# Patient Record
Sex: Female | Born: 1945
Health system: Southern US, Community
[De-identification: ages and names within clinical notes are randomized; demographics above are authoritative.]

## PROBLEM LIST (undated history)

## (undated) DIAGNOSIS — Z86010 Personal history of colonic polyps: Secondary | ICD-10-CM

## (undated) DIAGNOSIS — K760 Fatty (change of) liver, not elsewhere classified: Secondary | ICD-10-CM

## (undated) DIAGNOSIS — E785 Hyperlipidemia, unspecified: Secondary | ICD-10-CM

## (undated) DIAGNOSIS — M810 Age-related osteoporosis without current pathological fracture: Secondary | ICD-10-CM

## (undated) DIAGNOSIS — H269 Unspecified cataract: Secondary | ICD-10-CM

## (undated) DIAGNOSIS — I10 Essential (primary) hypertension: Secondary | ICD-10-CM

## (undated) DIAGNOSIS — K635 Polyp of colon: Secondary | ICD-10-CM

## (undated) HISTORY — PX: TUBAL LIGATION: SHX77

## (undated) HISTORY — PX: CHOLECYSTECTOMY: SHX55

## (undated) HISTORY — DX: Hyperlipidemia, unspecified: E78.5

## (undated) HISTORY — DX: Unspecified cataract: H26.9

## (undated) HISTORY — DX: Personal history of colonic polyps: Z86.010

## (undated) HISTORY — DX: Essential (primary) hypertension: I10

## (undated) HISTORY — DX: Age-related osteoporosis without current pathological fracture: M81.0

## (undated) HISTORY — DX: Polyp of colon: K63.5

## (undated) HISTORY — PX: ABDOMINAL HYSTERECTOMY: SHX81

---
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
## 2001-03-18 ENCOUNTER — Encounter: Payer: Self-pay | Admitting: Gastroenterology

## 2001-03-18 ENCOUNTER — Ambulatory Visit (HOSPITAL_COMMUNITY): Admission: RE | Admit: 2001-03-18 | Discharge: 2001-03-18 | Payer: Self-pay | Admitting: Gastroenterology

## 2010-07-05 ENCOUNTER — Other Ambulatory Visit (HOSPITAL_COMMUNITY): Payer: BC Managed Care – PPO

## 2010-07-06 ENCOUNTER — Encounter (HOSPITAL_COMMUNITY)
Admission: RE | Admit: 2010-07-06 | Discharge: 2010-07-06 | Disposition: A | Discharge status: Home / Self Care | Payer: BC Managed Care – PPO | Source: Ambulatory Visit | Attending: Obstetrics and Gynecology | Admitting: Obstetrics and Gynecology

## 2010-07-06 LAB — BASIC METABOLIC PANEL
BUN: 17 mg/dL (ref 6–23)
CO2: 28 mEq/L (ref 19–32)
Calcium: 9.9 mg/dL (ref 8.4–10.5)
Chloride: 104 mEq/L (ref 96–112)
Creatinine, Ser: 0.89 mg/dL (ref 0.4–1.2)
GFR calc Af Amer: >60 mL/min (ref >60)
GFR calc non Af Amer: >60 mL/min (ref >60)
Glucose, Bld: 99 mg/dL (ref 70–99)
Potassium: 4.2 mEq/L (ref 3.5–5.1)
Sodium: 141 mEq/L (ref 135–145)

## 2010-07-06 LAB — CBC
HCT: 42.7 % (ref 36.0–46.0)
Hemoglobin: 14.1 g/dL (ref 12.0–15.0)
MCH: 29.9 pg (ref 26.0–34.0)
MCHC: 33 g/dL (ref 30.0–36.0)
MCV: 90.5 fL (ref 78.0–100.0)
Platelets: 229 10*3/uL (ref 150–400)
RBC: 4.72 MIL/uL (ref 3.87–5.11)
RDW: 14.5 % (ref 11.5–15.5)
WBC: 6.8 10*3/uL (ref 4.0–10.5)

## 2010-07-06 LAB — SURGICAL PCR SCREEN
MRSA, PCR: NEGATIVE
Staphylococcus aureus: NEGATIVE

## 2010-07-11 ENCOUNTER — Other Ambulatory Visit: Payer: Self-pay | Admitting: Obstetrics and Gynecology

## 2010-07-11 ENCOUNTER — Ambulatory Visit (HOSPITAL_COMMUNITY)
Admission: RE | Admit: 2010-07-11 | Discharge: 2010-07-12 | Disposition: A | Discharge status: Home / Self Care | Payer: BC Managed Care – PPO | Source: Ambulatory Visit | Attending: Obstetrics and Gynecology | Admitting: Obstetrics and Gynecology

## 2010-07-11 DIAGNOSIS — I10 Essential (primary) hypertension: Secondary | ICD-10-CM | POA: Insufficient documentation

## 2010-07-11 DIAGNOSIS — D251 Intramural leiomyoma of uterus: Secondary | ICD-10-CM | POA: Insufficient documentation

## 2010-07-11 DIAGNOSIS — Z01812 Encounter for preprocedural laboratory examination: Secondary | ICD-10-CM | POA: Insufficient documentation

## 2010-07-11 DIAGNOSIS — E7800 Pure hypercholesterolemia, unspecified: Secondary | ICD-10-CM | POA: Insufficient documentation

## 2010-07-11 DIAGNOSIS — N840 Polyp of corpus uteri: Secondary | ICD-10-CM | POA: Insufficient documentation

## 2010-07-11 DIAGNOSIS — N812 Incomplete uterovaginal prolapse: Secondary | ICD-10-CM | POA: Insufficient documentation

## 2010-07-11 DIAGNOSIS — Z01818 Encounter for other preprocedural examination: Secondary | ICD-10-CM | POA: Insufficient documentation

## 2010-07-12 LAB — CBC
HCT: 32.7 % — ABNORMAL LOW (ref 36.0–46.0)
Hemoglobin: 10.6 g/dL — ABNORMAL LOW (ref 12.0–15.0)
MCH: 29 pg (ref 26.0–34.0)
MCHC: 32.4 g/dL (ref 30.0–36.0)
MCV: 89.3 fL (ref 78.0–100.0)
Platelets: 170 10*3/uL (ref 150–400)
RBC: 3.66 MIL/uL — ABNORMAL LOW (ref 3.87–5.11)
RDW: 14.6 % (ref 11.5–15.5)
WBC: 8.9 10*3/uL (ref 4.0–10.5)

## 2010-07-15 NOTE — Op Note (Signed)
NAMEDEVYNE, HAUGER                ACCOUNT NO.:  [id number]  MEDICAL RECORD NO.:  [id number]         PATIENT TYPE:  WINP  LOCATION:  319                           FACILITY:  [facility]  PHYSICIAN:  Odai Wimmer L. Bergen Magner, M.D.DATE OF BIRTH:  Jul 01, 1945  DATE OF PROCEDURE: DATE OF DISCHARGE:                              OPERATIVE REPORT   PREOPERATIVE DIAGNOSES:  Uterine prolapse, cystocele, and rectocele.  POSTOPERATIVE DIAGNOSES:  Uterine prolapse, cystocele, and rectocele.  PROCEDURE:  Total vaginal hysterectomy and anterior and posterior repair.  SURGEON:  Willian Donson L. Vincente Poli, MD.  ASSISTANT:  Dr. Jennette Kettle.  ANESTHESIA:  General.  FINDINGS:  Grade 2 cystocele, grade 2 uterine prolapse, and grade 1 to 2 rectocele.  SPECIMENS:  Uterus and cervix sent to pathology.  ESTIMATED BLOOD LOSS:  200 mL.  COMPLICATIONS:  None.  DESCRIPTION OF PROCEDURE:  The patient was taken to the operating room. She was intubated.  She was prepped and draped in usual sterile fashion. A Foley catheter was inserted.  Exam under anesthesia revealed she had a grade 2 cystocele, grade 2 uterine prolapse, and grade 1 to rectocele. A weighted speculum was placed in the vagina.  The cervix was grasped with a tenaculum.  A circumferential incision was made around the cervix.  The posterior cul-de-sac was entered sharply using Mayo scissors and the anterior cul-de-sac was entered sharply using Metzenbaum scissors.  A weighted speculum was placed in the cul-de-sac using curved Heaney clamps.  The curved Heaney clamp was placed across the uterosacral cardinal ligaments on either side.  Each pedicle was clamped, cut, and suture ligated using 0-Vicryl suture.  We walked our way up the broad ligament staying very snug against the cervix and the uterus.  Each pedicle was clamped, cut, and suture ligated using 0- Vicryl suture.  We then reached the level of the fundus.  The uterus was retroflexed.  The remainder of the  broad ligament clamped on either side using curved Heaney clamps.  The pedicles were secured using a free tie of 0-Vicryl suture.  The ovaries were very high in the pelvis and I inspected then visually they appeared to be very normal.  We were not able to remove them vaginally.  Hemostasis was excellent.  We closed the posterior cuff using 0-Vicryl suture in a running stitch and the posterior two thirds of the cuff was closed completely anterior to posterior using 0-Vicryl suture.  At this point, we could still see that she had a cystocele, so I just proceeded with an anterior repair, of course she had excellent support in the vaginal wall, so she did not need a sacrospinous ligament fixation.  I placed Allis clamps on either side of the vaginal apex and made a midline incision over the anterior vaginal wall and basically dissected the vesicovaginal fascia away from the overlying epithelium with Metzenbaum scissors.  The cystocele was reduced nicely using sharp and blunt dissection.  We then closed and reduced the cystocele with a pursestring suture using 0-Vicryl suture. I then trimmed the redundant vaginal epithelium and closed the incision anterior to posterior with excellent hemostasis with 0-Vicryl suture, and  the suture approximated and met the area of the vaginal cuff with the previous cuff closure.  At this point, we then inspected the posterior vaginal wall and she had a small rectocele.  She also had a little bit to gaping perineum, so I made a V-shaped incision at the introitus and made a midline incision up about half way up the posterior vaginal wall and then dissected the rectovaginal fascia free posteriorly and reduced the rectocele using interrupted 0-Vicryl suture in standard fashion.  We then trimmed the redundant epithelium and closed the vaginal epithelium incision with 0 Vicryl in a running locked stitch. Hemostasis was excellent.  All sponge, lap, and instrument  counts were correct x2.  Vaginal packing with Estrace cream was inserted to the vagina and will be removed in the morning.  Drains, Foley and vaginal packing.  Pathology, uterus and cervix sent to pathology.  EBL 200 mL. Complications, none.  The patient was taken to recovery room and extubated in stable condition.     Darry Kelnhofer L. Vincente Poli, M.D.     Florestine Avers  D:  07/11/2010  T:  07/11/2010  Job:  [id number]  Electronically Signed by Marcelle Overlie M.D. on 07/15/2010 08:20:18 AM

## 2012-07-06 HISTORY — PX: GALLBLADDER SURGERY: SHX652

## 2013-05-27 DIAGNOSIS — Z0000 Encounter for general adult medical examination without abnormal findings: Secondary | ICD-10-CM | POA: Insufficient documentation

## 2013-05-27 DIAGNOSIS — I10 Essential (primary) hypertension: Secondary | ICD-10-CM | POA: Insufficient documentation

## 2013-05-27 DIAGNOSIS — E1159 Type 2 diabetes mellitus with other circulatory complications: Secondary | ICD-10-CM | POA: Insufficient documentation

## 2013-05-29 DIAGNOSIS — G4733 Obstructive sleep apnea (adult) (pediatric): Secondary | ICD-10-CM | POA: Insufficient documentation

## 2013-06-18 DIAGNOSIS — K635 Polyp of colon: Secondary | ICD-10-CM | POA: Insufficient documentation

## 2013-06-18 DIAGNOSIS — K5790 Diverticulosis of intestine, part unspecified, without perforation or abscess without bleeding: Secondary | ICD-10-CM | POA: Insufficient documentation

## 2013-06-18 DIAGNOSIS — Z860101 Personal history of adenomatous and serrated colon polyps: Secondary | ICD-10-CM

## 2013-06-18 DIAGNOSIS — Z86010 Personal history of colonic polyps: Secondary | ICD-10-CM | POA: Insufficient documentation

## 2013-06-18 HISTORY — DX: Diverticulosis of intestine, part unspecified, without perforation or abscess without bleeding: K57.90

## 2013-06-18 HISTORY — DX: Personal history of colonic polyps: Z86.010

## 2013-06-18 HISTORY — DX: Polyp of colon: K63.5

## 2013-06-18 HISTORY — PX: COLONOSCOPY: SHX174

## 2013-06-18 HISTORY — DX: Personal history of adenomatous and serrated colon polyps: Z86.0101

## 2014-07-29 ENCOUNTER — Ambulatory Visit: Payer: BC Managed Care – PPO | Admitting: Internal Medicine

## 2015-06-11 LAB — TSH: TSH: 1.6 u[IU]/mL (ref <=5.90)

## 2015-06-11 LAB — LIPID PANEL
Cholesterol: 191 mg/dL (ref 0–200)
HDL: 53 mg/dL (ref 35–70)
LDL Cholesterol: 124 mg/dL
Triglycerides: 72 mg/dL (ref 40–160)

## 2015-06-11 LAB — HEPATIC FUNCTION PANEL
ALT: 33 U/L (ref 7–35)
AST: 36 U/L — AB (ref 13–35)
Alkaline Phosphatase: 49 U/L (ref 25–125)
Bilirubin, Total: 0.4 mg/dL

## 2015-06-11 LAB — BASIC METABOLIC PANEL
BUN: 20 mg/dL (ref 4–21)
Creatinine: 1 mg/dL (ref <=1.1)
Glucose: 90 mg/dL
Potassium: 4.1 mmol/L (ref 3.4–5.3)
Sodium: 145 mmol/L (ref 137–147)

## 2015-06-11 LAB — CBC AND DIFFERENTIAL
HCT: 39 % (ref 36–46)
Hemoglobin: 13 g/dL (ref 12.0–16.0)
Platelets: 293 10*3/uL (ref 150–399)
WBC: 5.2 10^3/mL

## 2015-07-12 ENCOUNTER — Ambulatory Visit: Payer: BC Managed Care – PPO | Admitting: Internal Medicine

## 2015-08-19 ENCOUNTER — Ambulatory Visit: Payer: BC Managed Care – PPO | Admitting: Internal Medicine

## 2015-09-15 LAB — HM DEXA SCAN

## 2015-09-16 LAB — HM MAMMOGRAPHY

## 2015-10-28 ENCOUNTER — Ambulatory Visit (INDEPENDENT_AMBULATORY_CARE_PROVIDER_SITE_OTHER): Payer: Medicare Other | Admitting: Internal Medicine

## 2015-10-28 ENCOUNTER — Encounter: Payer: Self-pay | Admitting: Internal Medicine

## 2015-10-28 VITALS — BP 134/82 | HR 69 | Temp 97.9°F | Resp 16 | Ht 62.0 in | Wt 134.0 lb

## 2015-10-28 DIAGNOSIS — E785 Hyperlipidemia, unspecified: Secondary | ICD-10-CM | POA: Insufficient documentation

## 2015-10-28 DIAGNOSIS — E559 Vitamin D deficiency, unspecified: Secondary | ICD-10-CM | POA: Diagnosis not present

## 2015-10-28 DIAGNOSIS — K760 Fatty (change of) liver, not elsewhere classified: Secondary | ICD-10-CM | POA: Insufficient documentation

## 2015-10-28 DIAGNOSIS — E1169 Type 2 diabetes mellitus with other specified complication: Secondary | ICD-10-CM | POA: Insufficient documentation

## 2015-10-28 DIAGNOSIS — H269 Unspecified cataract: Secondary | ICD-10-CM

## 2015-10-28 DIAGNOSIS — I10 Essential (primary) hypertension: Secondary | ICD-10-CM

## 2015-10-28 NOTE — Assessment & Plan Note (Signed)
BP well controlled Current regimen effective and well tolerated Continue current medications at current doses cmp done a couple of months ago F/u in one year

## 2015-10-28 NOTE — Patient Instructions (Addendum)
   All other Health Maintenance issues reviewed.   All recommended immunizations and age-appropriate screenings are up-to-date or discussed.  No immunizations administered today.   Medications reviewed and updated.  No changes recommended at this time.   Please followup in one year

## 2015-10-28 NOTE — Assessment & Plan Note (Signed)
lfts done 4 months ago Work on weight loss

## 2015-10-28 NOTE — Assessment & Plan Note (Signed)
Lipid panel controlled Continue current medication 

## 2015-10-28 NOTE — Progress Notes (Signed)
Pre visit review using our clinic review tool, if applicable. No additional management support is needed unless otherwise documented below in the visit note. 

## 2015-10-28 NOTE — Progress Notes (Signed)
Subjective:    Patient ID: Morgan Hancock, female    DOB: Feb 05, 1946, 70 y.o.   MRN: [id number]  HPI She is here to establish with a new pcp.    Three weeks ago she started having neck and right shoulder discomfort after washing her Lucianne Lei with a tall brush.  She saw a chiropractor 5 times.  It is better, but still there.  She saw her old NP and she was given flexeril and naproxen.  She got a full body massage.  She still has some pain.  Her hand is numb on the medial aspect from her wrist to tip of finger.  The pain radiates from the shoulder blade down her arm.  Her neck is not stiff, but it does hurt to bend her neck forward. She is taking the flexeril.  Heat helps.  If she gets in certain positions she will get relief.     Hypertension: She is taking her medication daily. She is compliant with a low sodium diet.  She denies chest pain, palpitations, edema, shortness of breath and regular headaches. She is exercising regularly.  She does not monitor her blood pressure at home.    Hyperlipidemia: She is taking her medication daily. She is compliant with a low fat/cholesterol diet. She is exercising regularly. She denies myalgias.    Medications and allergies reviewed with patient and updated if appropriate.  Patient Active Problem List   Diagnosis Date Noted  . HLD (hyperlipidemia) 10/28/2015  . Avitaminosis D 10/28/2015  . DD (diverticular disease) 06/18/2013  . Colon polyp 06/18/2013  . Obstructive apnea 05/29/2013  . BP (high blood pressure) 05/27/2013    No current outpatient prescriptions on file prior to visit.   No current facility-administered medications on file prior to visit.    Past Medical History  Diagnosis Date  . Hyperlipidemia   . Hypertension     Past Surgical History  Procedure Laterality Date  . Gallbladder surgery  [id number]  . Tonsillectomy  03/12/1952  . Abdominal hysterectomy      Social History   Social History  . Marital Status: Married   Spouse Name: N/A  . Number of Children: N/A  . Years of Education: N/A   Social History Main Topics  . Smoking status: Never Smoker   . Smokeless tobacco: Never Used  . Alcohol Use: No  . Drug Use: No  . Sexual Activity: Not Asked   Other Topics Concern  . None   Social History Narrative  . None    Family History  Problem Relation Age of Onset  . Hyperlipidemia Mother   . Heart disease Father   . Diabetes Father   . Parkinson's disease Father   . Hyperlipidemia Brother   . Hypertension Brother   . Miscarriages / Stillbirths Maternal Grandmother   . Miscarriages / Stillbirths Paternal Grandfather     Review of Systems  Constitutional: Negative for fever, chills, appetite change and fatigue.  Eyes: Negative for visual disturbance.  Respiratory: Negative for cough, shortness of breath and wheezing.   Cardiovascular: Negative for chest pain, palpitations and leg swelling.  Gastrointestinal: Negative for nausea, abdominal pain, diarrhea, constipation and blood in stool.       No gerd  Genitourinary: Negative for dysuria and hematuria.  Musculoskeletal: Positive for myalgias (right arm, upper back). Negative for back pain.  Skin: Negative for color change and rash.       Sees derm annually  Neurological: Negative for dizziness, light-headedness and  headaches.  Psychiatric/Behavioral: Negative for dysphoric mood. The patient is not nervous/anxious.        Objective:   Filed Vitals:   10/28/15 0859  BP: 134/82  Pulse: 69  Temp: 97.9 F (36.6 C)  Resp: 16   Filed Weights   10/28/15 0859  Weight: 134 lb (60.782 kg)   Body mass index is 24.5 kg/(m^2).   Physical Exam Constitutional: She appears well-developed and well-nourished. No distress.  HENT:  Head: Normocephalic and atraumatic.  Right Ear: External ear normal. Normal ear canal and TM Left Ear: External ear normal.  Normal ear canal and TM Mouth/Throat: Oropharynx is clear and moist.  Eyes: Conjunctivae  are normal.  Neck: Neck supple. No tracheal deviation present. No thyromegaly present.  No carotid bruit  Cardiovascular: Normal rate, regular rhythm and normal heart sounds.   No murmur heard.  No edema. Pulmonary/Chest: Effort normal and breath sounds normal. No respiratory distress. She has no wheezes. She has no rales.  Abdominal: Soft. She exhibits no distension. There is no tenderness.  Lymphadenopathy: She has no cervical adenopathy.  Skin: Skin is warm and dry. She is not diaphoretic.  Psychiatric: She has a normal mood and affect. Her behavior is normal.       Assessment & Plan:   See Problem List for Assessment and Plan of chronic medical problems.   F/u annually

## 2015-10-29 ENCOUNTER — Encounter: Payer: Self-pay | Admitting: Internal Medicine

## 2015-10-29 DIAGNOSIS — M8580 Other specified disorders of bone density and structure, unspecified site: Secondary | ICD-10-CM | POA: Insufficient documentation

## 2015-10-29 DIAGNOSIS — M810 Age-related osteoporosis without current pathological fracture: Secondary | ICD-10-CM | POA: Insufficient documentation

## 2015-10-29 HISTORY — DX: Age-related osteoporosis without current pathological fracture: M81.0

## 2015-11-05 ENCOUNTER — Encounter: Payer: Self-pay | Admitting: Internal Medicine

## 2015-11-20 ENCOUNTER — Encounter: Payer: Self-pay | Admitting: Internal Medicine

## 2015-11-20 DIAGNOSIS — N6019 Diffuse cystic mastopathy of unspecified breast: Secondary | ICD-10-CM | POA: Insufficient documentation

## 2015-11-20 HISTORY — DX: Diffuse cystic mastopathy of unspecified breast: N60.19

## 2016-02-08 ENCOUNTER — Encounter: Payer: Self-pay | Admitting: Internal Medicine

## 2016-02-08 ENCOUNTER — Ambulatory Visit (INDEPENDENT_AMBULATORY_CARE_PROVIDER_SITE_OTHER): Payer: Medicare Other | Admitting: Internal Medicine

## 2016-02-08 ENCOUNTER — Other Ambulatory Visit (INDEPENDENT_AMBULATORY_CARE_PROVIDER_SITE_OTHER): Payer: Medicare Other

## 2016-02-08 VITALS — BP 140/38 | HR 75 | Temp 98.0°F | Resp 16 | Wt 143.0 lb

## 2016-02-08 DIAGNOSIS — R1012 Left upper quadrant pain: Secondary | ICD-10-CM

## 2016-02-08 LAB — CBC WITH DIFFERENTIAL/PLATELET
Basophils Absolute: 0.1 10*3/uL (ref 0.0–0.1)
Basophils Relative: 0.7 % (ref 0.0–3.0)
Eosinophils Absolute: 0.5 10*3/uL (ref 0.0–0.7)
Eosinophils Relative: 7 % — ABNORMAL HIGH (ref 0.0–5.0)
HCT: 40.2 % (ref 36.0–46.0)
Hemoglobin: 13.4 g/dL (ref 12.0–15.0)
Lymphocytes Relative: 29.4 % (ref 12.0–46.0)
Lymphs Abs: 2.1 10*3/uL (ref 0.7–4.0)
MCHC: 33.4 g/dL (ref 30.0–36.0)
MCV: 89.2 fl (ref 78.0–100.0)
Monocytes Absolute: 0.6 10*3/uL (ref 0.1–1.0)
Monocytes Relative: 8 % (ref 3.0–12.0)
Neutro Abs: 4 10*3/uL (ref 1.4–7.7)
Neutrophils Relative %: 54.9 % (ref 43.0–77.0)
Platelets: 278 10*3/uL (ref 150.0–400.0)
RBC: 4.5 Mil/uL (ref 3.87–5.11)
RDW: 14.8 % (ref 11.5–15.5)
WBC: 7.2 10*3/uL (ref 4.0–10.5)

## 2016-02-08 MED ORDER — OMEPRAZOLE 20 MG PO CPDR: 20.0000 mg | DELAYED_RELEASE_CAPSULE | Freq: Every day | ORAL | 3 refills | Status: DC

## 2016-02-08 NOTE — Assessment & Plan Note (Signed)
Present for 2 months, getting worse Concern for atypical heartburn, pancreatic disorder Check CBC, CMP, amylase and lipase CT of the abdomen and pelvis ordered Start omeprazole 20 mg daily She will call if her symptoms worsen Further evaluation and referral depending on above

## 2016-02-08 NOTE — Progress Notes (Signed)
Subjective:    Patient ID: Morgan Hancock, female    DOB: 11-Dec-1945, 70 y.o.   MRN: [id number]  HPI She is here for an acute visit.  2 months ago she started experiencing left upper quadrant pain that radiated to her right upper abdomen. She also has had intermittent pain near her right shoulder blade. She has had her gallbladder removed and this pain reminded her of her gallbladder pain. The pain is intermittent. It has gotten worse. For the past 3-4 weeks has been waking her up at night. The pain is worse after eating, but not related to any specific foods. She has been experiencing increased gas. She denies any history of heartburn does not feel like she is having heartburn. She does feel like she has increased gas. Her appetite is slightly decreased, but her weight has not changed.  She denies any urinary symptoms, fevers, chest pain, shortness of breath, cough or wheeze. Her bowel movements have been normal and she denies any blood in the stool.    Medications and allergies reviewed with patient and updated if appropriate.  Patient Active Problem List   Diagnosis Date Noted  . Fibrocystic breast disease 11/20/2015  . Osteopenia 10/29/2015  . HLD (hyperlipidemia) 10/28/2015  . Fatty liver 10/28/2015  . Vitamin D deficiency 10/28/2015  . Cataract 10/28/2015  . DD (diverticular disease) 06/18/2013  . Colon polyp 06/18/2013  . BP (high blood pressure) 05/27/2013    Current Outpatient Prescriptions on File Prior to Visit  Medication Sig Dispense Refill  . aspirin 81 MG tablet Take 81 mg by mouth daily.    Marland Kitchen atenolol (TENORMIN) 50 MG tablet Take 50 mg by mouth daily.    Marland Kitchen CALCIUM-VITAMIN D PO Take 1,200 mg by mouth.    . ergocalciferol (VITAMIN D2) 50000 units capsule Take 50,000 Units by mouth every 30 (thirty) days.    . FENOFIBRATE PO Take 145 mg by mouth daily.    . Omega-3 Fatty Acids (FISH OIL) 1000 MG CAPS Take 1,000 mg by mouth daily.     No current  facility-administered medications on file prior to visit.     Past Medical History:  Diagnosis Date  . Hyperlipidemia   . Hypertension     Past Surgical History:  Procedure Laterality Date  . ABDOMINAL HYSTERECTOMY     prolapse  . GALLBLADDER SURGERY  [id number]  . TONSILLECTOMY  03/12/1952    Social History   Social History  . Marital status: Married    Spouse name: N/A  . Number of children: N/A  . Years of education: N/A   Social History Main Topics  . Smoking status: Never Smoker  . Smokeless tobacco: Never Used  . Alcohol use No  . Drug use: No  . Sexual activity: Not on file   Other Topics Concern  . Not on file   Social History Narrative   Exercise: gardening, mows lawn, [place] [facility] [employer] [facility]    Family History  Problem Relation Age of Onset  . Hyperlipidemia Mother   . Heart disease Father   . Diabetes Father   . Parkinson's disease Father   . Hyperlipidemia Brother   . Hypertension Brother   . Miscarriages / Stillbirths Maternal Grandmother   . Miscarriages / Stillbirths Paternal Grandfather     Review of Systems  Constitutional: Positive for appetite change (less hungry - mild). Negative for fever and unexpected weight change.  Respiratory: Negative for cough, shortness of breath and wheezing.  Cardiovascular: Negative for chest pain, palpitations and leg swelling.  Gastrointestinal: Positive for abdominal pain. Negative for blood in stool, constipation, diarrhea and nausea.       No gerd  Genitourinary: Negative for dysuria and hematuria.  Skin: Negative for rash.  Neurological: Negative for light-headedness and headaches.       Objective:   Vitals:   02/08/16 1612  BP: (!) 140/38  Pulse: 75  Resp: 16  Temp: 98 F (36.7 C)   Filed Weights   02/08/16 1612  Weight: 143 lb (64.9 kg)   Body mass index is 26.16 kg/m.   Physical Exam  Constitutional: She appears well-developed and well-nourished. No distress.  HENT:  Head:  Normocephalic and atraumatic.  Eyes: Conjunctivae are normal.  Cardiovascular: Normal rate, regular rhythm and normal heart sounds.   No murmur heard. Pulmonary/Chest: Effort normal and breath sounds normal. No respiratory distress. She has no wheezes. She has no rales.  Abdominal: Soft. Bowel sounds are normal. She exhibits no distension and no mass. There is tenderness (Left upper quadrant without rebound or guarding). There is no rebound and no guarding.  Musculoskeletal: She exhibits no edema.  Skin: Skin is warm. No rash noted. She is not diaphoretic.          Assessment & Plan:   See Problem List for Assessment and Plan of chronic medical problems.

## 2016-02-08 NOTE — Progress Notes (Signed)
Pre visit review using our clinic review tool, if applicable. No additional management support is needed unless otherwise documented below in the visit note. 

## 2016-02-08 NOTE — Patient Instructions (Signed)
  Test(s) ordered today. Your results will be released to [place] (or called to you) after review, usually within 72hours after test completion. If any changes need to be made, you will be notified at that same time.   Medications reviewed and updated.  Changes include starting omeprazole for your stomach pain.   Your prescription(s) have been submitted to your pharmacy. Please take as directed and contact our office if you believe you are having problem(s) with the medication(s).  A ct scan of your abdomen was ordered - we will cal you to schedule this.

## 2016-02-09 ENCOUNTER — Ambulatory Visit (INDEPENDENT_AMBULATORY_CARE_PROVIDER_SITE_OTHER)
Admission: RE | Admit: 2016-02-09 | Discharge: 2016-02-09 | Disposition: A | Discharge status: Home / Self Care | Payer: Medicare Other | Source: Ambulatory Visit | Attending: Internal Medicine | Admitting: Internal Medicine

## 2016-02-09 ENCOUNTER — Other Ambulatory Visit: Payer: Self-pay | Admitting: Internal Medicine

## 2016-02-09 ENCOUNTER — Telehealth: Payer: Self-pay | Admitting: Internal Medicine

## 2016-02-09 DIAGNOSIS — R1012 Left upper quadrant pain: Secondary | ICD-10-CM | POA: Diagnosis not present

## 2016-02-09 LAB — COMPREHENSIVE METABOLIC PANEL
ALT: 26 U/L (ref 0–35)
AST: 26 U/L (ref 0–37)
Albumin: 4.1 g/dL (ref 3.5–5.2)
Alkaline Phosphatase: 36 U/L — ABNORMAL LOW (ref 39–117)
BUN: 21 mg/dL (ref 6–23)
CO2: 27 mEq/L (ref 19–32)
Calcium: 9.5 mg/dL (ref 8.4–10.5)
Chloride: 105 mEq/L (ref 96–112)
Creatinine, Ser: 0.99 mg/dL (ref 0.40–1.20)
GFR: 58.9 mL/min — ABNORMAL LOW (ref >60.00)
Glucose, Bld: 102 mg/dL — ABNORMAL HIGH (ref 70–99)
Potassium: 4 mEq/L (ref 3.5–5.1)
Sodium: 141 mEq/L (ref 135–145)
Total Bilirubin: 0.3 mg/dL (ref 0.2–1.2)
Total Protein: 7.5 g/dL (ref 6.0–8.3)

## 2016-02-09 LAB — LIPASE: Lipase: 37 U/L (ref 11.0–59.0)

## 2016-02-09 MED ORDER — IOPAMIDOL (ISOVUE-300) INJECTION 61%
100.0000 mL | Freq: Once | INTRAVENOUS | Status: AC | PRN
  Administered 2016-02-09: 100 mL via INTRAVENOUS

## 2016-02-09 NOTE — Telephone Encounter (Signed)
Her blood work is unremarkable and her ct scan showed no cause for her pain, which is good new.  She dose have some plaque build up in her arteries and mild fatty liver.   Has there been any improvement in her pain with the omeprazole?  She may want to try taking one twice a day 30 minutes prior to meals for a week to see if that helps.  If no improvement she needs to let us know.

## 2016-02-10 NOTE — Telephone Encounter (Signed)
LVM for pt to call back.

## 2016-02-10 NOTE — Telephone Encounter (Signed)
Pt called you back. Please call back when you get a chance thanks.

## 2016-02-10 NOTE — Telephone Encounter (Signed)
Spoke with pt, she states she has only taken the Omeprazole one time. She will try for a week and let us know if things are no changes.

## 2016-02-10 NOTE — Telephone Encounter (Signed)
Patient called back again to speak with Lovena Le. Please follow up with after 4pm she asked. Thank you.

## 2016-12-28 LAB — HM MAMMOGRAPHY

## 2017-04-18 ENCOUNTER — Ambulatory Visit: Payer: Medicare Other | Admitting: Internal Medicine

## 2017-04-18 ENCOUNTER — Other Ambulatory Visit (INDEPENDENT_AMBULATORY_CARE_PROVIDER_SITE_OTHER): Payer: Medicare Other

## 2017-04-18 ENCOUNTER — Encounter: Payer: Self-pay | Admitting: Internal Medicine

## 2017-04-18 DIAGNOSIS — R1031 Right lower quadrant pain: Secondary | ICD-10-CM

## 2017-04-18 LAB — CBC WITH DIFFERENTIAL/PLATELET
Basophils Absolute: 0 10*3/uL (ref 0.0–0.1)
Basophils Relative: 0.7 % (ref 0.0–3.0)
Eosinophils Absolute: 0.3 10*3/uL (ref 0.0–0.7)
Eosinophils Relative: 5 % (ref 0.0–5.0)
HCT: 41.4 % (ref 36.0–46.0)
Hemoglobin: 13.7 g/dL (ref 12.0–15.0)
Lymphocytes Relative: 27.5 % (ref 12.0–46.0)
Lymphs Abs: 1.7 10*3/uL (ref 0.7–4.0)
MCHC: 33.2 g/dL (ref 30.0–36.0)
MCV: 90.8 fl (ref 78.0–100.0)
Monocytes Absolute: 0.4 10*3/uL (ref 0.1–1.0)
Monocytes Relative: 7.4 % (ref 3.0–12.0)
Neutro Abs: 3.6 10*3/uL (ref 1.4–7.7)
Neutrophils Relative %: 59.4 % (ref 43.0–77.0)
Platelets: 276 10*3/uL (ref 150.0–400.0)
RBC: 4.56 Mil/uL (ref 3.87–5.11)
RDW: 14.6 % (ref 11.5–15.5)
WBC: 6 10*3/uL (ref 4.0–10.5)

## 2017-04-18 LAB — COMPREHENSIVE METABOLIC PANEL
ALT: 30 U/L (ref 0–35)
AST: 29 U/L (ref 0–37)
Albumin: 4.3 g/dL (ref 3.5–5.2)
Alkaline Phosphatase: 46 U/L (ref 39–117)
BUN: 20 mg/dL (ref 6–23)
CO2: 28 mEq/L (ref 19–32)
Calcium: 9.7 mg/dL (ref 8.4–10.5)
Chloride: 104 mEq/L (ref 96–112)
Creatinine, Ser: 1.05 mg/dL (ref 0.40–1.20)
GFR: 54.85 mL/min — ABNORMAL LOW (ref >60.00)
Glucose, Bld: 109 mg/dL — ABNORMAL HIGH (ref 70–99)
Potassium: 3.8 mEq/L (ref 3.5–5.1)
Sodium: 142 mEq/L (ref 135–145)
Total Bilirubin: 0.6 mg/dL (ref 0.2–1.2)
Total Protein: 7.4 g/dL (ref 6.0–8.3)

## 2017-04-18 LAB — URINALYSIS, ROUTINE W REFLEX MICROSCOPIC
Bilirubin Urine: NEGATIVE
Hgb urine dipstick: NEGATIVE
Ketones, ur: NEGATIVE
Nitrite: NEGATIVE
RBC / HPF: NONE SEEN (ref 0–?)
Specific Gravity, Urine: 1.015 (ref 1.000–1.030)
Total Protein, Urine: NEGATIVE
Urine Glucose: NEGATIVE
Urobilinogen, UA: 0.2 (ref 0.0–1.0)
pH: 8 (ref 5.0–8.0)

## 2017-04-18 LAB — LIPID PANEL
Cholesterol: 187 mg/dL (ref 0–200)
HDL: 46.1 mg/dL (ref >39.00)
LDL Cholesterol: 120 mg/dL — ABNORMAL HIGH (ref 0–99)
NonHDL: 140.83
Total CHOL/HDL Ratio: 4
Triglycerides: 102 mg/dL (ref 0.0–149.0)
VLDL: 20.4 mg/dL (ref 0.0–40.0)

## 2017-04-18 NOTE — Progress Notes (Signed)
Subjective:    Patient ID: Morgan Hancock, female    DOB: November 12, 1945, 71 y.o.   MRN: [id number]  HPI She is here for an acute visit.   Right sided abdominal pain:  It started 3-4 months ago.  It was initially mild and intermittent.  The pain has increased in frequency.  It is more of a burning type pain than a stabbing-like pain.  It is at worst a 3/10.  At night she notices it more if she lays a certain way in bed.   If she lays a certain way it pulls.  At times the pain radiates toward her hip.  She is s/p hysterectomy around 2014 or 2015.  She still has her ovaries.  She has a blood test for ovarian cancer yearly and this year it was normal.  She denies any changes in her bowels, diarrhea, constipation or blood in the stool.  She denies any fevers or chills.  She denies painful urination, blood in the urine or frequent urination.  She denies back pain.  She does not experience any numbness or tingling.    Medications and allergies reviewed with patient and updated if appropriate.  Patient Active Problem List   Diagnosis Date Noted  . LUQ pain 02/08/2016  . Fibrocystic breast disease 11/20/2015  . Osteopenia 10/29/2015  . HLD (hyperlipidemia) 10/28/2015  . Fatty liver 10/28/2015  . Vitamin D deficiency 10/28/2015  . Cataract 10/28/2015  . DD (diverticular disease) 06/18/2013  . Colon polyp 06/18/2013  . BP (high blood pressure) 05/27/2013    Current Outpatient Medications on File Prior to Visit  Medication Sig Dispense Refill  . aspirin 81 MG tablet Take 81 mg by mouth daily.    Marland Kitchen atenolol (TENORMIN) 50 MG tablet Take 50 mg by mouth daily.    Marland Kitchen BIOTIN PO Take by mouth daily.    Marland Kitchen CALCIUM-VITAMIN D PO Take 1,200 mg by mouth.    . ergocalciferol (VITAMIN D2) 50000 units capsule Take 50,000 Units by mouth every 30 (thirty) days.    . FENOFIBRATE PO Take 145 mg by mouth daily.    . Omega-3 Fatty Acids (FISH OIL) 1000 MG CAPS Take 1,000 mg by mouth daily.    Marland Kitchen omeprazole  (PRILOSEC) 20 MG capsule Take 1 capsule (20 mg total) by mouth daily. 30 capsule 3   No current facility-administered medications on file prior to visit.     Past Medical History:  Diagnosis Date  . Hyperlipidemia   . Hypertension     Past Surgical History:  Procedure Laterality Date  . ABDOMINAL HYSTERECTOMY     prolapse  . GALLBLADDER SURGERY  [id number]  . TONSILLECTOMY  03/12/1952    Social History   Socioeconomic History  . Marital status: Married    Spouse name: None  . Number of children: None  . Years of education: None  . Highest education level: None  Social Needs  . Financial resource strain: None  . Food insecurity - worry: None  . Food insecurity - inability: None  . Transportation needs - medical: None  . Transportation needs - non-medical: None  Occupational History  . None  Tobacco Use  . Smoking status: Never Smoker  . Smokeless tobacco: Never Used  Substance and Sexual Activity  . Alcohol use: No  . Drug use: No  . Sexual activity: None  Other Topics Concern  . None  Social History Narrative   Exercise: gardening, mows lawn, [facility] [employer] in fall-  winter    Family History  Problem Relation Age of Onset  . Hyperlipidemia Mother   . Heart disease Father   . Diabetes Father   . Parkinson's disease Father   . Hyperlipidemia Brother   . Hypertension Brother   . Miscarriages / Stillbirths Maternal Grandmother   . Miscarriages / Stillbirths Paternal Grandfather     Review of Systems  Constitutional: Negative for chills and fever.  Gastrointestinal: Positive for abdominal pain (RLQ) and anal bleeding (hemorrhoids). Negative for abdominal distention, blood in stool, constipation, diarrhea and nausea.  Genitourinary: Positive for urgency. Negative for dysuria, frequency and hematuria.  Musculoskeletal: Negative for back pain.  Neurological: Negative for numbness.       Objective:   Vitals:   04/18/17 1038  BP: (!) 144/78  Pulse: 69  Resp:  16  Temp: 98.4 F (36.9 C)  SpO2: 97%   Wt Readings from Last 3 Encounters:  04/18/17 146 lb (66.2 kg)  02/08/16 143 lb (64.9 kg)  10/28/15 134 lb (60.8 kg)   Body mass index is 26.7 kg/m.   Physical Exam    Constitutional: Appears well-developed and well-nourished. No distress.  Abdomen: Soft, nondistended, tenderness with palpation right lower-middle quadrant without rebound or guarding, no tenderness elsewhere, no mass, no hepatosplenomegaly Skin: Skin is warm and dry. Not diaphoretic. No rash or redness Psychiatric: Normal mood and affect. Behavior is normal.      Assessment & Plan:    See Problem List for Assessment and Plan of chronic medical problems.

## 2017-04-18 NOTE — Assessment & Plan Note (Signed)
Intra-abdominal process started 3-4 months ago Initially mild and intermittent, has increased in frequency it is a burning type pain, but still mild No obvious changes or relation to bowel or bladder.  Possibly musculoskeletal, but need to rule out an intra-abdominal process-kidney stone, hydronephrosis, UTI, scar tissue from hysterectomy surgery, ovarian tumor Check CBC, CMP, urinalysis, urine culture CT abdomen and pelvis ordered If above all negative she will need to follow-up with her gynecologist

## 2017-04-18 NOTE — Patient Instructions (Signed)
  Test(s) ordered today. Your results will be released to [place] (or called to you) after review, usually within 72hours after test completion. If any changes need to be made, you will be notified at that same time.   Medications reviewed and updated.  No changes recommended at this time.  A ct scan was ordered.

## 2017-04-19 ENCOUNTER — Encounter: Payer: Self-pay | Admitting: Internal Medicine

## 2017-04-19 LAB — URINE CULTURE
MICRO NUMBER:: 81396881
SPECIMEN QUALITY:: ADEQUATE

## 2017-04-27 ENCOUNTER — Ambulatory Visit (INDEPENDENT_AMBULATORY_CARE_PROVIDER_SITE_OTHER)
Admission: RE | Admit: 2017-04-27 | Discharge: 2017-04-27 | Disposition: A | Discharge status: Home / Self Care | Payer: Medicare Other | Source: Ambulatory Visit | Attending: Internal Medicine | Admitting: Internal Medicine

## 2017-04-27 DIAGNOSIS — R1031 Right lower quadrant pain: Secondary | ICD-10-CM

## 2017-04-27 MED ORDER — IOPAMIDOL (ISOVUE-300) INJECTION 61%
100.0000 mL | Freq: Once | INTRAVENOUS | Status: AC | PRN
  Administered 2017-04-27: 100 mL via INTRAVENOUS

## 2017-04-29 ENCOUNTER — Encounter: Payer: Self-pay | Admitting: Internal Medicine

## 2017-04-29 DIAGNOSIS — I700 Atherosclerosis of aorta: Secondary | ICD-10-CM | POA: Insufficient documentation

## 2017-04-29 HISTORY — DX: Atherosclerosis of aorta: I70.0

## 2017-05-08 DIAGNOSIS — M8580 Other specified disorders of bone density and structure, unspecified site: Secondary | ICD-10-CM

## 2017-05-08 DIAGNOSIS — Z780 Asymptomatic menopausal state: Secondary | ICD-10-CM

## 2017-05-08 HISTORY — DX: Asymptomatic menopausal state: Z78.0

## 2017-05-08 HISTORY — DX: Other specified disorders of bone density and structure, unspecified site: M85.80

## 2017-09-18 NOTE — Progress Notes (Addendum)
Subjective:   Morgan Hancock is a 72 y.o. female who presents for an Initial Medicare Annual Wellness Visit.  Review of Systems    No ROS.  Medicare Wellness Visit. Additional risk factors are reflected in the social history.   Cardiac Risk Factors include: advanced age (>62men, >58 women);dyslipidemia;hypertension Sleep patterns: feels rested on waking, gets up 1 times nightly to void and sleeps 7-8 hours nightly.    Home Safety/Smoke Alarms: Feels safe in home. Smoke alarms in place.  Living environment; residence and Firearm Safety: 1-story house/ trailer, no firearms. Lives with husband, no needs for DME, good support system Seat Belt Safety/Bike Helmet: Wears seat belt.     Objective:    Today's Vitals   09/19/17 1315  BP: 126/62  Pulse: 67  Resp: 16  SpO2: 98%  Weight: 145 lb (65.8 kg)  Height: 5\' 2"  (1.575 m)   Body mass index is 26.52 kg/m.  Advanced Directives 09/19/2017  Does Patient Have a Medical Advance Directive? Yes  Type of Paramedic of [place];Living will  Copy of [place] in Chart? No - copy requested    Current Medications (verified) Outpatient Encounter Medications as of 09/19/2017  Medication Sig  . aspirin 81 MG tablet Take 81 mg by mouth daily.  Marland Kitchen atenolol (TENORMIN) 50 MG tablet Take 50 mg by mouth daily.  Marland Kitchen BIOTIN PO Take by mouth daily.  Marland Kitchen CALCIUM-VITAMIN D PO Take 1,200 mg by mouth.  . ergocalciferol (VITAMIN D2) 50000 units capsule Take 50,000 Units by mouth every 30 (thirty) days.  . FENOFIBRATE PO Take 145 mg by mouth daily.  . Omega-3 Fatty Acids (FISH OIL) 1000 MG CAPS Take 1,000 mg by mouth daily.  . [DISCONTINUED] omeprazole (PRILOSEC) 20 MG capsule Take 1 capsule (20 mg total) by mouth daily. (Patient not taking: Reported on 09/19/2017)   No facility-administered encounter medications on file as of 09/19/2017.     Allergies (verified) Lipitor [atorvastatin]   History: Past Medical  History:  Diagnosis Date  . Hyperlipidemia   . Hypertension    Past Surgical History:  Procedure Laterality Date  . ABDOMINAL HYSTERECTOMY     prolapse  . GALLBLADDER SURGERY  [id number]  . TONSILLECTOMY  03/12/1952   Family History  Problem Relation Age of Onset  . Hyperlipidemia Mother   . Heart disease Father   . Diabetes Father   . Parkinson's disease Father   . Hyperlipidemia Brother   . Hypertension Brother   . Miscarriages / Stillbirths Maternal Grandmother   . Miscarriages / Stillbirths Paternal Grandfather    Social History   Socioeconomic History  . Marital status: Married    Spouse name: Not on file  . Number of children: Not on file  . Years of education: Not on file  . Highest education level: Not on file  Occupational History  . Not on file  Social Needs  . Financial resource strain: Not hard at all  . Food insecurity:    Worry: Never true    Inability: Never true  . Transportation needs:    Medical: No    Non-medical: No  Tobacco Use  . Smoking status: Never Smoker  . Smokeless tobacco: Never Used  Substance and Sexual Activity  . Alcohol use: No  . Drug use: No  . Sexual activity: Not Currently  Lifestyle  . Physical activity:    Days per week: 5 days    Minutes per session: 30 min  .  Stress: Only a little  Relationships  . Social connections:    Talks on phone: More than three times a week    Gets together: More than three times a week    Attends religious service: More than 4 times per year    Active member of club or organization: Yes    Attends meetings of clubs or organizations: More than 4 times per year    Relationship status: Married  Other Topics Concern  . Not on file  Social History Narrative   Exercise: gardening, mows lawn, [facility] [employer] [facility]    Tobacco Counseling Counseling given: Not Answered  Activities of Daily Living In your present state of health, do you have any difficulty performing the following  activities: 09/19/2017  Hearing? N  Vision? N  Difficulty concentrating or making decisions? N  Walking or climbing stairs? N  Dressing or bathing? N  Doing errands, shopping? N  Preparing Food and eating ? N  Using the Toilet? N  In the past six months, have you accidently leaked urine? N  Do you have problems with loss of bowel control? N  Managing your Medications? N  Managing your Finances? N  Housekeeping or managing your Housekeeping? N  Some recent data might be hidden     Immunizations and Health Maintenance Immunization History  Administered Date(s) Administered  . Pneumococcal Conjugate-13 08/31/2014  . Pneumococcal Polysaccharide-23 05/27/2013  . Tdap 05/31/2011   There are no preventive care reminders to display for this patient.   Patient Care Team: Binnie Rail, MD as PCP - General (Internal Medicine)  Indicate any recent Medical Services you may have received from other than [facility] providers in the past year (date may be approximate).     Assessment:   This is a routine wellness examination for [place]. Physical assessment deferred to PCP.  Hearing/Vision screen Hearing Screening Comments: Able to hear conversational tones w/o difficulty. No issues reported.  Passed whisper test Vision Screening Comments: appointment yearly Dr. Clovis Fredrickson  Dietary issues and exercise activities discussed: Current Exercise Habits: Home exercise routine;Structured exercise class, Type of exercise: walking;treadmill, Time (Minutes): 35, Frequency (Times/Week): 4, Weekly Exercise (Minutes/Week): 140  Diet (meal preparation, eat out, water intake, caffeinated beverages, dairy products, fruits and vegetables): in general, a "healthy" diet  , well balanced   Reviewed heart healthy diet, encouraged patient to increase daily water intake.   Goals    . Patient Stated     I want my husband and I to take a train trip and take some RV trips. Enjoy, family and friends.       Depression Screen PHQ 2/9 Scores 09/19/2017 10/28/2015  PHQ - 2 Score 0 0  PHQ- 9 Score 0 -    Fall Risk Fall Risk  09/19/2017 10/28/2015  Falls in the past year? No No   Cognitive Function: MMSE - Mini Mental State Exam 09/19/2017  Orientation to time 5  Orientation to Place 5  Registration 3  Attention/ Calculation 5  Recall 3  Language- name 2 objects 2  Language- repeat 1  Language- follow 3 step command 3  Language- read & follow direction 1  Write a sentence 1  Copy design 1  Total score 30        Screening Tests Health Maintenance  Topic Date Due  . INFLUENZA VACCINE  12/19/2017 (Originally 12/06/2017)  . COLONOSCOPY  06/18/2018  . MAMMOGRAM  12/29/2018  . TETANUS/TDAP  05/30/2021  . DEXA SCAN  Completed  . Hepatitis C Screening  Completed  . PNA vac Low Risk Adult  Completed     Plan:     Continue doing brain stimulating activities (puzzles, reading, adult coloring books, staying active) to keep memory sharp.   Continue to eat heart healthy diet (full of fruits, vegetables, whole grains, lean protein, water--limit salt, fat, and sugar intake) and increase physical activity as tolerated.  I have personally reviewed and noted the following in the patient's chart:   . Medical and social history . Use of alcohol, tobacco or illicit drugs  . Current medications and supplements . Functional ability and status . Nutritional status . Physical activity . Advanced directives . List of other physicians . Vitals . Screenings to include cognitive, depression, and falls . Referrals and appointments  In addition, I have reviewed and discussed with patient certain preventive protocols, quality metrics, and best practice recommendations. A written personalized care plan for preventive services as well as general preventive health recommendations were provided to patient.     Michiel Cowboy, RN   09/19/2017    Medical screening examination/treatment/procedure(s) were  performed by non-physician practitioner and as supervising physician I was immediately available for consultation/collaboration. I agree with above. Binnie Rail, MD

## 2017-09-19 ENCOUNTER — Ambulatory Visit (INDEPENDENT_AMBULATORY_CARE_PROVIDER_SITE_OTHER): Payer: Medicare Other | Admitting: *Deleted

## 2017-09-19 VITALS — BP 126/62 | HR 67 | Resp 16 | Ht 62.0 in | Wt 145.0 lb

## 2017-09-19 DIAGNOSIS — Z0000 Encounter for general adult medical examination without abnormal findings: Secondary | ICD-10-CM

## 2017-09-19 NOTE — Patient Instructions (Signed)
Continue doing brain stimulating activities (puzzles, reading, adult coloring books, staying active) to keep memory sharp.   Continue to eat heart healthy diet (full of fruits, vegetables, whole grains, lean protein, water--limit salt, fat, and sugar intake) and increase physical activity as tolerated.   Morgan Hancock , Thank you for taking time to come for your Medicare Wellness Visit. I appreciate your ongoing commitment to your health goals. Please review the following plan we discussed and let me know if I can assist you in the future.   These are the goals we discussed: Goals    . Patient Stated     I want my husband and I to take a train trip and take some RV trips. Enjoy, family and friends.       This is a list of the screening recommended for you and due dates:  Health Maintenance  Topic Date Due  . Flu Shot  12/19/2017*  . Colon Cancer Screening  06/18/2018  . Mammogram  12/29/2018  . Tetanus Vaccine  05/30/2021  . DEXA scan (bone density measurement)  Completed  .  Hepatitis C: One time screening is recommended by Center for Disease Control  (CDC) for  adults born from [birth year] through 1965.   Completed  . Pneumonia vaccines  Completed  *Topic was postponed. The date shown is not the original due date.   Health Maintenance, Female Adopting a healthy lifestyle and getting preventive care can go a long way to promote health and wellness. Talk with your health care provider about what schedule of regular examinations is right for you. This is a good chance for you to check in with your provider about disease prevention and staying healthy. In between checkups, there are plenty of things you can do on your own. Experts have done a lot of research about which lifestyle changes and preventive measures are most likely to keep you healthy. Ask your health care provider for more information. Weight and diet Eat a healthy diet  Be sure to include plenty of vegetables, fruits, low-fat dairy  products, and lean protein.  Do not eat a lot of foods high in solid fats, added sugars, or salt.  Get regular exercise. This is one of the most important things you can do for your health. ? Most adults should exercise for at least 150 minutes each week. The exercise should increase your heart rate and make you sweat (moderate-intensity exercise). ? Most adults should also do strengthening exercises at least twice a week. This is in addition to the moderate-intensity exercise.  Maintain a healthy weight  Body mass index (BMI) is a measurement that can be used to identify possible weight problems. It estimates body fat based on height and weight. Your health care provider can help determine your BMI and help you achieve or maintain a healthy weight.  For females 58 years of age and older: ? A BMI below 18.5 is considered underweight. ? A BMI of 18.5 to 24.9 is normal. ? A BMI of 25 to 29.9 is considered overweight. ? A BMI of 30 and above is considered obese.  Watch levels of cholesterol and blood lipids  You should start having your blood tested for lipids and cholesterol at 72 years of age, then have this test every 5 years.  You may need to have your cholesterol levels checked more often if: ? Your lipid or cholesterol levels are high. ? You are older than 72 years of age. ? You are at high  risk for heart disease.  Cancer screening Lung Cancer  Lung cancer screening is recommended for adults 25-24 years old who are at high risk for lung cancer because of a history of smoking.  A yearly low-dose CT scan of the lungs is recommended for people who: ? Currently smoke. ? Have quit within the past 15 years. ? Have at least a 30-pack-year history of smoking. A pack year is smoking an average of one pack of cigarettes a day for 1 year.  Yearly screening should continue until it has been 15 years since you quit.  Yearly screening should stop if you develop a health problem that would  prevent you from having lung cancer treatment.  Breast Cancer  Practice breast self-awareness. This means understanding how your breasts normally appear and feel.  It also means doing regular breast self-exams. Let your health care provider know about any changes, no matter how small.  If you are in your 20s or 30s, you should have a clinical breast exam (CBE) by a health care provider every 1-3 years as part of a regular health exam.  If you are 7 or older, have a CBE every year. Also consider having a breast X-ray (mammogram) every year.  If you have a family history of breast cancer, talk to your health care provider about genetic screening.  If you are at high risk for breast cancer, talk to your health care provider about having an MRI and a mammogram every year.  Breast cancer gene (BRCA) assessment is recommended for women who have family members with BRCA-related cancers. BRCA-related cancers include: ? Breast. ? Ovarian. ? Tubal. ? Peritoneal cancers.  Results of the assessment will determine the need for genetic counseling and BRCA1 and BRCA2 testing.  Cervical Cancer Your health care provider may recommend that you be screened regularly for cancer of the pelvic organs (ovaries, uterus, and vagina). This screening involves a pelvic examination, including checking for microscopic changes to the surface of your cervix (Pap test). You may be encouraged to have this screening done every 3 years, beginning at age 37.  For women ages 90-65, health care providers may recommend pelvic exams and Pap testing every 3 years, or they may recommend the Pap and pelvic exam, combined with testing for human papilloma virus (HPV), every 5 years. Some types of HPV increase your risk of cervical cancer. Testing for HPV may also be done on women of any age with unclear Pap test results.  Other health care providers may not recommend any screening for nonpregnant women who are considered low risk  for pelvic cancer and who do not have symptoms. Ask your health care provider if a screening pelvic exam is right for you.  If you have had past treatment for cervical cancer or a condition that could lead to cancer, you need Pap tests and screening for cancer for at least 20 years after your treatment. If Pap tests have been discontinued, your risk factors (such as having a new sexual partner) need to be reassessed to determine if screening should resume. Some women have medical problems that increase the chance of getting cervical cancer. In these cases, your health care provider may recommend more frequent screening and Pap tests.  Colorectal Cancer  This type of cancer can be detected and often prevented.  Routine colorectal cancer screening usually begins at 72 years of age and continues through 72 years of age.  Your health care provider may recommend screening at an earlier age  if you have risk factors for colon cancer.  Your health care provider may also recommend using home test kits to check for hidden blood in the stool.  A small camera at the end of a tube can be used to examine your colon directly (sigmoidoscopy or colonoscopy). This is done to check for the earliest forms of colorectal cancer.  Routine screening usually begins at age 74.  Direct examination of the colon should be repeated every 5-10 years through 72 years of age. However, you may need to be screened more often if early forms of precancerous polyps or small growths are found.  Skin Cancer  Check your skin from head to toe regularly.  Tell your health care provider about any new moles or changes in moles, especially if there is a change in a mole's shape or color.  Also tell your health care provider if you have a mole that is larger than the size of a pencil eraser.  Always use sunscreen. Apply sunscreen liberally and repeatedly throughout the day.  Protect yourself by wearing long sleeves, pants, a  wide-brimmed hat, and sunglasses whenever you are outside.  Heart disease, diabetes, and high blood pressure  High blood pressure causes heart disease and increases the risk of stroke. High blood pressure is more likely to develop in: ? People who have blood pressure in the high end of the normal range (130-139/85-89 mm Hg). ? People who are overweight or obese. ? People who are African American.  If you are 53-68 years of age, have your blood pressure checked every 3-5 years. If you are 76 years of age or older, have your blood pressure checked every year. You should have your blood pressure measured twice-once when you are at a hospital or clinic, and once when you are not at a hospital or clinic. Record the average of the two measurements. To check your blood pressure when you are not at a hospital or clinic, you can use: ? An automated blood pressure machine at a pharmacy. ? A home blood pressure monitor.  If you are between 68 years and 104 years old, ask your health care provider if you should take aspirin to prevent strokes.  Have regular diabetes screenings. This involves taking a blood sample to check your fasting blood sugar level. ? If you are at a normal weight and have a low risk for diabetes, have this test once every three years after 72 years of age. ? If you are overweight and have a high risk for diabetes, consider being tested at a younger age or more often. Preventing infection Hepatitis B  If you have a higher risk for hepatitis B, you should be screened for this virus. You are considered at high risk for hepatitis B if: ? You were born in a country where hepatitis B is common. Ask your health care provider which countries are considered high risk. ? Your parents were born in a high-risk country, and you have not been immunized against hepatitis B (hepatitis B vaccine). ? You have HIV or AIDS. ? You use needles to inject street drugs. ? You live with someone who has  hepatitis B. ? You have had sex with someone who has hepatitis B. ? You get hemodialysis treatment. ? You take certain medicines for conditions, including cancer, organ transplantation, and autoimmune conditions.  Hepatitis C  Blood testing is recommended for: ? Everyone born from [birth year] through 1965. ? Anyone with known risk factors for hepatitis C.  Sexually transmitted infections (STIs)  You should be screened for sexually transmitted infections (STIs) including gonorrhea and chlamydia if: ? You are sexually active and are younger than 72 years of age. ? You are older than 72 years of age and your health care provider tells you that you are at risk for this type of infection. ? Your sexual activity has changed since you were last screened and you are at an increased risk for chlamydia or gonorrhea. Ask your health care provider if you are at risk.  If you do not have HIV, but are at risk, it may be recommended that you take a prescription medicine daily to prevent HIV infection. This is called pre-exposure prophylaxis (PrEP). You are considered at risk if: ? You are sexually active and do not regularly use condoms or know the HIV status of your partner(s). ? You take drugs by injection. ? You are sexually active with a partner who has HIV.  Talk with your health care provider about whether you are at high risk of being infected with HIV. If you choose to begin PrEP, you should first be tested for HIV. You should then be tested every 3 months for as long as you are taking PrEP. Pregnancy  If you are premenopausal and you may become pregnant, ask your health care provider about preconception counseling.  If you may become pregnant, take 400 to 800 micrograms (mcg) of folic acid every day.  If you want to prevent pregnancy, talk to your health care provider about birth control (contraception). Osteoporosis and menopause  Osteoporosis is a disease in which the bones lose minerals and  strength with aging. This can result in serious bone fractures. Your risk for osteoporosis can be identified using a bone density scan.  If you are 66 years of age or older, or if you are at risk for osteoporosis and fractures, ask your health care provider if you should be screened.  Ask your health care provider whether you should take a calcium or vitamin D supplement to lower your risk for osteoporosis.  Menopause may have certain physical symptoms and risks.  Hormone replacement therapy may reduce some of these symptoms and risks. Talk to your health care provider about whether hormone replacement therapy is right for you. Follow these instructions at home:  Schedule regular health, dental, and eye exams.  Stay current with your immunizations.  Do not use any tobacco products including cigarettes, chewing tobacco, or electronic cigarettes.  If you are pregnant, do not drink alcohol.  If you are breastfeeding, limit how much and how often you drink alcohol.  Limit alcohol intake to no more than 1 drink per day for nonpregnant women. One drink equals 12 ounces of beer, 5 ounces of [place], or 1 ounces of hard liquor.  Do not use street drugs.  Do not share needles.  Ask your health care provider for help if you need support or information about quitting drugs.  Tell your health care provider if you often feel depressed.  Tell your health care provider if you have ever been abused or do not feel safe at home. This information is not intended to replace advice given to you by your health care provider. Make sure you discuss any questions you have with your health care provider. Document Released: 11/07/2010 Document Revised: 09/30/2015 Document Reviewed: 01/26/2015 Elsevier Interactive Patient Education  [employer].

## 2017-12-17 NOTE — Progress Notes (Signed)
Subjective:    Patient ID: Morgan Hancock, female    DOB: 1946/05/05, 72 y.o.   MRN: [id number]  HPI She is here for a physical exam.   RLQ pain:  She saw her gyn and she told her it was likely tendinitis.  She recommended pelvic PT.  It has gotten better and she hopes to avoid PT.    Hair thinning on top of head.  She is taking biotin, which as helped her nails but not her hair.    Medications and allergies reviewed with patient and updated if appropriate.  Patient Active Problem List   Diagnosis Date Noted  . Atherosclerosis of aorta ([place]) 04/29/2017  . Right lower quadrant abdominal pain 04/18/2017  . Fibrocystic breast disease 11/20/2015  . Osteopenia 10/29/2015  . HLD (hyperlipidemia) 10/28/2015  . Fatty liver 10/28/2015  . Vitamin D deficiency 10/28/2015  . Cataract 10/28/2015  . DD (diverticular disease) 06/18/2013  . Colon polyp 06/18/2013  . BP (high blood pressure) 05/27/2013    Current Outpatient Medications on File Prior to Visit  Medication Sig Dispense Refill  . aspirin 81 MG tablet Take 81 mg by mouth daily.    Marland Kitchen atenolol (TENORMIN) 50 MG tablet Take 50 mg by mouth daily.    Marland Kitchen BIOTIN PO Take by mouth daily.    Marland Kitchen CALCIUM-VITAMIN D PO Take 1,200 mg by mouth.    . ergocalciferol (VITAMIN D2) 50000 units capsule Take 50,000 Units by mouth every 30 (thirty) days.    . FENOFIBRATE PO Take 145 mg by mouth daily.    . Omega-3 Fatty Acids (FISH OIL) 1000 MG CAPS Take 1,000 mg by mouth daily.     No current facility-administered medications on file prior to visit.     Past Medical History:  Diagnosis Date  . Hyperlipidemia   . Hypertension     Past Surgical History:  Procedure Laterality Date  . ABDOMINAL HYSTERECTOMY     prolapse  . GALLBLADDER SURGERY  [id number]  . TONSILLECTOMY  03/12/1952    Social History   Socioeconomic History  . Marital status: Married    Spouse name: Not on file  . Number of children: Not on file  . Years of education: Not  on file  . Highest education level: Not on file  Occupational History  . Not on file  Social Needs  . Financial resource strain: Not hard at all  . Food insecurity:    Worry: Never true    Inability: Never true  . Transportation needs:    Medical: No    Non-medical: No  Tobacco Use  . Smoking status: Never Smoker  . Smokeless tobacco: Never Used  Substance and Sexual Activity  . Alcohol use: No  . Drug use: No  . Sexual activity: Not Currently  Lifestyle  . Physical activity:    Days per week: 5 days    Minutes per session: 30 min  . Stress: Only a little  Relationships  . Social connections:    Talks on phone: More than three times a week    Gets together: More than three times a week    Attends religious service: More than 4 times per year    Active member of club or organization: Yes    Attends meetings of clubs or organizations: More than 4 times per year    Relationship status: Married  Other Topics Concern  . Not on file  Social History Narrative   Exercise: gardening, mows  lawn, [facility] [employer] [facility]    Family History  Problem Relation Age of Onset  . Hyperlipidemia Mother   . Heart disease Father   . Diabetes Father   . Parkinson's disease Father   . Hyperlipidemia Brother   . Hypertension Brother   . Miscarriages / Stillbirths Maternal Grandmother   . Miscarriages / Stillbirths Paternal Grandfather     Review of Systems  Constitutional: Negative for chills and fever.  Eyes: Negative for visual disturbance.  Respiratory: Negative for cough, shortness of breath and wheezing.   Cardiovascular: Negative for chest pain, palpitations and leg swelling.  Gastrointestinal: Positive for abdominal pain (RLQ - intermittent, tendinitis) and anal bleeding (occ, hemorroidal). Negative for blood in stool, constipation, diarrhea and nausea.       No gerd  Genitourinary: Negative for dysuria and hematuria.  Musculoskeletal: Positive for arthralgias (mild OA in  hands). Negative for back pain.  Skin: Negative for color change and rash.  Neurological: Positive for dizziness (intermittent). Negative for light-headedness and headaches.  Psychiatric/Behavioral: Negative for dysphoric mood and sleep disturbance. The patient is not nervous/anxious.        Objective:   Vitals:   12/18/17 1333  BP: (!) 146/88  Pulse: (!) 54  SpO2: 96%   Filed Weights   12/18/17 1333  Weight: 138 lb (62.6 kg)   Body mass index is 25.24 kg/m.  BP Readings from Last 3 Encounters:  12/18/17 (!) 146/88  09/19/17 126/62  04/18/17 (!) 144/78    Wt Readings from Last 3 Encounters:  12/18/17 138 lb (62.6 kg)  09/19/17 145 lb (65.8 kg)  04/18/17 146 lb (66.2 kg)     Physical Exam Constitutional: She appears well-developed and well-nourished. No distress.  HENT:  Head: Normocephalic and atraumatic.  Right Ear: External ear normal. Normal ear canal and TM Left Ear: External ear normal.  Normal ear canal and TM Mouth/Throat: Oropharynx is clear and moist.  Eyes: Conjunctivae and EOM are normal.  Neck: Neck supple. No tracheal deviation present. No thyromegaly present.  No carotid bruit  Cardiovascular: Normal rate, regular rhythm and normal heart sounds.   No murmur heard.  No edema. Pulmonary/Chest: Effort normal and breath sounds normal. No respiratory distress. She has no wheezes. She has no rales.  Breast: deferred to Gyn Abdominal: Soft. She exhibits no distension. There is no tenderness.  Lymphadenopathy: She has no cervical adenopathy.  Skin: Skin is warm and dry. She is not diaphoretic.  Psychiatric: She has a normal mood and affect. Her behavior is normal.        Assessment & Plan:   Physical exam: Screening blood work  ordered Immunizations   Discussed shingrix, others up to date Colonoscopy   Up to date  Mammogram    Up to date  Gyn  Up to date  Dexa   Up to date  Eye exams   Up to date  EKG   None on file -  EKG today given htn - EKG  shows Sinus Brady at 56 bpm, RSR, normal EKG - no prior on file for comparison Exercise  [facility], she is trying to walk;  Active with yard work -  Stressed regular exercise Weight  BMI normal Skin   No concerns, sees derm regularly Substance abuse   none  See Problem List for Assessment and Plan of chronic medical problems.    FU in one year

## 2017-12-17 NOTE — Patient Instructions (Addendum)
Test(s) ordered today. Your results will be released to [place] (or called to you) after review, usually within 72hours after test completion. If any changes need to be made, you will be notified at that same time.  All other Health Maintenance issues reviewed.   All recommended immunizations and age-appropriate screenings are up-to-date or discussed.  No immunizations administered today.   Medications reviewed and updated. No changes recommended at this time.  Your prescription(s) have been submitted to your pharmacy. Please take as directed and contact our office if you believe you are having problem(s) with the medication(s).  An EKG was done.   Please followup in one year   Health Maintenance, Female Adopting a healthy lifestyle and getting preventive care can go a long way to promote health and wellness. Talk with your health care provider about what schedule of regular examinations is right for you. This is a good chance for you to check in with your provider about disease prevention and staying healthy. In between checkups, there are plenty of things you can do on your own. Experts have done a lot of research about which lifestyle changes and preventive measures are most likely to keep you healthy. Ask your health care provider for more information. Weight and diet Eat a healthy diet  Be sure to include plenty of vegetables, fruits, low-fat dairy products, and lean protein.  Do not eat a lot of foods high in solid fats, added sugars, or salt.  Get regular exercise. This is one of the most important things you can do for your health. ? Most adults should exercise for at least 150 minutes each week. The exercise should increase your heart rate and make you sweat (moderate-intensity exercise). ? Most adults should also do strengthening exercises at least twice a week. This is in addition to the moderate-intensity exercise.  Maintain a healthy weight  Body mass index (BMI) is a  measurement that can be used to identify possible weight problems. It estimates body fat based on height and weight. Your health care provider can help determine your BMI and help you achieve or maintain a healthy weight.  For females 46 years of age and older: ? A BMI below 18.5 is considered underweight. ? A BMI of 18.5 to 24.9 is normal. ? A BMI of 25 to 29.9 is considered overweight. ? A BMI of 30 and above is considered obese.  Watch levels of cholesterol and blood lipids  You should start having your blood tested for lipids and cholesterol at 72 years of age, then have this test every 5 years.  You may need to have your cholesterol levels checked more often if: ? Your lipid or cholesterol levels are high. ? You are older than 72 years of age. ? You are at high risk for heart disease.  Cancer screening Lung Cancer  Lung cancer screening is recommended for adults 47-52 years old who are at high risk for lung cancer because of a history of smoking.  A yearly low-dose CT scan of the lungs is recommended for people who: ? Currently smoke. ? Have quit within the past 15 years. ? Have at least a 30-pack-year history of smoking. A pack year is smoking an average of one pack of cigarettes a day for 1 year.  Yearly screening should continue until it has been 15 years since you quit.  Yearly screening should stop if you develop a health problem that would prevent you from having lung cancer treatment.  Breast Cancer  Practice breast self-awareness. This means understanding how your breasts normally appear and feel.  It also means doing regular breast self-exams. Let your health care provider know about any changes, no matter how small.  If you are in your 20s or 30s, you should have a clinical breast exam (CBE) by a health care provider every 1-3 years as part of a regular health exam.  If you are 40 or older, have a CBE every year. Also consider having a breast X-ray (mammogram)  every year.  If you have a family history of breast cancer, talk to your health care provider about genetic screening.  If you are at high risk for breast cancer, talk to your health care provider about having an MRI and a mammogram every year.  Breast cancer gene (BRCA) assessment is recommended for women who have family members with BRCA-related cancers. BRCA-related cancers include: ? Breast. ? Ovarian. ? Tubal. ? Peritoneal cancers.  Results of the assessment will determine the need for genetic counseling and BRCA1 and BRCA2 testing.  Cervical Cancer Your health care provider may recommend that you be screened regularly for cancer of the pelvic organs (ovaries, uterus, and vagina). This screening involves a pelvic examination, including checking for microscopic changes to the surface of your cervix (Pap test). You may be encouraged to have this screening done every 3 years, beginning at age 21.  For women ages 30-65, health care providers may recommend pelvic exams and Pap testing every 3 years, or they may recommend the Pap and pelvic exam, combined with testing for human papilloma virus (HPV), every 5 years. Some types of HPV increase your risk of cervical cancer. Testing for HPV may also be done on women of any age with unclear Pap test results.  Other health care providers may not recommend any screening for nonpregnant women who are considered low risk for pelvic cancer and who do not have symptoms. Ask your health care provider if a screening pelvic exam is right for you.  If you have had past treatment for cervical cancer or a condition that could lead to cancer, you need Pap tests and screening for cancer for at least 20 years after your treatment. If Pap tests have been discontinued, your risk factors (such as having a new sexual partner) need to be reassessed to determine if screening should resume. Some women have medical problems that increase the chance of getting cervical  cancer. In these cases, your health care provider may recommend more frequent screening and Pap tests.  Colorectal Cancer  This type of cancer can be detected and often prevented.  Routine colorectal cancer screening usually begins at 72 years of age and continues through 72 years of age.  Your health care provider may recommend screening at an earlier age if you have risk factors for colon cancer.  Your health care provider may also recommend using home test kits to check for hidden blood in the stool.  A small camera at the end of a tube can be used to examine your colon directly (sigmoidoscopy or colonoscopy). This is done to check for the earliest forms of colorectal cancer.  Routine screening usually begins at age 50.  Direct examination of the colon should be repeated every 5-10 years through 72 years of age. However, you may need to be screened more often if early forms of precancerous polyps or small growths are found.  Skin Cancer  Check your skin from head to toe regularly.  Tell your health care   provider about any new moles or changes in moles, especially if there is a change in a mole's shape or color.  Also tell your health care provider if you have a mole that is larger than the size of a pencil eraser.  Always use sunscreen. Apply sunscreen liberally and repeatedly throughout the day.  Protect yourself by wearing long sleeves, pants, a wide-brimmed hat, and sunglasses whenever you are outside.  Heart disease, diabetes, and high blood pressure  High blood pressure causes heart disease and increases the risk of stroke. High blood pressure is more likely to develop in: ? People who have blood pressure in the high end of the normal range (130-139/85-89 mm Hg). ? People who are overweight or obese. ? People who are African American.  If you are 20-47 years of age, have your blood pressure checked every 3-5 years. If you are 23 years of age or older, have your blood  pressure checked every year. You should have your blood pressure measured twice-once when you are at a hospital or clinic, and once when you are not at a hospital or clinic. Record the average of the two measurements. To check your blood pressure when you are not at a hospital or clinic, you can use: ? An automated blood pressure machine at a pharmacy. ? A home blood pressure monitor.  If you are between 32 years and 42 years old, ask your health care provider if you should take aspirin to prevent strokes.  Have regular diabetes screenings. This involves taking a blood sample to check your fasting blood sugar level. ? If you are at a normal weight and have a low risk for diabetes, have this test once every three years after 72 years of age. ? If you are overweight and have a high risk for diabetes, consider being tested at a younger age or more often. Preventing infection Hepatitis B  If you have a higher risk for hepatitis B, you should be screened for this virus. You are considered at high risk for hepatitis B if: ? You were born in a country where hepatitis B is common. Ask your health care provider which countries are considered high risk. ? Your parents were born in a high-risk country, and you have not been immunized against hepatitis B (hepatitis B vaccine). ? You have HIV or AIDS. ? You use needles to inject street drugs. ? You live with someone who has hepatitis B. ? You have had sex with someone who has hepatitis B. ? You get hemodialysis treatment. ? You take certain medicines for conditions, including cancer, organ transplantation, and autoimmune conditions.  Hepatitis C  Blood testing is recommended for: ? Everyone born from [birth year] through 1965. ? Anyone with known risk factors for hepatitis C.  Sexually transmitted infections (STIs)  You should be screened for sexually transmitted infections (STIs) including gonorrhea and chlamydia if: ? You are sexually active and are  younger than 72 years of age. ? You are older than 72 years of age and your health care provider tells you that you are at risk for this type of infection. ? Your sexual activity has changed since you were last screened and you are at an increased risk for chlamydia or gonorrhea. Ask your health care provider if you are at risk.  If you do not have HIV, but are at risk, it may be recommended that you take a prescription medicine daily to prevent HIV infection. This is called pre-exposure prophylaxis (PrEP). You  are considered at risk if: ? You are sexually active and do not regularly use condoms or know the HIV status of your partner(s). ? You take drugs by injection. ? You are sexually active with a partner who has HIV.  Talk with your health care provider about whether you are at high risk of being infected with HIV. If you choose to begin PrEP, you should first be tested for HIV. You should then be tested every 3 months for as long as you are taking PrEP. Pregnancy  If you are premenopausal and you may become pregnant, ask your health care provider about preconception counseling.  If you may become pregnant, take 400 to 800 micrograms (mcg) of folic acid every day.  If you want to prevent pregnancy, talk to your health care provider about birth control (contraception). Osteoporosis and menopause  Osteoporosis is a disease in which the bones lose minerals and strength with aging. This can result in serious bone fractures. Your risk for osteoporosis can be identified using a bone density scan.  If you are 65 years of age or older, or if you are at risk for osteoporosis and fractures, ask your health care provider if you should be screened.  Ask your health care provider whether you should take a calcium or vitamin D supplement to lower your risk for osteoporosis.  Menopause may have certain physical symptoms and risks.  Hormone replacement therapy may reduce some of these symptoms and  risks. Talk to your health care provider about whether hormone replacement therapy is right for you. Follow these instructions at home:  Schedule regular health, dental, and eye exams.  Stay current with your immunizations.  Do not use any tobacco products including cigarettes, chewing tobacco, or electronic cigarettes.  If you are pregnant, do not drink alcohol.  If you are breastfeeding, limit how much and how often you drink alcohol.  Limit alcohol intake to no more than 1 drink per day for nonpregnant women. One drink equals 12 ounces of beer, 5 ounces of wine, or 1 ounces of hard liquor.  Do not use street drugs.  Do not share needles.  Ask your health care provider for help if you need support or information about quitting drugs.  Tell your health care provider if you often feel depressed.  Tell your health care provider if you have ever been abused or do not feel safe at home. This information is not intended to replace advice given to you by your health care provider. Make sure you discuss any questions you have with your health care provider. Document Released: 11/07/2010 Document Revised: 09/30/2015 Document Reviewed: 01/26/2015 Elsevier Interactive Patient Education  2018 [employer]  

## 2017-12-18 ENCOUNTER — Ambulatory Visit (INDEPENDENT_AMBULATORY_CARE_PROVIDER_SITE_OTHER): Payer: Medicare Other | Admitting: Internal Medicine

## 2017-12-18 ENCOUNTER — Other Ambulatory Visit (INDEPENDENT_AMBULATORY_CARE_PROVIDER_SITE_OTHER): Payer: Medicare Other

## 2017-12-18 ENCOUNTER — Encounter: Payer: Self-pay | Admitting: Internal Medicine

## 2017-12-18 VITALS — BP 146/88 | HR 54 | Ht 62.0 in | Wt 138.0 lb

## 2017-12-18 DIAGNOSIS — E559 Vitamin D deficiency, unspecified: Secondary | ICD-10-CM

## 2017-12-18 DIAGNOSIS — R1031 Right lower quadrant pain: Secondary | ICD-10-CM

## 2017-12-18 DIAGNOSIS — M85859 Other specified disorders of bone density and structure, unspecified thigh: Secondary | ICD-10-CM

## 2017-12-18 DIAGNOSIS — Z0000 Encounter for general adult medical examination without abnormal findings: Secondary | ICD-10-CM | POA: Diagnosis not present

## 2017-12-18 DIAGNOSIS — R739 Hyperglycemia, unspecified: Secondary | ICD-10-CM

## 2017-12-18 DIAGNOSIS — L659 Nonscarring hair loss, unspecified: Secondary | ICD-10-CM

## 2017-12-18 DIAGNOSIS — I10 Essential (primary) hypertension: Secondary | ICD-10-CM

## 2017-12-18 DIAGNOSIS — E782 Mixed hyperlipidemia: Secondary | ICD-10-CM

## 2017-12-18 DIAGNOSIS — I700 Atherosclerosis of aorta: Secondary | ICD-10-CM

## 2017-12-18 HISTORY — DX: Nonscarring hair loss, unspecified: L65.9

## 2017-12-18 LAB — LIPID PANEL
Cholesterol: 174 mg/dL (ref 0–200)
HDL: 44.3 mg/dL (ref >39.00)
LDL Cholesterol: 112 mg/dL — ABNORMAL HIGH (ref 0–99)
NonHDL: 129.63
Total CHOL/HDL Ratio: 4
Triglycerides: 88 mg/dL (ref 0.0–149.0)
VLDL: 17.6 mg/dL (ref 0.0–40.0)

## 2017-12-18 LAB — CBC WITH DIFFERENTIAL/PLATELET
Basophils Absolute: 0.1 10*3/uL (ref 0.0–0.1)
Basophils Relative: 1 % (ref 0.0–3.0)
Eosinophils Absolute: 0.2 10*3/uL (ref 0.0–0.7)
Eosinophils Relative: 4.2 % (ref 0.0–5.0)
HCT: 40.2 % (ref 36.0–46.0)
Hemoglobin: 13.5 g/dL (ref 12.0–15.0)
Lymphocytes Relative: 29.7 % (ref 12.0–46.0)
Lymphs Abs: 1.7 10*3/uL (ref 0.7–4.0)
MCHC: 33.7 g/dL (ref 30.0–36.0)
MCV: 89.7 fl (ref 78.0–100.0)
Monocytes Absolute: 0.4 10*3/uL (ref 0.1–1.0)
Monocytes Relative: 6.2 % (ref 3.0–12.0)
Neutro Abs: 3.4 10*3/uL (ref 1.4–7.7)
Neutrophils Relative %: 58.9 % (ref 43.0–77.0)
Platelets: 219 10*3/uL (ref 150.0–400.0)
RBC: 4.48 Mil/uL (ref 3.87–5.11)
RDW: 14.3 % (ref 11.5–15.5)
WBC: 5.8 10*3/uL (ref 4.0–10.5)

## 2017-12-18 LAB — COMPREHENSIVE METABOLIC PANEL
ALT: 31 U/L (ref 0–35)
AST: 40 U/L — ABNORMAL HIGH (ref 0–37)
Albumin: 4.4 g/dL (ref 3.5–5.2)
Alkaline Phosphatase: 44 U/L (ref 39–117)
BUN: 21 mg/dL (ref 6–23)
CO2: 29 mEq/L (ref 19–32)
Calcium: 10.6 mg/dL — ABNORMAL HIGH (ref 8.4–10.5)
Chloride: 102 mEq/L (ref 96–112)
Creatinine, Ser: 1.05 mg/dL (ref 0.40–1.20)
GFR: 54.74 mL/min — ABNORMAL LOW (ref >60.00)
Glucose, Bld: 89 mg/dL (ref 70–99)
Potassium: 3.9 mEq/L (ref 3.5–5.1)
Sodium: 140 mEq/L (ref 135–145)
Total Bilirubin: 0.7 mg/dL (ref 0.2–1.2)
Total Protein: 7.5 g/dL (ref 6.0–8.3)

## 2017-12-18 LAB — VITAMIN B12: Vitamin B-12: 624 pg/mL (ref 211–911)

## 2017-12-18 LAB — HEMOGLOBIN A1C: Hgb A1c MFr Bld: 6.4 % (ref 4.6–6.5)

## 2017-12-18 LAB — VITAMIN D 25 HYDROXY (VIT D DEFICIENCY, FRACTURES): VITD: 39.83 ng/mL (ref 30.00–100.00)

## 2017-12-18 LAB — TSH: TSH: 1.24 u[IU]/mL (ref 0.35–4.50)

## 2017-12-18 MED ORDER — ATENOLOL 50 MG PO TABS: 50.0000 mg | ORAL_TABLET | Freq: Every day | ORAL | 3 refills | Status: DC

## 2017-12-18 MED ORDER — FENOFIBRATE 145 MG PO TABS: 145.0000 mg | ORAL_TABLET | Freq: Every day | ORAL | 3 refills | Status: DC

## 2017-12-18 MED ORDER — ERGOCALCIFEROL 1.25 MG (50000 UT) PO CAPS: 50000.0000 [IU] | ORAL_CAPSULE | ORAL | 0 refills | Status: DC

## 2017-12-18 NOTE — Assessment & Plan Note (Signed)
Taking vitamin d Check level 

## 2017-12-18 NOTE — Assessment & Plan Note (Signed)
?   Genetic Check B12, tsh Will discuss with derm

## 2017-12-18 NOTE — Assessment & Plan Note (Signed)
Did not tolerate a statin Continue fenofibrate Lipids, cmp

## 2017-12-18 NOTE — Assessment & Plan Note (Addendum)
Check lipid panel, tsh, cmp  Continue daily fenofibrate Regular exercise and healthy diet encouraged

## 2017-12-18 NOTE — Assessment & Plan Note (Addendum)
Slightly elevated today but she feels it has been normal whenever checked outside of here No change in meds Increase exercise Has lost weight cmp EKG today - Sinus Brady at 56 bpm, RSR, normal EKG, no prior on file for comparison

## 2017-12-18 NOTE — Assessment & Plan Note (Addendum)
dexa up to date Active, stressed regular exercise Taking calcium and vitamin

## 2017-12-18 NOTE — Assessment & Plan Note (Signed)
a1c

## 2017-12-18 NOTE — Assessment & Plan Note (Signed)
W/u here negative Saw gyn  - thought it was tendinitis Improving Gyn recommended pelvis PT

## 2017-12-20 ENCOUNTER — Encounter: Payer: Self-pay | Admitting: Internal Medicine

## 2017-12-20 DIAGNOSIS — R7303 Prediabetes: Secondary | ICD-10-CM | POA: Insufficient documentation

## 2017-12-20 DIAGNOSIS — E119 Type 2 diabetes mellitus without complications: Secondary | ICD-10-CM

## 2017-12-20 DIAGNOSIS — E1159 Type 2 diabetes mellitus with other circulatory complications: Secondary | ICD-10-CM | POA: Insufficient documentation

## 2017-12-20 HISTORY — DX: Prediabetes: R73.03

## 2017-12-20 HISTORY — DX: Type 2 diabetes mellitus without complications: E11.9

## 2018-01-08 LAB — HM MAMMOGRAPHY

## 2018-01-14 ENCOUNTER — Telehealth: Payer: Self-pay | Admitting: *Deleted

## 2018-01-14 DIAGNOSIS — E2839 Other primary ovarian failure: Secondary | ICD-10-CM

## 2018-01-14 NOTE — Telephone Encounter (Signed)
Called patient back and LVM in response to a VM she left nurse stating she would like to have bone density screening. Nurse placed the order for the scan and left patient the number to call radiology to set the appointment. Nurse requested that the patient contact her if she has any further questions or concerns.

## 2018-01-17 ENCOUNTER — Ambulatory Visit (INDEPENDENT_AMBULATORY_CARE_PROVIDER_SITE_OTHER)
Admission: RE | Admit: 2018-01-17 | Discharge: 2018-01-17 | Disposition: A | Discharge status: Home / Self Care | Payer: Medicare Other | Source: Ambulatory Visit | Attending: Internal Medicine | Admitting: Internal Medicine

## 2018-01-17 ENCOUNTER — Encounter: Payer: Self-pay | Admitting: Internal Medicine

## 2018-01-17 DIAGNOSIS — E2839 Other primary ovarian failure: Secondary | ICD-10-CM

## 2018-01-19 ENCOUNTER — Encounter: Payer: Self-pay | Admitting: Internal Medicine

## 2018-04-22 ENCOUNTER — Encounter: Payer: Self-pay | Admitting: Internal Medicine

## 2018-06-05 ENCOUNTER — Encounter: Payer: Self-pay | Admitting: Internal Medicine

## 2018-06-05 DIAGNOSIS — I10 Essential (primary) hypertension: Secondary | ICD-10-CM

## 2018-06-05 DIAGNOSIS — Z1211 Encounter for screening for malignant neoplasm of colon: Secondary | ICD-10-CM

## 2018-06-05 DIAGNOSIS — E782 Mixed hyperlipidemia: Secondary | ICD-10-CM

## 2018-06-05 DIAGNOSIS — R7303 Prediabetes: Secondary | ICD-10-CM

## 2018-06-12 NOTE — Patient Instructions (Addendum)
  Tests ordered today. Your results will be released to [place] (or called to you) after review, usually within 72hours after test completion. If any changes need to be made, you will be notified at that same time.   A carotid ultrasound was ordered. They will call you to schedule this.   Medications reviewed and updated.  Changes include :   none    Please followup in the fall for your physical

## 2018-06-12 NOTE — Progress Notes (Signed)
Subjective:    Patient ID: Morgan Hancock, female    DOB: [birth year], 73 y.o.   MRN: [id number]  HPI The patient is here for follow up.  She has done weight watchers and is exercising.  She has lost a lot of weight since she was here last.    Hypertension: She is taking her medication daily. She is compliant with a low sodium diet.  She denies chest pain, palpitations, edema, shortness of breath and regular headaches. She is exercising regularly.  She does monitor her blood pressure at home and it is much better than it is here.  She thinks she does have an element of whitecoat hypertension.    Prediabetes:  She is compliant with a low sugar/carbohydrate diet.  She is exercising regularly.  Hyperlipidemia: She is taking her medication daily. She is compliant with a low fat/cholesterol diet. She is exercising regularly. She denies myalgias.     Medications and allergies reviewed with patient and updated if appropriate.  Patient Active Problem List   Diagnosis Date Noted  . Prediabetes 12/20/2017  . Hair thinning 12/18/2017  . Atherosclerosis of aorta ([place]) 04/29/2017  . Right lower quadrant abdominal pain 04/18/2017  . Fibrocystic breast disease 11/20/2015  . Osteopenia, high frax 10/29/2015  . HLD (hyperlipidemia) 10/28/2015  . Fatty liver 10/28/2015  . Vitamin D deficiency 10/28/2015  . Cataract 10/28/2015  . DD (diverticular disease) 06/18/2013  . Colon polyp 06/18/2013  . Hypertension 05/27/2013    Current Outpatient Medications on File Prior to Visit  Medication Sig Dispense Refill  . aspirin 81 MG tablet Take 81 mg by mouth daily.    Marland Kitchen atenolol (TENORMIN) 50 MG tablet Take 1 tablet (50 mg total) by mouth daily. 90 tablet 3  . BIOTIN PO Take by mouth daily.    Marland Kitchen CALCIUM-VITAMIN D PO Take 1,200 mg by mouth.    . ergocalciferol (VITAMIN D2) 50000 units capsule Take 1 capsule (50,000 Units total) by mouth every 30 (thirty) days. 12 capsule 0  . fenofibrate (TRICOR) 145  MG tablet Take 1 tablet (145 mg total) by mouth daily. 90 tablet 3  . Omega-3 Fatty Acids (FISH OIL) 1000 MG CAPS Take 1,000 mg by mouth daily.     No current facility-administered medications on file prior to visit.     Past Medical History:  Diagnosis Date  . Hyperlipidemia   . Hypertension     Past Surgical History:  Procedure Laterality Date  . ABDOMINAL HYSTERECTOMY     prolapse  . GALLBLADDER SURGERY  [id number]  . TONSILLECTOMY  03/12/1952    Social History   Socioeconomic History  . Marital status: Married    Spouse name: Not on file  . Number of children: Not on file  . Years of education: Not on file  . Highest education level: Not on file  Occupational History  . Not on file  Social Needs  . Financial resource strain: Not hard at all  . Food insecurity:    Worry: Never true    Inability: Never true  . Transportation needs:    Medical: No    Non-medical: No  Tobacco Use  . Smoking status: Never Smoker  . Smokeless tobacco: Never Used  Substance and Sexual Activity  . Alcohol use: No  . Drug use: No  . Sexual activity: Not Currently  Lifestyle  . Physical activity:    Days per week: 5 days    Minutes per session: 30 min  .  Stress: Only a little  Relationships  . Social connections:    Talks on phone: More than three times a week    Gets together: More than three times a week    Attends religious service: More than 4 times per year    Active member of club or organization: Yes    Attends meetings of clubs or organizations: More than 4 times per year    Relationship status: Married  Other Topics Concern  . Not on file  Social History Narrative   Exercise: gardening, mows lawn, [facility] [employer] [facility]    Family History  Problem Relation Age of Onset  . Hyperlipidemia Mother   . Heart disease Father   . Diabetes Father   . Parkinson's disease Father   . Hyperlipidemia Brother   . Hypertension Brother   . Miscarriages / Stillbirths Maternal  Grandmother   . Miscarriages / Stillbirths Paternal Grandfather     Review of Systems  Constitutional: Negative for chills and fever.  Respiratory: Negative for cough, shortness of breath and wheezing.   Cardiovascular: Negative for chest pain, palpitations and leg swelling.  Neurological: Positive for dizziness (Transient with quick movements). Negative for light-headedness and headaches.       Objective:   Vitals:   06/13/18 1106  BP: (!) 146/80  Pulse: (!) 57  Resp: 16  Temp: 98.6 F (37 C)  SpO2: 99%   BP Readings from Last 3 Encounters:  06/13/18 (!) 146/80  12/18/17 (!) 146/88  09/19/17 126/62   Wt Readings from Last 3 Encounters:  06/13/18 120 lb 12.8 oz (54.8 kg)  12/18/17 138 lb (62.6 kg)  09/19/17 145 lb (65.8 kg)   Body mass index is 22.09 kg/m.   Physical Exam    Constitutional: Appears well-developed and well-nourished. No distress.  HENT:  Head: Normocephalic and atraumatic.  Neck: Neck supple. No tracheal deviation present. No thyromegaly present.  No cervical lymphadenopathy Cardiovascular: Normal rate, regular rhythm and normal heart sounds.   No murmur heard. No carotid bruit .  No edema Pulmonary/Chest: Effort normal and breath sounds normal. No respiratory distress. No has no wheezes. No rales.  Skin: Skin is warm and dry. Not diaphoretic.  Psychiatric: Normal mood and affect. Behavior is normal.      Assessment & Plan:    See Problem List for Assessment and Plan of chronic medical problems.

## 2018-06-13 ENCOUNTER — Encounter: Payer: Self-pay | Admitting: Internal Medicine

## 2018-06-13 ENCOUNTER — Ambulatory Visit: Payer: Medicare Other | Admitting: Internal Medicine

## 2018-06-13 ENCOUNTER — Other Ambulatory Visit (INDEPENDENT_AMBULATORY_CARE_PROVIDER_SITE_OTHER): Payer: Medicare Other

## 2018-06-13 VITALS — BP 146/80 | HR 57 | Temp 98.6°F | Resp 16 | Ht 62.0 in | Wt 120.8 lb

## 2018-06-13 DIAGNOSIS — K625 Hemorrhage of anus and rectum: Secondary | ICD-10-CM

## 2018-06-13 DIAGNOSIS — R7303 Prediabetes: Secondary | ICD-10-CM | POA: Diagnosis not present

## 2018-06-13 DIAGNOSIS — I10 Essential (primary) hypertension: Secondary | ICD-10-CM

## 2018-06-13 DIAGNOSIS — I6523 Occlusion and stenosis of bilateral carotid arteries: Secondary | ICD-10-CM

## 2018-06-13 DIAGNOSIS — I779 Disorder of arteries and arterioles, unspecified: Secondary | ICD-10-CM

## 2018-06-13 DIAGNOSIS — E782 Mixed hyperlipidemia: Secondary | ICD-10-CM

## 2018-06-13 DIAGNOSIS — I739 Peripheral vascular disease, unspecified: Secondary | ICD-10-CM

## 2018-06-13 HISTORY — DX: Disorder of arteries and arterioles, unspecified: I77.9

## 2018-06-13 LAB — COMPREHENSIVE METABOLIC PANEL
ALT: 26 U/L (ref 0–35)
AST: 35 U/L (ref 0–37)
Albumin: 4.3 g/dL (ref 3.5–5.2)
Alkaline Phosphatase: 50 U/L (ref 39–117)
BUN: 21 mg/dL (ref 6–23)
CO2: 28 mEq/L (ref 19–32)
Calcium: 9.5 mg/dL (ref 8.4–10.5)
Chloride: 105 mEq/L (ref 96–112)
Creatinine, Ser: 0.95 mg/dL (ref 0.40–1.20)
GFR: 57.73 mL/min — ABNORMAL LOW (ref >60.00)
Glucose, Bld: 82 mg/dL (ref 70–99)
Potassium: 4.1 mEq/L (ref 3.5–5.1)
Sodium: 141 mEq/L (ref 135–145)
Total Bilirubin: 0.6 mg/dL (ref 0.2–1.2)
Total Protein: 6.9 g/dL (ref 6.0–8.3)

## 2018-06-13 LAB — CBC WITH DIFFERENTIAL/PLATELET
Basophils Absolute: 0.1 10*3/uL (ref 0.0–0.1)
Basophils Relative: 0.9 % (ref 0.0–3.0)
Eosinophils Absolute: 0.4 10*3/uL (ref 0.0–0.7)
Eosinophils Relative: 6.5 % — ABNORMAL HIGH (ref 0.0–5.0)
HCT: 40 % (ref 36.0–46.0)
Hemoglobin: 13.2 g/dL (ref 12.0–15.0)
Lymphocytes Relative: 27.1 % (ref 12.0–46.0)
Lymphs Abs: 1.6 10*3/uL (ref 0.7–4.0)
MCHC: 33.1 g/dL (ref 30.0–36.0)
MCV: 91 fl (ref 78.0–100.0)
Monocytes Absolute: 0.4 10*3/uL (ref 0.1–1.0)
Monocytes Relative: 6.3 % (ref 3.0–12.0)
Neutro Abs: 3.4 10*3/uL (ref 1.4–7.7)
Neutrophils Relative %: 59.2 % (ref 43.0–77.0)
Platelets: 225 10*3/uL (ref 150.0–400.0)
RBC: 4.39 Mil/uL (ref 3.87–5.11)
RDW: 14.4 % (ref 11.5–15.5)
WBC: 5.8 10*3/uL (ref 4.0–10.5)

## 2018-06-13 LAB — LIPID PANEL
Cholesterol: 176 mg/dL (ref 0–200)
HDL: 48.8 mg/dL (ref >39.00)
LDL Cholesterol: 114 mg/dL — ABNORMAL HIGH (ref 0–99)
NonHDL: 126.96
Total CHOL/HDL Ratio: 4
Triglycerides: 66 mg/dL (ref 0.0–149.0)
VLDL: 13.2 mg/dL (ref 0.0–40.0)

## 2018-06-13 LAB — HEMOGLOBIN A1C: Hgb A1c MFr Bld: 5.9 % (ref 4.6–6.5)

## 2018-06-13 NOTE — Assessment & Plan Note (Signed)
Has a history of carotid artery disease by ultrasound from 2014 Lipids well controlled, but not on a statin Did not tolerate previous statin Will check carotid ultrasound-discussed that we can consider retrying a statin in a very small dose in the near future Lipid panel, CMP

## 2018-06-13 NOTE — Assessment & Plan Note (Signed)
Has had some mild rectal bleeding-bright red blood that is likely hemorrhoidal Has an appointment with GI next month Bleeding has been minimal, but will check CBC She will try Preparation H over-the-counter Advised to call if this is not effective and I can send in a steroid cream

## 2018-06-13 NOTE — Assessment & Plan Note (Signed)
Lipids controlled Continue TriCor We will discuss possible low-dose statin depending on results of carotid ultrasound

## 2018-06-13 NOTE — Assessment & Plan Note (Signed)
BP slightly elevated here, but better controlled at home Continue current medication at current dose CMP

## 2018-06-13 NOTE — Assessment & Plan Note (Signed)
She has lost significant weight over the past 6 months with diet and exercise.  Currently doing weight watchers Will check A1c

## 2018-06-27 ENCOUNTER — Ambulatory Visit (HOSPITAL_COMMUNITY)
Admission: RE | Admit: 2018-06-27 | Discharge: 2018-06-27 | Disposition: A | Discharge status: Home / Self Care | Payer: Medicare Other | Source: Ambulatory Visit | Attending: Cardiology | Admitting: Cardiology

## 2018-06-27 DIAGNOSIS — I6523 Occlusion and stenosis of bilateral carotid arteries: Secondary | ICD-10-CM | POA: Insufficient documentation

## 2018-07-08 ENCOUNTER — Encounter: Payer: Self-pay | Admitting: Internal Medicine

## 2018-07-08 ENCOUNTER — Ambulatory Visit: Payer: Medicare Other | Admitting: Internal Medicine

## 2018-07-08 VITALS — BP 118/72 | HR 60 | Ht 62.0 in | Wt 120.0 lb

## 2018-07-08 DIAGNOSIS — Z86010 Personal history of colonic polyps: Secondary | ICD-10-CM

## 2018-07-08 DIAGNOSIS — K625 Hemorrhage of anus and rectum: Secondary | ICD-10-CM

## 2018-07-08 NOTE — Progress Notes (Signed)
Morgan Hancock 73 y.o. 1946/02/25 [id number]  Assessment & Plan:   Encounter Diagnoses  Name Primary?  . Rectal bleeding Yes  . Hx of adenomatous colonic polyps     Probably is having hemorrhoidal bleeding.  Given the signs and symptoms and the history of adenomatous colon polyps versus polyp I think proceeding with colonoscopy is prudent.  Endoscopy risks please further plans including possible hemorrhoidal banding pending these results.  I appreciate the opportunity to care for this patient. CC: Binnie Rail, MD    Subjective:   Chief Complaint: Rectal bleeding history of colon polyps  HPI The patient is here for evaluation of rectal bleeding and also for follow-up of colon polyps, she had 3 adenomas max 7 mm removed in [facility] on 06/18/2013.  I think she had 3 adenomas removed.  I know she had 3 polyps and she had a letter that told her she had an adenomatous colon polyp.  She was recommended for repeat colonoscopy in 5 years.  She has been having intermittent rectal bleeding with blood dripping and anal irritation.  She thinks she has hemorrhoids.  They were seen at her colonoscopy "grade 2".  She is not using any particular over-the-counter agents she is not straining to stool or spending long periods of time on the toilet.  All other review of systems appear negative at this time.    Lab Results  Component Value Date   WBC 5.8 06/13/2018   HGB 13.2 06/13/2018   HCT 40.0 06/13/2018   MCV 91.0 06/13/2018   PLT 225.0 06/13/2018    Allergies  Allergen Reactions  . Lipitor [Atorvastatin]     Joint pain   Current Meds  Medication Sig  . aspirin 81 MG tablet Take 81 mg by mouth daily.  Marland Kitchen atenolol (TENORMIN) 50 MG tablet Take 1 tablet (50 mg total) by mouth daily.  Marland Kitchen BIOTIN PO Take by mouth daily.  Marland Kitchen CALCIUM-VITAMIN D PO Take 1,200 mg by mouth.  . ergocalciferol (VITAMIN D2) 50000 units capsule Take 1 capsule (50,000 Units total) by mouth every 30  (thirty) days.  . fenofibrate (TRICOR) 145 MG tablet Take 1 tablet (145 mg total) by mouth daily.  . Omega-3 Fatty Acids (FISH OIL) 1000 MG CAPS Take 1,000 mg by mouth daily.   Past Medical History:  Diagnosis Date  . Colon polyps 06/18/2013  . Hyperlipidemia   . Hypertension    Past Surgical History:  Procedure Laterality Date  . ABDOMINAL HYSTERECTOMY     prolapse  . COLONOSCOPY  06/18/2013  . GALLBLADDER SURGERY  [id number]  . 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   Review of Systems As per HPI.  She has no other specific complaints at this time all other review of systems are negative.  Objective:   Physical Exam @BP  118/72   Pulse 60   Ht 5\' 2"  (1.575 m)   Wt 120 lb (54.4 kg)   BMI 21.95 kg/m @  General:  Well-developed, well-nourished and in no acute distress Eyes:  anicteric. ENT:   Mouth and posterior pharynx free  of lesions.  Some dentures.   Neck:  supple w/o thyromegaly or mass.  Lungs: Clear to auscultation bilaterally. Heart:  S1S2, no rubs, murmurs, gallops. Abdomen:  soft, non-tender, no hepatosplenomegaly, hernia, or mass and BS+.  Rectal:  Inspection only reveals some small fleshy anal tags noted digital exam deferred until colonoscopy Lymph:  no cervical or supraclavicular adenopathy. Extremities:   no edema, cyanosis or clubbing Skin   no rash. Neuro:  A&O x 3.  Psych:  appropriate mood and  Affect.   Data Reviewed: See HPI

## 2018-07-08 NOTE — Patient Instructions (Signed)
Normal BMI (Body Mass Index- based on height and weight) is between 23 and 30. Your BMI today is Body mass index is 21.95 kg/m. Marland Kitchen Please consider follow up  regarding your BMI with your Primary Care Provider.   You have been scheduled for a colonoscopy. Please follow written instructions given to you at your visit today.  Please pick up your prep supplies at the pharmacy within the next 1-3 days. If you use inhalers (even only as needed), please bring them with you on the day of your procedure.   I appreciate the opportunity to care for you. Silvano Rusk, MD, [facility]

## 2018-07-18 ENCOUNTER — Encounter: Payer: Self-pay | Admitting: Internal Medicine

## 2018-07-18 ENCOUNTER — Other Ambulatory Visit: Payer: Self-pay

## 2018-07-18 ENCOUNTER — Ambulatory Visit (AMBULATORY_SURGERY_CENTER): Payer: Medicare Other | Admitting: Internal Medicine

## 2018-07-18 VITALS — BP 128/57 | HR 57 | Temp 97.8°F | Resp 16 | Ht 62.0 in | Wt 120.0 lb

## 2018-07-18 DIAGNOSIS — D122 Benign neoplasm of ascending colon: Secondary | ICD-10-CM

## 2018-07-18 DIAGNOSIS — Z86010 Personal history of colonic polyps: Secondary | ICD-10-CM

## 2018-07-18 DIAGNOSIS — K625 Hemorrhage of anus and rectum: Secondary | ICD-10-CM

## 2018-07-18 DIAGNOSIS — D124 Benign neoplasm of descending colon: Secondary | ICD-10-CM

## 2018-07-18 DIAGNOSIS — Z860101 Personal history of adenomatous and serrated colon polyps: Secondary | ICD-10-CM

## 2018-07-18 HISTORY — PX: COLONOSCOPY WITH PROPOFOL: SHX5780

## 2018-07-18 MED ORDER — SODIUM CHLORIDE 0.9 % IV SOLN: 500.0000 mL | Freq: Once | INTRAVENOUS | Status: DC

## 2018-07-18 NOTE — Patient Instructions (Addendum)
I found and removed 3 small polyps. You also have a condition called diverticulosis - common and not usually a problem. Please read the handout provided. Internal hemorrhoid also seen.  We can arrange hemorrhoid banding if desired - you may call and set up an appointment when ready, if so.  I will let you know pathology results and when to have another routine colonoscopy by mail and/or My Chart.  I appreciate the opportunity to care for you. Gatha Mayer, MD, FACG   YOU HAD AN ENDOSCOPIC PROCEDURE TODAY AT [facility]:   Refer to the procedure report that was given to you for any specific questions about what was found during the examination.  If the procedure report does not answer your questions, please call your gastroenterologist to clarify.  If you requested that your care partner not be given the details of your procedure findings, then the procedure report has been included in a sealed envelope for you to review at your convenience later.  YOU SHOULD EXPECT: Some feelings of bloating in the abdomen. Passage of more gas than usual.  Walking can help get rid of the air that was put into your GI tract during the procedure and reduce the bloating. If you had a lower endoscopy (such as a colonoscopy or flexible sigmoidoscopy) you may notice spotting of blood in your stool or on the toilet paper. If you underwent a bowel prep for your procedure, you may not have a normal bowel movement for a few days.  Please Note:  You might notice some irritation and congestion in your nose or some drainage.  This is from the oxygen used during your procedure.  There is no need for concern and it should clear up in a day or so.  SYMPTOMS TO REPORT IMMEDIATELY:   Following lower endoscopy (colonoscopy or flexible sigmoidoscopy):  Excessive amounts of blood in the stool  Significant tenderness or worsening of abdominal pains  Swelling of the abdomen that is new, acute  Fever of  100F or higher   For urgent or emergent issues, a gastroenterologist can be reached at any hour by calling [phone].   DIET:  We do recommend a small meal at first, but then you may proceed to your regular diet.  Drink plenty of fluids but you should avoid alcoholic beverages for 24 hours.  ACTIVITY:  You should plan to take it easy for the rest of today and you should NOT DRIVE or use heavy machinery until tomorrow (because of the sedation medicines used during the test).    FOLLOW UP: Our staff will call the number listed on your records the next business day following your procedure to check on you and address any questions or concerns that you may have regarding the information given to you following your procedure. If we do not reach you, we will leave a message.  However, if you are feeling well and you are not experiencing any problems, there is no need to return our call.  We will assume that you have returned to your regular daily activities without incident.  If any biopsies were taken you will be contacted by phone or by letter within the next 1-3 weeks.  Please call us at [phone] if you have not heard about the biopsies in 3 weeks.    SIGNATURES/CONFIDENTIALITY: You and/or your care partner have signed paperwork which will be entered into your electronic medical record.  These signatures attest to the fact  that that the information above on your After Visit Summary has been reviewed and is understood.  Full responsibility of the confidentiality of this discharge information lies with you and/or your care-partner.

## 2018-07-18 NOTE — Op Note (Signed)
[facility] Patient Name: Morgan Hancock Procedure Date: 07/18/2018 1:35 PM MRN: [id number] Endoscopist: Gatha Mayer , MD Age: 73 Referring MD:  Date of Birth: October 14, 1945 Gender: Female Account #: [id number] Procedure:                Colonoscopy Indications:              High risk colon cancer surveillance: Personal                            history of colonic polyps Medicines:                Propofol per Anesthesia, Monitored Anesthesia Care Procedure:                Pre-Anesthesia Assessment:                           - Prior to the procedure, a History and Physical                            was performed, and patient medications and                            allergies were reviewed. The patient's tolerance of                            previous anesthesia was also reviewed. The risks                            and benefits of the procedure and the sedation                            options and risks were discussed with the patient.                            All questions were answered, and informed consent                            was obtained. Prior Anticoagulants: The patient has                            taken no previous anticoagulant or antiplatelet                            agents. ASA Grade Assessment: II - A patient with                            mild systemic disease. After reviewing the risks                            and benefits, the patient was deemed in                            satisfactory condition to undergo the procedure.  After obtaining informed consent, the colonoscope                            was passed under direct vision. Throughout the                            procedure, the patient's blood pressure, pulse, and                            oxygen saturations were monitored continuously. The                            Colonoscope was introduced through the anus and                            advanced to the  the cecum, identified by                            appendiceal orifice and ileocecal valve. The                            colonoscopy was performed without difficulty. The                            patient tolerated the procedure well. The quality                            of the bowel preparation was good. The bowel                            preparation used was Miralax. The ileocecal valve,                            appendiceal orifice, and rectum were photographed. Scope In: 1:42:31 PM Scope Out: 2:07:19 PM Scope Withdrawal Time: 0 hours 20 minutes 28 seconds  Total Procedure Duration: 0 hours 24 minutes 48 seconds  Findings:                 The perianal and digital rectal examinations were                            normal.                           Three sessile polyps were found in the sigmoid                            colon, descending colon and ascending colon. The                            polyps were 2 to 5 mm in size. These polyps were                            removed with a cold snare. Resection and retrieval  were complete. Verification of patient                            identification for the specimen was done. Estimated                            blood loss was minimal.                           Multiple diverticula were found in the sigmoid                            colon.                           Internal hemorrhoids were found during retroflexion.                           The exam was otherwise without abnormality on                            direct and retroflexion views. Complications:            No immediate complications. Estimated Blood Loss:     Estimated blood loss was minimal. Impression:               - Three 2 to 5 mm polyps in the sigmoid colon, in                            the descending colon and in the ascending colon,                            removed with a cold snare. Resected and retrieved.                            - Diverticulosis in the sigmoid colon.                           - Internal hemorrhoids.                           - The examination was otherwise normal on direct                            and retroflexion views.                           - Personal history of colonic polyps. Recommendation:           - Patient has a contact number available for                            emergencies. The signs and symptoms of potential                            delayed complications were discussed with the  patient. Return to normal activities tomorrow.                            Written discharge instructions were provided to the                            patient.                           - Resume previous diet.                           - Continue present medications.                           - Repeat colonoscopy is recommended. The                            colonoscopy date will be determined after pathology                            results from today's exam become available for                            review. Gatha Mayer, MD 07/18/2018 2:19:58 PM This report has been signed electronically.

## 2018-07-18 NOTE — Progress Notes (Signed)
A and O x3. Report to RN. Tolerated MAC anesthesia well.

## 2018-07-19 ENCOUNTER — Telehealth: Payer: Self-pay

## 2018-07-19 NOTE — Telephone Encounter (Signed)
  Follow up Call-  Call back number 07/18/2018  Post procedure Call Back phone  # [phone]  Permission to leave phone message Yes  Some recent data might be hidden     Patient questions:  Do you have a fever, pain , or abdominal swelling? No. Pain Score  0 *  Have you tolerated food without any problems? Yes.    Have you been able to return to your normal activities? Yes.    Do you have any questions about your discharge instructions: Diet   No. Medications  No. Follow up visit  No.  Do you have questions or concerns about your Care? No.  Actions: * If pain score is 4 or above: No action needed, pain <4.

## 2018-07-26 ENCOUNTER — Encounter: Payer: Self-pay | Admitting: Internal Medicine

## 2018-07-26 NOTE — Progress Notes (Signed)
3 adenomas Repeat colonoscopy to be considered in 3 years has hax 3 adenomas in past also

## 2018-08-05 ENCOUNTER — Encounter: Payer: Medicare Other | Admitting: Internal Medicine

## 2018-08-29 ENCOUNTER — Encounter: Payer: Self-pay | Admitting: Internal Medicine

## 2018-08-30 ENCOUNTER — Other Ambulatory Visit (INDEPENDENT_AMBULATORY_CARE_PROVIDER_SITE_OTHER): Payer: Medicare Other

## 2018-08-30 ENCOUNTER — Encounter: Payer: Self-pay | Admitting: Internal Medicine

## 2018-08-30 ENCOUNTER — Ambulatory Visit (INDEPENDENT_AMBULATORY_CARE_PROVIDER_SITE_OTHER): Payer: Medicare Other | Admitting: Internal Medicine

## 2018-08-30 DIAGNOSIS — R300 Dysuria: Secondary | ICD-10-CM

## 2018-08-30 DIAGNOSIS — M549 Dorsalgia, unspecified: Secondary | ICD-10-CM

## 2018-08-30 LAB — URINALYSIS, ROUTINE W REFLEX MICROSCOPIC
Bilirubin Urine: NEGATIVE
Ketones, ur: NEGATIVE
Nitrite: NEGATIVE
Specific Gravity, Urine: 1.015 (ref 1.000–1.030)
Total Protein, Urine: 30 — AB
Urine Glucose: NEGATIVE
Urobilinogen, UA: 0.2 (ref 0.0–1.0)
pH: 7.5 (ref 5.0–8.0)

## 2018-08-30 MED ORDER — CEPHALEXIN 500 MG PO CAPS: 500.0000 mg | ORAL_CAPSULE | Freq: Two times a day (BID) | ORAL | 0 refills | Status: DC

## 2018-08-30 NOTE — Assessment & Plan Note (Signed)
Has had pain in her right kidney region for the past 2 to 3 days.  Last night pain was severe and she did vomit twice Concern for UTI, pyelonephritis, kidney stone Will empirically treat for a UTI with Keflex She will give a urinalysis and culture It is Friday afternoon and will not be able to get any imaging done-her pain is better today so we will see how she does over the weekend and if she is still having pain she may need imaging to evaluate for kidney stone  Advised her to call with any questions or concerns

## 2018-08-30 NOTE — Progress Notes (Signed)
Virtual Visit via Video Note  I connected with Morgan Hancock on 08/30/18 at  2:30 PM EDT by a video enabled telemedicine application and verified that I am speaking with the correct person using two identifiers.   I discussed the limitations of evaluation and management by telemedicine and the availability of in person appointments. The patient expressed understanding and agreed to proceed.  The patient is currently at home and I am in the office.    No referring provider.    History of Present Illness: This is an acute visit for pain in her kidney area.  She states 2 to 3 days ago she started experiencing an intermittent pain in her right kidney region.  She describes as a twist or spasm.  Last night she had a constant, dull pain there around 6:00 at night that was severe.  She quantifies the pain is 7/10.  She states the pain was not related to changes in position.  She did vomit twice last night.  She denies any skin changes or rashes in the area.  She states the pain is better today and only at a level of 2-3/10.  Last night her temperature was 97.8 and today was 99.6.  She denies any coughing or respiratory symptoms.  She has had a little bit of burning sensation at the urethra tip for a few days.  She does not have burning with urination.  She has not noted any blood in the urine or increased urinary frequency.  She denies abdominal pain.  She denies a personal history of kidney stones, but does have a family history.   Review of Systems  Constitutional: Negative for fever.  Respiratory: Negative for cough.   Gastrointestinal: Positive for vomiting (x 2 last night). Negative for abdominal pain.  Genitourinary: Negative for dysuria (Burning sensation at urethra tip), flank pain, frequency and hematuria.       No abnormal urine color  Musculoskeletal: Positive for back pain.  Skin: Negative for rash.     Social History   Socioeconomic History  . Marital status: Married   Spouse name: Not on file  . Number of children: Not on file  . Years of education: Not on file  . Highest education level: Not on file  Occupational History  . Not on file  Social Needs  . Financial resource strain: Not hard at all  . Food insecurity:    Worry: Never true    Inability: Never true  . Transportation needs:    Medical: No    Non-medical: No  Tobacco Use  . Smoking status: Never Smoker  . Smokeless tobacco: Never Used  Substance and Sexual Activity  . Alcohol use: No  . Drug use: No  . Sexual activity: Not Currently  Lifestyle  . Physical activity:    Days per week: 5 days    Minutes per session: 30 min  . Stress: Only a little  Relationships  . Social connections:    Talks on phone: More than three times a week    Gets together: More than three times a week    Attends religious service: More than 4 times per year    Active member of club or organization: Yes    Attends meetings of clubs or organizations: More than 4 times per year    Relationship status: Married  Other Topics Concern  . Not on file  Social History Narrative   Married   Exercise: gardening, mows lawn, goes to [employer] in  fall- winter   No alcohol tobacco or drug use     Observations/Objective: Appears well in NAD   Assessment and Plan:  See Problem List for Assessment and Plan of chronic medical problems.   Follow Up Instructions:    I discussed the assessment and treatment plan with the patient. The patient was provided an opportunity to ask questions and all were answered. The patient agreed with the plan and demonstrated an understanding of the instructions.   The patient was advised to call back or seek an in-person evaluation if the symptoms worsen or if the condition fails to improve as anticipated.    Binnie Rail, MD

## 2018-08-30 NOTE — Assessment & Plan Note (Signed)
She has what sounds like urethritis or mild dysuria-she has a burning sensation at the tip of her urethra Given her other symptoms concern for possible UTI She will come and get a urinalysis/culture Will empirically start Keflex twice daily x7 days since it is the weekend and I am concerned about her possibly having developing pyelonephritis.  I do not think she has that at this point She was advised to call with any questions or concerns

## 2018-09-01 ENCOUNTER — Encounter: Payer: Self-pay | Admitting: Internal Medicine

## 2018-09-01 LAB — URINE CULTURE
MICRO NUMBER:: 420139
SPECIMEN QUALITY:: ADEQUATE

## 2019-01-08 NOTE — Patient Instructions (Addendum)
Tests ordered today. Your results will be released to MyChart (or called to you) after review.  If any changes need to be made, you will be notified at that same time.  All other Health Maintenance issues reviewed.   All recommended immunizations and age-appropriate screenings are up-to-date or discussed.  No immunization administered today.   Medications reviewed and updated.  Changes include :   none   Please followup in 1 year   Health Maintenance, Female Adopting a healthy lifestyle and getting preventive care are important in promoting health and wellness. Ask your health care provider about:  The right schedule for you to have regular tests and exams.  Things you can do on your own to prevent diseases and keep yourself healthy. What should I know about diet, weight, and exercise? Eat a healthy diet   Eat a diet that includes plenty of vegetables, fruits, low-fat dairy products, and lean protein.  Do not eat a lot of foods that are high in solid fats, added sugars, or sodium. Maintain a healthy weight Body mass index (BMI) is used to identify weight problems. It estimates body fat based on height and weight. Your health care provider can help determine your BMI and help you achieve or maintain a healthy weight. Get regular exercise Get regular exercise. This is one of the most important things you can do for your health. Most adults should:  Exercise for at least 150 minutes each week. The exercise should increase your heart rate and make you sweat (moderate-intensity exercise).  Do strengthening exercises at least twice a week. This is in addition to the moderate-intensity exercise.  Spend less time sitting. Even light physical activity can be beneficial. Watch cholesterol and blood lipids Have your blood tested for lipids and cholesterol at 73 years of age, then have this test every 5 years. Have your cholesterol levels checked more often if:  Your lipid or cholesterol  levels are high.  You are older than 73 years of age.  You are at high risk for heart disease. What should I know about cancer screening? Depending on your health history and family history, you may need to have cancer screening at various ages. This may include screening for:  Breast cancer.  Cervical cancer.  Colorectal cancer.  Skin cancer.  Lung cancer. What should I know about heart disease, diabetes, and high blood pressure? Blood pressure and heart disease  High blood pressure causes heart disease and increases the risk of stroke. This is more likely to develop in people who have high blood pressure readings, are of African descent, or are overweight.  Have your blood pressure checked: ? Every 3-5 years if you are 18-39 years of age. ? Every year if you are 40 years old or older. Diabetes Have regular diabetes screenings. This checks your fasting blood sugar level. Have the screening done:  Once every three years after age 40 if you are at a normal weight and have a low risk for diabetes.  More often and at a younger age if you are overweight or have a high risk for diabetes. What should I know about preventing infection? Hepatitis B If you have a higher risk for hepatitis B, you should be screened for this virus. Talk with your health care provider to find out if you are at risk for hepatitis B infection. Hepatitis C Testing is recommended for:  Everyone born from 1945 through 1965.  Anyone with known risk factors for hepatitis C. Sexually transmitted   Sexually transmitted infections (STIs)  Get screened for STIs, including gonorrhea and chlamydia, if: ? You are sexually active and are younger than 73 years of age. ? You are older than 73 years of age and your health care provider tells you that you are at risk for this type of infection. ? Your sexual activity has changed since you were last screened, and you are at increased risk for chlamydia or gonorrhea. Ask your  health care provider if you are at risk.  Ask your health care provider about whether you are at high risk for HIV. Your health care provider may recommend a prescription medicine to help prevent HIV infection. If you choose to take medicine to prevent HIV, you should first get tested for HIV. You should then be tested every 3 months for as long as you are taking the medicine. Pregnancy  If you are about to stop having your period (premenopausal) and you may become pregnant, seek counseling before you get pregnant.  Take 400 to 800 micrograms (mcg) of folic acid every day if you become pregnant.  Ask for birth control (contraception) if you want to prevent pregnancy. Osteoporosis and menopause Osteoporosis is a disease in which the bones lose minerals and strength with aging. This can result in bone fractures. If you are 65 years old or older, or if you are at risk for osteoporosis and fractures, ask your health care provider if you should:  Be screened for bone loss.  Take a calcium or vitamin D supplement to lower your risk of fractures.  Be given hormone replacement therapy (HRT) to treat symptoms of menopause. Follow these instructions at home: Lifestyle  Do not use any products that contain nicotine or tobacco, such as cigarettes, e-cigarettes, and chewing tobacco. If you need help quitting, ask your health care provider.  Do not use street drugs.  Do not share needles.  Ask your health care provider for help if you need support or information about quitting drugs. Alcohol use  Do not drink alcohol if: ? Your health care provider tells you not to drink. ? You are pregnant, may be pregnant, or are planning to become pregnant.  If you drink alcohol: ? Limit how much you use to 0-1 drink a day. ? Limit intake if you are breastfeeding.  Be aware of how much alcohol is in your drink. In the [place] one drink equals one 12 oz bottle of beer (355 mL), one 5 oz glass of wine (148  mL), or one 1 oz glass of hard liquor (44 mL). General instructions  Schedule regular health, dental, and eye exams.  Stay current with your vaccines.  Tell your health care provider if: ? You often feel depressed. ? You have ever been abused or do not feel safe at home. Summary  Adopting a healthy lifestyle and getting preventive care are important in promoting health and wellness.  Follow your health care provider's instructions about healthy diet, exercising, and getting tested or screened for diseases.  Follow your health care provider's instructions on monitoring your cholesterol and blood pressure. This information is not intended to replace advice given to you by your health care provider. Make sure you discuss any questions you have with your health care provider. Document Released: 11/07/2010 Document Revised: 04/17/2018 Document Reviewed: 04/17/2018 Elsevier Patient Education  2020 Elsevier Inc.  

## 2019-01-08 NOTE — Progress Notes (Signed)
Subjective:    Patient ID: Morgan Hancock, female    DOB: 1946/02/27, 73 y.o.   MRN: [id number]  HPI She is here for a physical exam.   She has been experiencing some right shoulder pain.  She did a lot of cleaning for numerous hours and was experiencing shoulder pain similar to what she experienced in the past.  She did see orthopedics at [facility], had an x-ray and an injection.  It is feeling better, but not 100%.  She otherwise feels well and has no concerns.  Medications and allergies reviewed with patient and updated if appropriate.  Patient Active Problem List   Diagnosis Date Noted  . Dysuria 08/30/2018  . Costovertebral angle pain 08/30/2018  . Carotid arterial disease ([place]) 06/13/2018  . Rectal bleeding 06/13/2018  . Prediabetes 12/20/2017  . Hair thinning 12/18/2017  . Atherosclerosis of aorta ([place]) 04/29/2017  . Right lower quadrant abdominal pain 04/18/2017  . Fibrocystic breast disease 11/20/2015  . Osteopenia, high frax 10/29/2015  . HLD (hyperlipidemia) 10/28/2015  . Fatty liver 10/28/2015  . Vitamin D deficiency 10/28/2015  . Cataract 10/28/2015  . DD (diverticular disease) 06/18/2013  . Hx of adenomatous colonic polyps 06/18/2013  . Hypertension 05/27/2013    Current Outpatient Medications on File Prior to Visit  Medication Sig Dispense Refill  . aspirin 81 MG tablet Take 81 mg by mouth daily.    Marland Kitchen atenolol (TENORMIN) 50 MG tablet Take 1 tablet (50 mg total) by mouth daily. 90 tablet 3  . BIOTIN PO Take by mouth daily.    Marland Kitchen CALCIUM-VITAMIN D PO Take 1,200 mg by mouth.    . ergocalciferol (VITAMIN D2) 50000 units capsule Take 1 capsule (50,000 Units total) by mouth every 30 (thirty) days. 12 capsule 0  . fenofibrate (TRICOR) 145 MG tablet Take 1 tablet (145 mg total) by mouth daily. 90 tablet 3  . meloxicam (MOBIC) 15 MG tablet Take 15 mg by mouth daily.    . methocarbamol (ROBAXIN) 500 MG tablet TAKE 1 TABLET BY MOUTH EVERY 8 HOURS *NOT COVERED BY  INSURANCE*    . Omega-3 Fatty Acids (FISH OIL) 1000 MG CAPS Take 1,000 mg by mouth daily.     No current facility-administered medications on file prior to visit.     Past Medical History:  Diagnosis Date  . Cataract   . Colon polyps 06/18/2013  . Hx of adenomatous colonic polyps 06/18/2013   Overview:  Had colonoscopy 06/18/2013 added new dx of colon polyps and Diverticuiosis   . Hyperlipidemia   . Hypertension   . Osteopenia after menopause 2019    Past Surgical History:  Procedure Laterality Date  . ABDOMINAL HYSTERECTOMY     prolapse  . COLONOSCOPY  06/18/2013  . GALLBLADDER SURGERY  [id number]  . TONSILLECTOMY  03/12/1952    Social History   Socioeconomic History  . Marital status: Married    Spouse name: Not on file  . Number of children: Not on file  . Years of education: Not on file  . Highest education level: Not on file  Occupational History  . Not on file  Social Needs  . Financial resource strain: Not hard at all  . Food insecurity    Worry: Never true    Inability: Never true  . Transportation needs    Medical: No    Non-medical: No  Tobacco Use  . Smoking status: Never Smoker  . Smokeless tobacco: Never Used  Substance and Sexual Activity  .  Alcohol use: No  . Drug use: No  . Sexual activity: Not Currently  Lifestyle  . Physical activity    Days per week: 5 days    Minutes per session: 30 min  . Stress: Only a little  Relationships  . Social connections    Talks on phone: More than three times a week    Gets together: More than three times a week    Attends religious service: More than 4 times per year    Active member of club or organization: Yes    Attends meetings of clubs or organizations: More than 4 times per year    Relationship status: Married  Other Topics Concern  . Not on file  Social History Narrative   Married   Exercise: gardening, mows lawn, [place] [employer] [facility] [place] [place]   No alcohol tobacco or drug use    Family History   Problem Relation Age of Onset  . Hyperlipidemia Mother   . Heart disease Father   . Diabetes Father   . Parkinson's disease Father   . Hyperlipidemia Brother   . Hypertension Brother   . Miscarriages / Stillbirths Maternal Grandmother   . Miscarriages / Stillbirths Paternal Grandfather   . Colon cancer Neg Hx   . Stomach cancer Neg Hx   . Colon polyps Neg Hx   . Esophageal cancer Neg Hx   . Rectal cancer Neg Hx     Review of Systems  Constitutional: Negative for chills, fatigue and fever.  Eyes: Negative for visual disturbance.  Respiratory: Negative for cough, shortness of breath and wheezing.   Cardiovascular: Negative for chest pain, palpitations and leg swelling.  Gastrointestinal: Negative for abdominal pain, blood in stool, constipation, diarrhea and nausea.       No gerd  Genitourinary: Negative for dysuria and hematuria.  Musculoskeletal: Positive for arthralgias (right shoulder).  Skin: Negative for color change and rash.  Neurological: Positive for dizziness (rare). Negative for light-headedness and headaches.  Psychiatric/Behavioral: Negative for dysphoric mood. The patient is not nervous/anxious.        Objective:   Vitals:   01/09/19 0813  BP: 118/78  Pulse: 60  Temp: 98.1 F (36.7 C)  SpO2: 95%   Filed Weights   01/09/19 0813  Weight: 120 lb (54.4 kg)   Body mass index is 21.95 kg/m.  BP Readings from Last 3 Encounters:  01/09/19 118/78  07/18/18 (!) 128/57  07/08/18 118/72    Wt Readings from Last 3 Encounters:  01/09/19 120 lb (54.4 kg)  07/18/18 120 lb (54.4 kg)  07/08/18 120 lb (54.4 kg)     Physical Exam Constitutional: She appears well-developed and well-nourished. No distress.  HENT:  Head: Normocephalic and atraumatic.  Right Ear: External ear normal. Normal ear canal and TM Left Ear: External ear normal.  Normal ear canal and TM Mouth/Throat: Oropharynx is clear and moist.  Eyes: Conjunctivae and EOM are normal.  Neck: Neck  supple. No tracheal deviation present. No thyromegaly present.  No carotid bruit  Cardiovascular: Normal rate, regular rhythm and normal heart sounds.   No murmur heard.  No edema. Pulmonary/Chest: Effort normal and breath sounds normal. No respiratory distress. She has no wheezes. She has no rales.  Breast: deferred   Abdominal: Soft. She exhibits no distension. There is no tenderness.  Lymphadenopathy: She has no cervical adenopathy.  Skin: Skin is warm and dry. She is not diaphoretic.  Psychiatric: She has a normal mood and affect. Her behavior is  normal.        Assessment & Plan:   Physical exam: Screening blood work ordered Immunizations   Flu shot deferred, discussed shingrix Colonoscopy   Up to date  Mammogram  Due this month - scheduled Gyn  No longer seeing Dexa   Up to date  Eye exams    Up to date  Exercise   walks Weight   Normal MBI Skin   Sees derm annually Substance abuse  none  See Problem List for Assessment and Plan of chronic medical problems.   Follow-up in 1 year

## 2019-01-09 ENCOUNTER — Other Ambulatory Visit: Payer: Self-pay

## 2019-01-09 ENCOUNTER — Other Ambulatory Visit (INDEPENDENT_AMBULATORY_CARE_PROVIDER_SITE_OTHER): Payer: Medicare Other

## 2019-01-09 ENCOUNTER — Ambulatory Visit (INDEPENDENT_AMBULATORY_CARE_PROVIDER_SITE_OTHER): Payer: Medicare Other | Admitting: Internal Medicine

## 2019-01-09 ENCOUNTER — Encounter: Payer: Self-pay | Admitting: Internal Medicine

## 2019-01-09 VITALS — BP 118/78 | HR 60 | Temp 98.1°F | Ht 62.0 in | Wt 120.0 lb

## 2019-01-09 DIAGNOSIS — I10 Essential (primary) hypertension: Secondary | ICD-10-CM

## 2019-01-09 DIAGNOSIS — I6523 Occlusion and stenosis of bilateral carotid arteries: Secondary | ICD-10-CM

## 2019-01-09 DIAGNOSIS — M85859 Other specified disorders of bone density and structure, unspecified thigh: Secondary | ICD-10-CM

## 2019-01-09 DIAGNOSIS — L659 Nonscarring hair loss, unspecified: Secondary | ICD-10-CM

## 2019-01-09 DIAGNOSIS — E782 Mixed hyperlipidemia: Secondary | ICD-10-CM | POA: Diagnosis not present

## 2019-01-09 DIAGNOSIS — R7303 Prediabetes: Secondary | ICD-10-CM

## 2019-01-09 DIAGNOSIS — I700 Atherosclerosis of aorta: Secondary | ICD-10-CM

## 2019-01-09 DIAGNOSIS — Z0000 Encounter for general adult medical examination without abnormal findings: Secondary | ICD-10-CM

## 2019-01-09 LAB — CBC WITH DIFFERENTIAL/PLATELET
Basophils Absolute: 0.1 10*3/uL (ref 0.0–0.1)
Basophils Relative: 0.6 % (ref 0.0–3.0)
Eosinophils Absolute: 0.7 10*3/uL (ref 0.0–0.7)
Eosinophils Relative: 6.5 % — ABNORMAL HIGH (ref 0.0–5.0)
HCT: 39.7 % (ref 36.0–46.0)
Hemoglobin: 13.3 g/dL (ref 12.0–15.0)
Lymphocytes Relative: 19 % (ref 12.0–46.0)
Lymphs Abs: 2 10*3/uL (ref 0.7–4.0)
MCHC: 33.7 g/dL (ref 30.0–36.0)
MCV: 89.2 fl (ref 78.0–100.0)
Monocytes Absolute: 0.7 10*3/uL (ref 0.1–1.0)
Monocytes Relative: 6.9 % (ref 3.0–12.0)
Neutro Abs: 6.9 10*3/uL (ref 1.4–7.7)
Neutrophils Relative %: 67 % (ref 43.0–77.0)
Platelets: 309 10*3/uL (ref 150.0–400.0)
RBC: 4.45 Mil/uL (ref 3.87–5.11)
RDW: 14.3 % (ref 11.5–15.5)
WBC: 10.3 10*3/uL (ref 4.0–10.5)

## 2019-01-09 LAB — COMPREHENSIVE METABOLIC PANEL
ALT: 15 U/L (ref 0–35)
AST: 18 U/L (ref 0–37)
Albumin: 4.2 g/dL (ref 3.5–5.2)
Alkaline Phosphatase: 35 U/L — ABNORMAL LOW (ref 39–117)
BUN: 40 mg/dL — ABNORMAL HIGH (ref 6–23)
CO2: 28 mEq/L (ref 19–32)
Calcium: 10 mg/dL (ref 8.4–10.5)
Chloride: 104 mEq/L (ref 96–112)
Creatinine, Ser: 1.07 mg/dL (ref 0.40–1.20)
GFR: 50.25 mL/min — ABNORMAL LOW (ref >60.00)
Glucose, Bld: 98 mg/dL (ref 70–99)
Potassium: 4.6 mEq/L (ref 3.5–5.1)
Sodium: 140 mEq/L (ref 135–145)
Total Bilirubin: 0.5 mg/dL (ref 0.2–1.2)
Total Protein: 7.3 g/dL (ref 6.0–8.3)

## 2019-01-09 LAB — LIPID PANEL
Cholesterol: 161 mg/dL (ref 0–200)
HDL: 51.7 mg/dL (ref >39.00)
LDL Cholesterol: 100 mg/dL — ABNORMAL HIGH (ref 0–99)
NonHDL: 109.38
Total CHOL/HDL Ratio: 3
Triglycerides: 49 mg/dL (ref 0.0–149.0)
VLDL: 9.8 mg/dL (ref 0.0–40.0)

## 2019-01-09 LAB — HEMOGLOBIN A1C: Hgb A1c MFr Bld: 6.3 % (ref 4.6–6.5)

## 2019-01-09 LAB — TSH: TSH: 2.41 u[IU]/mL (ref 0.35–4.50)

## 2019-01-09 MED ORDER — ATENOLOL 50 MG PO TABS: 50.0000 mg | ORAL_TABLET | Freq: Every day | ORAL | 3 refills | Status: DC

## 2019-01-09 MED ORDER — FENOFIBRATE 145 MG PO TABS: 145.0000 mg | ORAL_TABLET | Freq: Every day | ORAL | 3 refills | Status: DC

## 2019-01-09 NOTE — Assessment & Plan Note (Signed)
Continue aspirin 81 mg daily Not able to take statins-continue fenofibrate

## 2019-01-09 NOTE — Assessment & Plan Note (Signed)
Check a1c Low sugar / carb diet Stressed regular exercise   

## 2019-01-09 NOTE — Assessment & Plan Note (Signed)
Not able to tolerate statins Continue fenofibrate Check lipid panel, CMP

## 2019-01-09 NOTE — Assessment & Plan Note (Signed)
Continue aspirin 81 mg daily Unable to take a statin-continue fenofibrate

## 2019-01-09 NOTE — Assessment & Plan Note (Signed)
BP well controlled Current regimen effective and well tolerated Continue current medications at current doses CMP 

## 2019-01-09 NOTE — Assessment & Plan Note (Signed)
Continues to have some thinning of her hair Taking biotin We will check TSH, CBC, iron panel Advised to discuss with dermatology when she sees her next

## 2019-01-09 NOTE — Assessment & Plan Note (Signed)
DEXA up-to-date Walking for exercise Continue calcium and vitamin D daily

## 2019-01-10 ENCOUNTER — Encounter: Payer: Self-pay | Admitting: Internal Medicine

## 2019-01-10 LAB — IRON,TIBC AND FERRITIN PANEL
%SAT: 14 % (calc) — ABNORMAL LOW (ref 16–45)
Ferritin: 107 ng/mL (ref 16–288)
Iron: 71 ug/dL (ref 45–160)
TIBC: 512 mcg/dL (calc) — ABNORMAL HIGH (ref 250–450)

## 2019-01-18 ENCOUNTER — Encounter: Payer: Self-pay | Admitting: Internal Medicine

## 2019-01-27 ENCOUNTER — Encounter: Payer: Self-pay | Admitting: Internal Medicine

## 2019-01-28 ENCOUNTER — Encounter: Payer: Self-pay | Admitting: Internal Medicine

## 2019-01-29 ENCOUNTER — Encounter: Payer: Self-pay | Admitting: Internal Medicine

## 2019-01-29 DIAGNOSIS — R928 Other abnormal and inconclusive findings on diagnostic imaging of breast: Secondary | ICD-10-CM

## 2019-02-06 ENCOUNTER — Encounter: Payer: Self-pay | Admitting: Internal Medicine

## 2019-02-06 ENCOUNTER — Telehealth: Payer: Self-pay | Admitting: Internal Medicine

## 2019-02-06 DIAGNOSIS — R928 Other abnormal and inconclusive findings on diagnostic imaging of breast: Secondary | ICD-10-CM

## 2019-02-06 LAB — HM MAMMOGRAPHY

## 2019-02-06 NOTE — Telephone Encounter (Signed)
I just ordered this - I think I ordered it right.Marland KitchenMarland KitchenMarland Kitchen

## 2019-02-06 NOTE — Telephone Encounter (Signed)
Order faxed to [facility]. Called them and gave them my direct line in case it needs reordered.

## 2019-02-06 NOTE — Telephone Encounter (Signed)
Caller name: Manuela Schwartz  Relation to pt: [facility] Call back number: [phone] and fax # [phone]  Reason for call:  Patent scheduled for 02/13/2019 requesting orders for stereotatic R92.8 requesting orders due to abnormal mamo.

## 2019-02-07 ENCOUNTER — Encounter: Payer: Self-pay | Admitting: Internal Medicine

## 2019-02-12 ENCOUNTER — Encounter: Payer: Self-pay | Admitting: Internal Medicine

## 2019-02-13 ENCOUNTER — Encounter: Payer: Self-pay | Admitting: Internal Medicine

## 2019-02-14 ENCOUNTER — Encounter: Payer: Self-pay | Admitting: Internal Medicine

## 2019-03-21 ENCOUNTER — Other Ambulatory Visit: Payer: Self-pay | Admitting: Neurosurgery

## 2019-03-21 DIAGNOSIS — M5020 Other cervical disc displacement, unspecified cervical region: Secondary | ICD-10-CM

## 2019-03-21 HISTORY — DX: Other cervical disc displacement, unspecified cervical region: M50.20

## 2019-05-06 ENCOUNTER — Encounter: Payer: Self-pay | Admitting: Internal Medicine

## 2019-05-07 MED ORDER — ICOSAPENT ETHYL 1 G PO CAPS: 2.0000 g | ORAL_CAPSULE | Freq: Two times a day (BID) | ORAL | 1 refills | Status: DC

## 2019-05-08 NOTE — Progress Notes (Signed)
[id number] - [place], [place] [address] Phone: [phone] Fax: [phone]      Your procedure is scheduled on Wednesday, May 14, 2019.  Report to [facility] Main Entrance "A" at 6:30 A.M., and check in at the Admitting office.  Call this number if you have problems the morning of surgery:  [phone]    Remember:  Do not eat or drink after midnight the night before your surgery   Do not take any medications the morning of surgery.  7 days prior to surgery STOP taking any Aspirin (unless otherwise instructed by your surgeon), Aleve, Naproxen, Ibuprofen, Motrin, Advil, Goody's, BC's, all herbal medications, fish oil, and all vitamins.    The Morning of Surgery  Do not wear jewelry, make-up or nail polish.  Do not wear lotions, powders, or perfumes/colognes, or deodorant  Do not shave 48 hours prior to surgery.  Do not bring valuables to the hospital.  [facility] is not responsible for any belongings or valuables.  If you are a smoker, DO NOT Smoke 24 hours prior to surgery  If you wear a CPAP at night please bring your mask, tubing, and machine the morning of surgery   Remember that you must have someone to transport you home after your surgery, and remain with you for 24 hours if you are discharged the same day.   Please bring cases for contacts, glasses, hearing aids, dentures or bridgework because it cannot be worn into surgery.    Leave your suitcase in the car.  After surgery it may be brought to your room.  For patients admitted to the hospital, discharge time will be determined by your treatment team.  Patients discharged the day of surgery will not be allowed to drive home.    Special instructions:   [facility]- Preparing For Surgery  Before surgery, you can play an important role. Because skin is not sterile, your skin needs to be as free of germs as possible. You can  reduce the number of germs on your skin by washing with CHG (chlorahexidine gluconate) Soap before surgery.  CHG is an antiseptic cleaner which kills germs and bonds with the skin to continue killing germs even after washing.    Oral Hygiene is also important to reduce your risk of infection.  Remember - BRUSH YOUR TEETH THE MORNING OF SURGERY WITH YOUR REGULAR TOOTHPASTE  Please do not use if you have an allergy to CHG or antibacterial soaps. If your skin becomes reddened/irritated stop using the CHG.  Do not shave (including legs and underarms) for at least 48 hours prior to first CHG shower. It is OK to shave your face.  Please follow these instructions carefully.   1. Shower the NIGHT BEFORE SURGERY and the MORNING OF SURGERY with CHG Soap.   2. If you chose to wash your hair, wash your hair first as usual with your normal shampoo.  3. After you shampoo, rinse your hair and body thoroughly to remove the shampoo.  4. Use CHG as you would any other liquid soap. You can apply CHG directly to the skin and wash gently with a scrungie or a clean washcloth.   5. Apply the CHG Soap to your body ONLY FROM THE NECK DOWN.  Do not use on open wounds or open sores. Avoid contact with your eyes, ears, mouth and genitals (private parts). Wash Face and genitals (private parts)  with  your normal soap.   6. Wash thoroughly, paying special attention to the area where your surgery will be performed.  7. Thoroughly rinse your body with warm water from the neck down.  8. DO NOT shower/wash with your normal soap after using and rinsing off the CHG Soap.  9. Pat yourself dry with a CLEAN TOWEL.  10. Wear CLEAN PAJAMAS to bed the night before surgery, wear comfortable clothes the morning of surgery  11. Place CLEAN SHEETS on your bed the night of your first shower and DO NOT SLEEP WITH PETS.    Day of Surgery:  Please shower the morning of surgery with the CHG soap Do not apply any  deodorants/lotions. Please wear clean clothes to the hospital/surgery center.   Remember to brush your teeth WITH YOUR REGULAR TOOTHPASTE.   Please read over the following fact sheets that you were given.

## 2019-05-12 ENCOUNTER — Other Ambulatory Visit (HOSPITAL_COMMUNITY)
Admission: RE | Admit: 2019-05-12 | Discharge: 2019-05-12 | Disposition: A | Discharge status: Home / Self Care | Payer: Medicare Other | Source: Ambulatory Visit | Attending: Neurosurgery | Admitting: Neurosurgery

## 2019-05-12 ENCOUNTER — Encounter (HOSPITAL_COMMUNITY): Payer: Self-pay

## 2019-05-12 ENCOUNTER — Encounter (HOSPITAL_COMMUNITY)
Admission: RE | Admit: 2019-05-12 | Discharge: 2019-05-12 | Disposition: A | Discharge status: Home / Self Care | Payer: Medicare Other | Source: Ambulatory Visit | Attending: Neurosurgery | Admitting: Neurosurgery

## 2019-05-12 ENCOUNTER — Other Ambulatory Visit: Payer: Self-pay

## 2019-05-12 DIAGNOSIS — R001 Bradycardia, unspecified: Secondary | ICD-10-CM | POA: Insufficient documentation

## 2019-05-12 DIAGNOSIS — I10 Essential (primary) hypertension: Secondary | ICD-10-CM | POA: Diagnosis not present

## 2019-05-12 DIAGNOSIS — Z01812 Encounter for preprocedural laboratory examination: Secondary | ICD-10-CM | POA: Diagnosis present

## 2019-05-12 DIAGNOSIS — M5020 Other cervical disc displacement, unspecified cervical region: Secondary | ICD-10-CM | POA: Insufficient documentation

## 2019-05-12 DIAGNOSIS — Z20822 Contact with and (suspected) exposure to covid-19: Secondary | ICD-10-CM | POA: Diagnosis not present

## 2019-05-12 DIAGNOSIS — Z01818 Encounter for other preprocedural examination: Secondary | ICD-10-CM | POA: Diagnosis present

## 2019-05-12 HISTORY — DX: Fatty (change of) liver, not elsewhere classified: K76.0

## 2019-05-12 LAB — BASIC METABOLIC PANEL
Anion gap: 9 (ref 5–15)
BUN: 17 mg/dL (ref 8–23)
CO2: 27 mmol/L (ref 22–32)
Calcium: 10.6 mg/dL — ABNORMAL HIGH (ref 8.9–10.3)
Chloride: 106 mmol/L (ref 98–111)
Creatinine, Ser: 0.89 mg/dL (ref 0.44–1.00)
GFR calc Af Amer: >60 mL/min (ref >60)
GFR calc non Af Amer: >60 mL/min (ref >60)
Glucose, Bld: 92 mg/dL (ref 70–99)
Potassium: 4.1 mmol/L (ref 3.5–5.1)
Sodium: 142 mmol/L (ref 135–145)

## 2019-05-12 LAB — SARS CORONAVIRUS 2 (TAT 6-24 HRS): SARS Coronavirus 2: NEGATIVE

## 2019-05-12 LAB — CBC
HCT: 41 % (ref 36.0–46.0)
Hemoglobin: 12.9 g/dL (ref 12.0–15.0)
MCH: 29.1 pg (ref 26.0–34.0)
MCHC: 31.5 g/dL (ref 30.0–36.0)
MCV: 92.6 fL (ref 80.0–100.0)
Platelets: 279 10*3/uL (ref 150–400)
RBC: 4.43 MIL/uL (ref 3.87–5.11)
RDW: 14.1 % (ref 11.5–15.5)
WBC: 6.1 10*3/uL (ref 4.0–10.5)
nRBC: 0 % (ref 0.0–0.2)

## 2019-05-12 NOTE — Progress Notes (Signed)
PCP - Billey Gosling Cardiologist - denies  Chest x-ray - N/A EKG - 05/12/19 Stress Test - denies ECHO - denies Cardiac Cath - denies  Sleep Study - 40+ years ago CPAP - does not use   Aspirin Instructions: last dose of ASA 05/08/19. Patient instructed to hold all NSAID's, herbal medications, fish oil and vitamins 7 days prior to surgery.   Anesthesia review:   Patient denies shortness of breath, fever, cough and chest pain at PAT appointment   Patient verbalized understanding of instructions that were given to them at the PAT appointment. Patient was also instructed that they will need to review over the PAT instructions again at home before surgery.

## 2019-05-12 NOTE — Progress Notes (Signed)
[id number] - [place], [address] [place] [address] Phone: [phone] Fax: [phone]      Your procedure is scheduled on Wednesday January 6th.  Report to [facility] Main Entrance "A" at 6:30 A.M., and check in at the Admitting office.  Call this number if you have problems the morning of surgery:  [phone]  Call [phone] if you have any questions prior to your surgery date Monday-Friday 8am-4pm    Remember:  Do not eat or drink after midnight the night before your surgery     Take these medicines the morning of surgery with A SIP OF WATER - NONE  Follow your surgeon's instructions on when to stop Asprin.  If no instructions were given by your surgeon then you will need to call the office to get those instructions.     As of today, STOP taking any Aspirin (unless otherwise instructed by your surgeon), Aleve, Naproxen, Ibuprofen, Motrin, Advil, Goody's, BC's, all herbal medications, fish oil, and all vitamins.    The Morning of Surgery  Do not wear jewelry, make-up or nail polish.  Do not wear lotions, powders, or perfumes/colognes, or deodorant  Do not shave 48 hours prior to surgery.  Men may shave face and neck.  Do not bring valuables to the hospital.  [facility] is not responsible for any belongings or valuables.  If you are a smoker, DO NOT Smoke 24 hours prior to surgery  If you wear a CPAP at night please bring your mask, tubing, and machine the morning of surgery   Remember that you must have someone to transport you home after your surgery, and remain with you for 24 hours if you are discharged the same day.   Please bring cases for contacts, glasses, hearing aids, dentures or bridgework because it cannot be worn into surgery.    Leave your suitcase in the car.  After surgery it may be brought to your room.  For patients admitted to the hospital, discharge time will be  determined by your treatment team.  Patients discharged the day of surgery will not be allowed to drive home.    Special instructions:   [place]- Preparing For Surgery  Before surgery, you can play an important role. Because skin is not sterile, your skin needs to be as free of germs as possible. You can reduce the number of germs on your skin by washing with CHG (chlorahexidine gluconate) Soap before surgery.  CHG is an antiseptic cleaner which kills germs and bonds with the skin to continue killing germs even after washing.    Oral Hygiene is also important to reduce your risk of infection.  Remember - BRUSH YOUR TEETH THE MORNING OF SURGERY WITH YOUR REGULAR TOOTHPASTE  Please do not use if you have an allergy to CHG or antibacterial soaps. If your skin becomes reddened/irritated stop using the CHG.  Do not shave (including legs and underarms) for at least 48 hours prior to first CHG shower. It is OK to shave your face.  Please follow these instructions carefully.   1. Shower the NIGHT BEFORE SURGERY and the MORNING OF SURGERY with CHG Soap.   2. If you chose to wash your hair, wash your hair first as usual with your normal shampoo.  3. After you shampoo, rinse your hair and body thoroughly to remove the shampoo.  4. Use CHG as you would any other liquid soap. You can apply  CHG directly to the skin and wash gently with a scrungie or a clean washcloth.   5. Apply the CHG Soap to your body ONLY FROM THE NECK DOWN.  Do not use on open wounds or open sores. Avoid contact with your eyes, ears, mouth and genitals (private parts). Wash Face and genitals (private parts)  with your normal soap.   6. Wash thoroughly, paying special attention to the area where your surgery will be performed.  7. Thoroughly rinse your body with warm water from the neck down.  8. DO NOT shower/wash with your normal soap after using and rinsing off the CHG Soap.  9. Pat yourself dry with a CLEAN  TOWEL.  10. Wear CLEAN PAJAMAS to bed the night before surgery, wear comfortable clothes the morning of surgery  11. Place CLEAN SHEETS on your bed the night of your first shower and DO NOT SLEEP WITH PETS.    Day of Surgery:  Please shower the morning of surgery with the CHG soap Do not apply any deodorants/lotions. Please wear clean clothes to the hospital/surgery center.   Remember to brush your teeth WITH YOUR REGULAR TOOTHPASTE.   Please read over the following fact sheets that you were given.

## 2019-05-13 ENCOUNTER — Other Ambulatory Visit: Payer: Self-pay | Admitting: Neurosurgery

## 2019-05-13 NOTE — Anesthesia Preprocedure Evaluation (Addendum)
Anesthesia Evaluation  Patient identified by MRN, date of birth, ID band Patient awake    Reviewed: Allergy & Precautions, NPO status , Patient's Chart, lab work & pertinent test results, reviewed documented beta blocker date and time   History of Anesthesia Complications Negative for: history of anesthetic complications  Airway Mallampati: II  TM Distance: >3 FB Neck ROM: Full    Dental  (+) Dental Advisory Given, Teeth Intact   Pulmonary neg pulmonary ROS,    Pulmonary exam normal        Cardiovascular hypertension, Pt. on home beta blockers and Pt. on medications Normal cardiovascular exam   '20 Carotid US - 1-39% b/l ICAS    Neuro/Psych negative psych ROS   GI/Hepatic negative GI ROS, Neg liver ROS,   Endo/Other  negative endocrine ROS  Renal/GU negative Renal ROS     Musculoskeletal negative musculoskeletal ROS (+)   Abdominal   Peds  Hematology negative hematology ROS (+)   Anesthesia Other Findings Covid neg 05/12/19   Reproductive/Obstetrics                            Anesthesia Physical Anesthesia Plan  ASA: II  Anesthesia Plan: General   Post-op Pain Management:    Induction: Intravenous  PONV Risk Score and Plan: 4 or greater and Treatment may vary due to age or medical condition, Ondansetron and Dexamethasone  Airway Management Planned: Oral ETT and Video Laryngoscope Planned  Additional Equipment: None  Intra-op Plan:   Post-operative Plan: Extubation in OR  Informed Consent: I have reviewed the patients History and Physical, chart, labs and discussed the procedure including the risks, benefits and alternatives for the proposed anesthesia with the patient or authorized representative who has indicated his/her understanding and acceptance.     Dental advisory given  Plan Discussed with: CRNA and Anesthesiologist  Anesthesia Plan Comments:         Anesthesia Quick Evaluation

## 2019-05-14 ENCOUNTER — Ambulatory Visit (HOSPITAL_COMMUNITY): Payer: Medicare Other | Admitting: Anesthesiology

## 2019-05-14 ENCOUNTER — Encounter (HOSPITAL_COMMUNITY): Payer: Self-pay | Admitting: Neurosurgery

## 2019-05-14 ENCOUNTER — Observation Stay (HOSPITAL_COMMUNITY)
Admission: RE | Admit: 2019-05-14 | Discharge: 2019-05-15 | Disposition: A | Discharge status: Home / Self Care | Payer: Medicare Other | Attending: Neurosurgery | Admitting: Neurosurgery

## 2019-05-14 ENCOUNTER — Other Ambulatory Visit: Payer: Self-pay

## 2019-05-14 ENCOUNTER — Ambulatory Visit (HOSPITAL_COMMUNITY): Payer: Medicare Other

## 2019-05-14 ENCOUNTER — Encounter (HOSPITAL_COMMUNITY)
Admission: RE | Disposition: A | Discharge status: Home / Self Care | Payer: Self-pay | Source: Home / Self Care | Attending: Neurosurgery

## 2019-05-14 DIAGNOSIS — M5020 Other cervical disc displacement, unspecified cervical region: Secondary | ICD-10-CM

## 2019-05-14 DIAGNOSIS — M5023 Other cervical disc displacement, cervicothoracic region: Secondary | ICD-10-CM | POA: Insufficient documentation

## 2019-05-14 DIAGNOSIS — M8580 Other specified disorders of bone density and structure, unspecified site: Secondary | ICD-10-CM | POA: Insufficient documentation

## 2019-05-14 DIAGNOSIS — E559 Vitamin D deficiency, unspecified: Secondary | ICD-10-CM | POA: Insufficient documentation

## 2019-05-14 DIAGNOSIS — M4722 Other spondylosis with radiculopathy, cervical region: Secondary | ICD-10-CM | POA: Diagnosis not present

## 2019-05-14 DIAGNOSIS — Z79899 Other long term (current) drug therapy: Secondary | ICD-10-CM | POA: Diagnosis not present

## 2019-05-14 DIAGNOSIS — I10 Essential (primary) hypertension: Secondary | ICD-10-CM | POA: Insufficient documentation

## 2019-05-14 DIAGNOSIS — Z833 Family history of diabetes mellitus: Secondary | ICD-10-CM | POA: Diagnosis not present

## 2019-05-14 DIAGNOSIS — Z419 Encounter for procedure for purposes other than remedying health state, unspecified: Secondary | ICD-10-CM

## 2019-05-14 DIAGNOSIS — Z7982 Long term (current) use of aspirin: Secondary | ICD-10-CM | POA: Insufficient documentation

## 2019-05-14 DIAGNOSIS — Z8249 Family history of ischemic heart disease and other diseases of the circulatory system: Secondary | ICD-10-CM | POA: Insufficient documentation

## 2019-05-14 DIAGNOSIS — M50122 Cervical disc disorder at C5-C6 level with radiculopathy: Secondary | ICD-10-CM | POA: Diagnosis not present

## 2019-05-14 DIAGNOSIS — E785 Hyperlipidemia, unspecified: Secondary | ICD-10-CM | POA: Insufficient documentation

## 2019-05-14 DIAGNOSIS — R7303 Prediabetes: Secondary | ICD-10-CM | POA: Insufficient documentation

## 2019-05-14 HISTORY — PX: ANTERIOR CERVICAL DECOMP/DISCECTOMY FUSION: SHX1161

## 2019-05-14 HISTORY — DX: Other cervical disc displacement, unspecified cervical region: M50.20

## 2019-05-14 SURGERY — ANTERIOR CERVICAL DECOMPRESSION/DISCECTOMY FUSION 3 LEVELS
Anesthesia: General | Site: Spine Cervical

## 2019-05-14 MED ORDER — CHLORHEXIDINE GLUCONATE CLOTH 2 % EX PADS: 6.0000 | MEDICATED_PAD | Freq: Once | CUTANEOUS | Status: DC

## 2019-05-14 MED ORDER — DEXAMETHASONE SODIUM PHOSPHATE 10 MG/ML IJ SOLN
INTRAMUSCULAR | Status: AC
  Filled 2019-05-14: qty 1

## 2019-05-14 MED ORDER — SUCCINYLCHOLINE CHLORIDE 200 MG/10ML IV SOSY
PREFILLED_SYRINGE | INTRAVENOUS | Status: AC
  Filled 2019-05-14: qty 10

## 2019-05-14 MED ORDER — ONDANSETRON HCL 4 MG/2ML IJ SOLN
INTRAMUSCULAR | Status: AC
  Filled 2019-05-14: qty 2

## 2019-05-14 MED ORDER — ACETAMINOPHEN 10 MG/ML IV SOLN
INTRAVENOUS | Status: AC
  Filled 2019-05-14: qty 100

## 2019-05-14 MED ORDER — SODIUM CHLORIDE 0.9 % IV SOLN: 250.0000 mL | INTRAVENOUS | Status: DC

## 2019-05-14 MED ORDER — ONDANSETRON HCL 4 MG PO TABS: 4.0000 mg | ORAL_TABLET | Freq: Four times a day (QID) | ORAL | Status: DC | PRN

## 2019-05-14 MED ORDER — BISACODYL 10 MG RE SUPP: 10.0000 mg | Freq: Every day | RECTAL | Status: DC | PRN

## 2019-05-14 MED ORDER — FENTANYL CITRATE (PF) 250 MCG/5ML IJ SOLN
INTRAMUSCULAR | Status: AC
  Filled 2019-05-14: qty 5

## 2019-05-14 MED ORDER — PROPOFOL 10 MG/ML IV BOLUS
INTRAVENOUS | Status: AC
  Filled 2019-05-14: qty 20

## 2019-05-14 MED ORDER — MAGNESIUM HYDROXIDE 400 MG/5ML PO SUSP: 30.0000 mL | Freq: Every day | ORAL | Status: DC | PRN

## 2019-05-14 MED ORDER — PHENOL 1.4 % MT LIQD: 1.0000 | OROMUCOSAL | Status: DC | PRN

## 2019-05-14 MED ORDER — SODIUM CHLORIDE 0.9% FLUSH: 3.0000 mL | INTRAVENOUS | Status: DC | PRN

## 2019-05-14 MED ORDER — 0.9 % SODIUM CHLORIDE (POUR BTL) OPTIME
TOPICAL | Status: DC | PRN
  Administered 2019-05-14: 1000 mL

## 2019-05-14 MED ORDER — HYDROCODONE-ACETAMINOPHEN 5-325 MG PO TABS: 1.0000 | ORAL_TABLET | ORAL | Status: DC | PRN

## 2019-05-14 MED ORDER — BUPIVACAINE HCL (PF) 0.5 % IJ SOLN
INTRAMUSCULAR | Status: AC
  Filled 2019-05-14: qty 30

## 2019-05-14 MED ORDER — ROCURONIUM BROMIDE 10 MG/ML (PF) SYRINGE
PREFILLED_SYRINGE | INTRAVENOUS | Status: AC
  Filled 2019-05-14: qty 10

## 2019-05-14 MED ORDER — SODIUM CHLORIDE 0.9 % IV SOLN: INTRAVENOUS | Status: DC | PRN

## 2019-05-14 MED ORDER — KETOROLAC TROMETHAMINE 30 MG/ML IJ SOLN
15.0000 mg | Freq: Once | INTRAMUSCULAR | Status: AC
  Administered 2019-05-14: 15 mg via INTRAVENOUS

## 2019-05-14 MED ORDER — FENTANYL CITRATE (PF) 100 MCG/2ML IJ SOLN
25.0000 ug | INTRAMUSCULAR | Status: DC | PRN
  Administered 2019-05-14: 25 ug via INTRAVENOUS

## 2019-05-14 MED ORDER — LIDOCAINE 2% (20 MG/ML) 5 ML SYRINGE
INTRAMUSCULAR | Status: AC
  Filled 2019-05-14: qty 5

## 2019-05-14 MED ORDER — HYDROXYZINE HCL 25 MG PO TABS: 50.0000 mg | ORAL_TABLET | ORAL | Status: DC | PRN

## 2019-05-14 MED ORDER — PHENYLEPHRINE HCL-NACL 10-0.9 MG/250ML-% IV SOLN
INTRAVENOUS | Status: DC | PRN
  Administered 2019-05-14: 15 ug/min via INTRAVENOUS

## 2019-05-14 MED ORDER — ACETAMINOPHEN 325 MG PO TABS: 650.0000 mg | ORAL_TABLET | ORAL | Status: DC | PRN

## 2019-05-14 MED ORDER — LIDOCAINE 2% (20 MG/ML) 5 ML SYRINGE
INTRAMUSCULAR | Status: DC | PRN
  Administered 2019-05-14: 60 mg via INTRAVENOUS

## 2019-05-14 MED ORDER — ACETAMINOPHEN 650 MG RE SUPP: 650.0000 mg | RECTAL | Status: DC | PRN

## 2019-05-14 MED ORDER — FLEET ENEMA 7-19 GM/118ML RE ENEM: 1.0000 | ENEMA | Freq: Once | RECTAL | Status: DC | PRN

## 2019-05-14 MED ORDER — ATENOLOL 50 MG PO TABS
50.0000 mg | ORAL_TABLET | Freq: Every day | ORAL | Status: DC
  Administered 2019-05-14: 50 mg via ORAL
  Filled 2019-05-14: qty 1

## 2019-05-14 MED ORDER — ACETAMINOPHEN 10 MG/ML IV SOLN
INTRAVENOUS | Status: DC | PRN
  Administered 2019-05-14: 1000 mg via INTRAVENOUS

## 2019-05-14 MED ORDER — KCL IN DEXTROSE-NACL 20-5-0.45 MEQ/L-%-% IV SOLN: INTRAVENOUS | Status: DC

## 2019-05-14 MED ORDER — CEFAZOLIN SODIUM-DEXTROSE 2-4 GM/100ML-% IV SOLN
INTRAVENOUS | Status: AC
  Filled 2019-05-14: qty 100

## 2019-05-14 MED ORDER — KETOROLAC TROMETHAMINE 30 MG/ML IJ SOLN
INTRAMUSCULAR | Status: AC
  Filled 2019-05-14: qty 1

## 2019-05-14 MED ORDER — THROMBIN 5000 UNITS EX SOLR
CUTANEOUS | Status: AC
  Filled 2019-05-14: qty 5000

## 2019-05-14 MED ORDER — MENTHOL 3 MG MT LOZG
1.0000 | LOZENGE | OROMUCOSAL | Status: DC | PRN
  Filled 2019-05-14: qty 9

## 2019-05-14 MED ORDER — ONDANSETRON HCL 4 MG/2ML IJ SOLN: 4.0000 mg | Freq: Once | INTRAMUSCULAR | Status: DC | PRN

## 2019-05-14 MED ORDER — THROMBIN 20000 UNITS EX SOLR: CUTANEOUS | Status: DC | PRN

## 2019-05-14 MED ORDER — ROCURONIUM BROMIDE 10 MG/ML (PF) SYRINGE
PREFILLED_SYRINGE | INTRAVENOUS | Status: DC | PRN
  Administered 2019-05-14: 60 mg via INTRAVENOUS
  Administered 2019-05-14: 20 mg via INTRAVENOUS

## 2019-05-14 MED ORDER — LIDOCAINE-EPINEPHRINE 1 %-1:100000 IJ SOLN
INTRAMUSCULAR | Status: DC | PRN
  Administered 2019-05-14: 5 mL

## 2019-05-14 MED ORDER — LACTATED RINGERS IV SOLN: INTRAVENOUS | Status: DC | PRN

## 2019-05-14 MED ORDER — LIDOCAINE-EPINEPHRINE 1 %-1:100000 IJ SOLN
INTRAMUSCULAR | Status: AC
  Filled 2019-05-14: qty 1

## 2019-05-14 MED ORDER — MORPHINE SULFATE (PF) 4 MG/ML IV SOLN: 4.0000 mg | INTRAVENOUS | Status: DC | PRN

## 2019-05-14 MED ORDER — FENTANYL CITRATE (PF) 100 MCG/2ML IJ SOLN
INTRAMUSCULAR | Status: AC
  Filled 2019-05-14: qty 2

## 2019-05-14 MED ORDER — CEFAZOLIN SODIUM-DEXTROSE 2-4 GM/100ML-% IV SOLN
2.0000 g | INTRAVENOUS | Status: AC
  Administered 2019-05-14: 09:00:00 2 g via INTRAVENOUS

## 2019-05-14 MED ORDER — ONDANSETRON HCL 4 MG/2ML IJ SOLN
INTRAMUSCULAR | Status: DC | PRN
  Administered 2019-05-14: 4 mg via INTRAVENOUS

## 2019-05-14 MED ORDER — CYCLOBENZAPRINE HCL 5 MG PO TABS: 5.0000 mg | ORAL_TABLET | Freq: Three times a day (TID) | ORAL | Status: DC | PRN

## 2019-05-14 MED ORDER — EPHEDRINE 5 MG/ML INJ
INTRAVENOUS | Status: AC
  Filled 2019-05-14: qty 10

## 2019-05-14 MED ORDER — ONDANSETRON HCL 4 MG/2ML IJ SOLN: 4.0000 mg | Freq: Four times a day (QID) | INTRAMUSCULAR | Status: DC | PRN

## 2019-05-14 MED ORDER — THROMBIN 20000 UNITS EX SOLR
CUTANEOUS | Status: AC
  Filled 2019-05-14: qty 20000

## 2019-05-14 MED ORDER — PHENYLEPHRINE 40 MCG/ML (10ML) SYRINGE FOR IV PUSH (FOR BLOOD PRESSURE SUPPORT)
PREFILLED_SYRINGE | INTRAVENOUS | Status: AC
  Filled 2019-05-14: qty 10

## 2019-05-14 MED ORDER — PROPOFOL 10 MG/ML IV BOLUS
INTRAVENOUS | Status: DC | PRN
  Administered 2019-05-14: 140 mg via INTRAVENOUS

## 2019-05-14 MED ORDER — KETOROLAC TROMETHAMINE 30 MG/ML IJ SOLN
15.0000 mg | Freq: Four times a day (QID) | INTRAMUSCULAR | Status: DC
  Administered 2019-05-14 – 2019-05-15 (×3): 15 mg via INTRAVENOUS
  Filled 2019-05-14 (×3): qty 1

## 2019-05-14 MED ORDER — OXYCODONE HCL 5 MG/5ML PO SOLN: 5.0000 mg | Freq: Once | ORAL | Status: AC | PRN

## 2019-05-14 MED ORDER — HYDROXYZINE HCL 50 MG/ML IM SOLN: 50.0000 mg | INTRAMUSCULAR | Status: DC | PRN

## 2019-05-14 MED ORDER — FENTANYL CITRATE (PF) 250 MCG/5ML IJ SOLN
INTRAMUSCULAR | Status: DC | PRN
  Administered 2019-05-14 (×3): 50 ug via INTRAVENOUS

## 2019-05-14 MED ORDER — SUGAMMADEX SODIUM 200 MG/2ML IV SOLN
INTRAVENOUS | Status: DC | PRN
  Administered 2019-05-14: 150 mg via INTRAVENOUS

## 2019-05-14 MED ORDER — ALUM & MAG HYDROXIDE-SIMETH 200-200-20 MG/5ML PO SUSP: 30.0000 mL | Freq: Four times a day (QID) | ORAL | Status: DC | PRN

## 2019-05-14 MED ORDER — OXYCODONE HCL 5 MG PO TABS
5.0000 mg | ORAL_TABLET | Freq: Once | ORAL | Status: AC | PRN
  Administered 2019-05-14: 5 mg via ORAL

## 2019-05-14 MED ORDER — DEXAMETHASONE SODIUM PHOSPHATE 10 MG/ML IJ SOLN
INTRAMUSCULAR | Status: DC | PRN
  Administered 2019-05-14: 10 mg via INTRAVENOUS

## 2019-05-14 MED ORDER — THROMBIN 5000 UNITS EX SOLR: OROMUCOSAL | Status: DC | PRN

## 2019-05-14 MED ORDER — OXYCODONE HCL 5 MG PO TABS
ORAL_TABLET | ORAL | Status: AC
  Filled 2019-05-14: qty 1

## 2019-05-14 MED ORDER — SODIUM CHLORIDE 0.9% FLUSH
3.0000 mL | Freq: Two times a day (BID) | INTRAVENOUS | Status: DC
  Administered 2019-05-14: 3 mL via INTRAVENOUS

## 2019-05-14 MED ORDER — BUPIVACAINE HCL (PF) 0.5 % IJ SOLN
INTRAMUSCULAR | Status: DC | PRN
  Administered 2019-05-14: 5 mL

## 2019-05-14 SURGICAL SUPPLY — 56 items
ADH SKN CLS APL DERMABOND .7 (GAUZE/BANDAGES/DRESSINGS) ×1
ALLOGRAFT CA 6X14X11 (Bone Implant) ×3 IMPLANT
BAG DECANTER FOR FLEXI CONT (MISCELLANEOUS) ×2 IMPLANT
BAND INSRT 18 STRL LF DISP RB (MISCELLANEOUS) ×2
BAND RUBBER #18 3X1/16 STRL (MISCELLANEOUS) ×4 IMPLANT
BIT DRILL 13 (BIT) ×1 IMPLANT
BIT DRILL NEURO 2X3.1 SFT TUCH (MISCELLANEOUS) ×1 IMPLANT
CANISTER SUCT 3000ML PPV (MISCELLANEOUS) ×2 IMPLANT
CARTRIDGE OIL MAESTRO DRILL (MISCELLANEOUS) ×1 IMPLANT
COVER MAYO STAND STRL (DRAPES) ×2 IMPLANT
DERMABOND ADVANCED (GAUZE/BANDAGES/DRESSINGS) ×1
DERMABOND ADVANCED .7 DNX12 (GAUZE/BANDAGES/DRESSINGS) ×1 IMPLANT
DIFFUSER DRILL AIR PNEUMATIC (MISCELLANEOUS) ×2 IMPLANT
DRAPE HALF SHEET 40X57 (DRAPES) IMPLANT
DRAPE LAPAROTOMY 100X72 PEDS (DRAPES) ×2 IMPLANT
DRAPE MICROSCOPE LEICA (MISCELLANEOUS) ×2 IMPLANT
DRILL NEURO 2X3.1 SOFT TOUCH (MISCELLANEOUS) ×2
ELECT COATED BLADE 2.86 ST (ELECTRODE) ×2 IMPLANT
ELECT REM PT RETURN 9FT ADLT (ELECTROSURGICAL) ×2
ELECTRODE REM PT RTRN 9FT ADLT (ELECTROSURGICAL) ×1 IMPLANT
GAUZE SPONGE 4X4 12PLY STRL (GAUZE/BANDAGES/DRESSINGS) ×1 IMPLANT
GLOVE BIO SURGEON STRL SZ7.5 (GLOVE) ×1 IMPLANT
GLOVE BIOGEL PI IND STRL 7.5 (GLOVE) IMPLANT
GLOVE BIOGEL PI IND STRL 8 (GLOVE) ×1 IMPLANT
GLOVE BIOGEL PI INDICATOR 7.5 (GLOVE) ×3
GLOVE BIOGEL PI INDICATOR 8 (GLOVE) ×3
GLOVE ECLIPSE 7.5 STRL STRAW (GLOVE) ×4 IMPLANT
GOWN STRL REUS W/ TWL LRG LVL3 (GOWN DISPOSABLE) IMPLANT
GOWN STRL REUS W/ TWL XL LVL3 (GOWN DISPOSABLE) ×1 IMPLANT
GOWN STRL REUS W/TWL 2XL LVL3 (GOWN DISPOSABLE) ×2 IMPLANT
GOWN STRL REUS W/TWL LRG LVL3 (GOWN DISPOSABLE)
GOWN STRL REUS W/TWL XL LVL3 (GOWN DISPOSABLE) ×4
HALTER HD/CHIN CERV TRACTION D (MISCELLANEOUS) ×2 IMPLANT
HEMOSTAT POWDER KIT SURGIFOAM (HEMOSTASIS) ×2 IMPLANT
KIT BASIN OR (CUSTOM PROCEDURE TRAY) ×2 IMPLANT
KIT TURNOVER KIT B (KITS) ×2 IMPLANT
NDL HYPO 25GX1X1/2 BEV (NEEDLE) ×1 IMPLANT
NDL SPNL 22GX3.5 QUINCKE BK (NEEDLE) ×1 IMPLANT
NEEDLE HYPO 25GX1X1/2 BEV (NEEDLE) ×2 IMPLANT
NEEDLE SPNL 22GX3.5 QUINCKE BK (NEEDLE) ×4 IMPLANT
NS IRRIG 1000ML POUR BTL (IV SOLUTION) ×2 IMPLANT
OIL CARTRIDGE MAESTRO DRILL (MISCELLANEOUS) ×2
PACK LAMINECTOMY NEURO (CUSTOM PROCEDURE TRAY) ×2 IMPLANT
PLATE 3 55XLCK NS SPNE CVD (Plate) IMPLANT
PLATE 3 ATLANTIS TRANS (Plate) ×2 IMPLANT
SCREW 4.5X13MM (Screw) ×2 IMPLANT
SCREW ST 14X4XST FXANG SPNE (Screw) IMPLANT
SCREW ST FIX 4 ATL (Screw) ×12 IMPLANT
SPONGE INTESTINAL PEANUT (DISPOSABLE) ×2 IMPLANT
SPONGE SURGIFOAM ABS GEL 100 (HEMOSTASIS) ×2 IMPLANT
SUT VIC AB 2-0 CP2 18 (SUTURE) ×2 IMPLANT
SUT VIC AB 3-0 SH 8-18 (SUTURE) ×2 IMPLANT
TAPE CLOTH SURG 4X10 WHT LF (GAUZE/BANDAGES/DRESSINGS) ×1 IMPLANT
TOWEL GREEN STERILE (TOWEL DISPOSABLE) ×2 IMPLANT
TOWEL GREEN STERILE FF (TOWEL DISPOSABLE) ×1 IMPLANT
WATER STERILE IRR 1000ML POUR (IV SOLUTION) ×2 IMPLANT

## 2019-05-14 NOTE — Anesthesia Postprocedure Evaluation (Signed)
Anesthesia Post Note  Patient: Morgan Hancock  Procedure(s) Performed: ANTERIOR CERVICAL DECOMPRESSION/DISCECTOMY FUSION CERVICAL FIVE- CERVICAL SIX, CERVICAL SIX- CERVICAL SEVEN, CERVICAL SEVEN- THORACIC ONE (N/A Spine Cervical)     Patient location during evaluation: PACU Anesthesia Type: General Level of consciousness: awake and alert Pain management: pain level controlled Vital Signs Assessment: post-procedure vital signs reviewed and stable Respiratory status: spontaneous breathing, nonlabored ventilation and respiratory function stable Cardiovascular status: blood pressure returned to baseline and stable Postop Assessment: no apparent nausea or vomiting Anesthetic complications: no    Last Vitals:  Vitals:   05/14/19 1325 05/14/19 1357  BP: (!) 145/74 (!) 151/81  Pulse: 75 82  Resp: 15 18  Temp: (!) 36.3 C 36.8 C  SpO2: 100% 97%    Last Pain:  Vitals:   05/14/19 1400  TempSrc:   PainSc: [name]

## 2019-05-14 NOTE — Anesthesia Procedure Notes (Signed)
Procedure Name: Intubation Date/Time: 05/14/2019 8:53 AM Performed by: Larene Beach, CRNA Pre-anesthesia Checklist: Patient identified, Emergency Drugs available, Suction available and Patient being monitored Patient Re-evaluated:Patient Re-evaluated prior to induction Oxygen Delivery Method: Circle system utilized Preoxygenation: Pre-oxygenation with 100% oxygen Induction Type: IV induction Ventilation: Mask ventilation without difficulty Laryngoscope Size: Glidescope and 3 Grade View: Grade I Tube type: Oral Tube size: 7.0 mm Number of attempts: 1 Airway Equipment and Method: Oral airway,  Video-laryngoscopy and Bougie stylet Placement Confirmation: ETT inserted through vocal cords under direct vision,  positive ETCO2 and breath sounds checked- equal and bilateral Secured at: 22 cm Tube secured with: Tape Dental Injury: Teeth and Oropharynx as per pre-operative assessment

## 2019-05-14 NOTE — Transfer of Care (Signed)
Immediate Anesthesia Transfer of Care Note  Patient: Morgan Hancock  Procedure(s) Performed: ANTERIOR CERVICAL DECOMPRESSION/DISCECTOMY FUSION CERVICAL FIVE- CERVICAL SIX, CERVICAL SIX- CERVICAL SEVEN, CERVICAL SEVEN- THORACIC ONE (N/A Spine Cervical)  Patient Location: PACU  Anesthesia Type:General  Level of Consciousness: drowsy and patient cooperative  Airway & Oxygen Therapy: Patient Spontanous Breathing  Post-op Assessment: Report given to RN, Post -op Vital signs reviewed and stable and Patient moving all extremities X 4  Post vital signs: Reviewed and stable  Last Vitals:  Vitals Value Taken Time  BP 132/68 05/14/19 1225  Temp    Pulse 80 05/14/19 1226  Resp 18 05/14/19 1226  SpO2 97 % 05/14/19 1226  Vitals shown include unvalidated device data.  Last Pain:  Vitals:   05/14/19 0733  TempSrc:   PainSc: 0-No pain      Patients Stated Pain Goal: 3 ([phone] [phone])  Complications: No apparent anesthesia complications

## 2019-05-14 NOTE — Progress Notes (Signed)
Vitals:   05/14/19 1310 05/14/19 1325 05/14/19 1357 05/14/19 1641  BP: (!) 143/74 (!) 145/74 (!) 151/81 (!) 152/74  Pulse: 78 75 82 82  Resp: 16 15 18 16   Temp:  (!) 97.3 F (36.3 C) 98.2 F (36.8 C) 98.7 F (37.1 C)  TempSrc:   Oral Oral  SpO2: 97% 100% 97% 97%  Weight:      Height:        CBC Recent Labs    05/12/19 1349  WBC 6.1  HGB 12.9  HCT 41.0  PLT 279   BMET Recent Labs    05/12/19 1349  NA 142  K 4.1  CL 106  CO2 27  GLUCOSE 92  BUN 17  CREATININE 0.89  CALCIUM 10.6*    Patient is up and ambulating actively in the halls.  No pain or discomfort.  Minimal numbness in the tip of her right thumb.  Foley has been DC'd and she is voiding.  Dressing clean and dry.  Plan: Encouraged to continue to ambulate.  Continue to progress through postoperative recovery.  Hosie Spangle, MD 05/14/2019, 6:19 PM

## 2019-05-14 NOTE — H&P (Signed)
Subjective: Patient is a 74 y.o. female who is admitted for treatment of cervical spondylosis, degenerative disc disease, and disc herniation with resulting cervicalgia and right cervical radiculopathy.  Patient has had recurring difficulties over the past several years, worse over the past 6 months.  There is broad-based spondylitic disc herniation with bilateral osteophytic neuroforaminal encroachment at C5-6 and C6-7 and a large central right C7-T1 cervical disc herniation.  Patient admitted now for a 3 level C5-6, C6-7, and C7-T1 anterior cervical decompression and arthrodesis with structural allograft and cervical plating.  Patient Active Problem List   Diagnosis Date Noted  . Carotid arterial disease ([place]) 06/13/2018  . Rectal bleeding 06/13/2018  . Prediabetes 12/20/2017  . Hair thinning 12/18/2017  . Atherosclerosis of aorta ([place]) 04/29/2017  . Right lower quadrant abdominal pain 04/18/2017  . Fibrocystic breast disease 11/20/2015  . Osteopenia, high frax 10/29/2015  . HLD (hyperlipidemia) 10/28/2015  . Fatty liver 10/28/2015  . Vitamin D deficiency 10/28/2015  . Cataract 10/28/2015  . DD (diverticular disease) 06/18/2013  . Hx of adenomatous colonic polyps 06/18/2013  . Hypertension 05/27/2013   Past Medical History:  Diagnosis Date  . Cataract   . Colon polyps 06/18/2013  . Fatty liver   . Hx of adenomatous colonic polyps 06/18/2013   Overview:  Had colonoscopy 06/18/2013 added new dx of colon polyps and Diverticuiosis   . Hyperlipidemia   . Hypertension   . Osteopenia after menopause 2019    Past Surgical History:  Procedure Laterality Date  . ABDOMINAL HYSTERECTOMY     prolapse  . CHOLECYSTECTOMY    . COLONOSCOPY  06/18/2013  . GALLBLADDER SURGERY  [id number]  . TONSILLECTOMY  03/12/1952  . TUBAL LIGATION      Medications Prior to Admission  Medication Sig Dispense Refill Last Dose  . aspirin 81 MG chewable tablet Chew 81 mg by mouth at bedtime.   Past Week at  Unknown time  . atenolol (TENORMIN) 50 MG tablet Take 1 tablet (50 mg total) by mouth daily. (Patient taking differently: Take 50 mg by mouth at bedtime. ) 90 tablet 3 05/13/2019 at 1830  . BIOTIN PO Take 1 tablet by mouth 2 (two) times daily.    05/13/2019 at Unknown time  . CALCIUM-VITAMIN D PO Take 1 tablet by mouth 2 (two) times daily.    05/13/2019 at Unknown time  . fenofibrate (TRICOR) 145 MG tablet Take 145 mg by mouth daily.   05/13/2019 at Unknown time  . Omega-3 Fatty Acids (FISH OIL) 1200 MG CAPS Take 1,200 mg by mouth daily.   Past Week at Unknown time  . icosapent Ethyl (VASCEPA) 1 g capsule Take 2 capsules (2 g total) by mouth 2 (two) times daily. 180 capsule 1    Allergies  Allergen Reactions  . Lipitor [Atorvastatin]     Joint pain    Social History   Tobacco Use  . Smoking status: Never Smoker  . Smokeless tobacco: Never Used  Substance Use Topics  . Alcohol use: No    Family History  Problem Relation Age of Onset  . Hyperlipidemia Mother   . Heart disease Father   . Diabetes Father   . Parkinson's disease Father   . Hyperlipidemia Brother   . Hypertension Brother   . Miscarriages / Stillbirths Maternal Grandmother   . Miscarriages / Stillbirths Paternal Grandfather   . Colon cancer Neg Hx   . Stomach cancer Neg Hx   . Colon polyps Neg Hx   . Esophageal  cancer Neg Hx   . Rectal cancer Neg Hx      Review of Systems Pertinent items noted in HPI and remainder of comprehensive ROS otherwise negative.  Objective: Vital signs in last 24 hours: Temp:  [98.4 F (36.9 C)] 98.4 F (36.9 C) (01/06 0647) Pulse Rate:  [60] 60 (01/06 0647) Resp:  [17] 17 (01/06 0647) BP: (170)/(70) 170/70 (01/06 0647) SpO2:  [98 %] 98 % (01/06 0647) Weight:  [59.4 kg] 59.4 kg (01/06 0647)  EXAM: Patient is a well-developed well-nourished white female in no acute distress.   Lungs are clear to auscultation , the patient has symmetrical respiratory excursion. Heart has a regular rate and  rhythm normal S1 and S2 no murmur.   Abdomen is soft nontender nondistended bowel sounds are present. Extremity examination shows no clubbing cyanosis or edema. Motor examination shows 5 over 5 strength in the upper extremities including the deltoid biceps triceps and intrinsics and grip. Sensation is intact to pinprick throughout the digits of the upper extremities. Reflexes are symmetrical and without evidence of pathologic reflexes. Patient has a normal gait and stance.  Data Review:CBC    Component Value Date/Time   WBC 6.1 05/12/2019 1349   RBC 4.43 05/12/2019 1349   HGB 12.9 05/12/2019 1349   HCT 41.0 05/12/2019 1349   PLT 279 05/12/2019 1349   MCV 92.6 05/12/2019 1349   MCH 29.1 05/12/2019 1349   MCHC 31.5 05/12/2019 1349   RDW 14.1 05/12/2019 1349   LYMPHSABS 2.0 01/09/2019 0855   MONOABS 0.7 01/09/2019 0855   EOSABS 0.7 01/09/2019 0855   BASOSABS 0.1 01/09/2019 0855                          BMET    Component Value Date/Time   NA 142 05/12/2019 1349   NA 145 06/11/2015 0000   K 4.1 05/12/2019 1349   CL 106 05/12/2019 1349   CO2 27 05/12/2019 1349   GLUCOSE 92 05/12/2019 1349   BUN 17 05/12/2019 1349   BUN 20 06/11/2015 0000   CREATININE 0.89 05/12/2019 1349   CALCIUM 10.6 (H) 05/12/2019 1349   GFRNONAA >60 05/12/2019 1349   GFRAA >60 05/12/2019 1349     Assessment/Plan: Patient with recurring cervicalgia and right cervical radiculopathy with multilevel cervical degeneration and disc herniation who is admitted now for anterior cervical decompression and arthrodesis.  I've discussed with the patient the nature of his condition, the nature the surgical procedure, the typical length of surgery, hospital stay, and overall recuperation. We discussed limitations postoperatively. I discussed risks of surgery including risks of infection, bleeding, possibly need for transfusion, the risk of nerve root dysfunction with pain, weakness, numbness, or paresthesias, the risk of  spinal cord dysfunction with paralysis of all 4 limbs and quadriplegia, and the risk of dural tear and CSF leakage and possible need for further surgery, the risk of esophageal dysfunction causing dysphagia and the risk of laryngeal dysfunction causing hoarseness of the voice, the risk of failure of the arthrodesis and the possible need for further surgery, and the risk of anesthetic complications including myocardial infarction, stroke, pneumonia, and death. We also discussed the need for postoperative immobilization in a cervical collar. Understanding all this the patient does wish to proceed with surgery and is admitted for such.  Hosie Spangle, MD 05/14/2019 8:10 AM

## 2019-05-14 NOTE — Op Note (Signed)
05/14/2019  12:31 PM  PATIENT:  Morgan Hancock  74 y.o. female  PRE-OPERATIVE DIAGNOSIS: Multilevel cervical spondylosis, degenerative disc disease, and disc herniations with resulting cervicalgia and right cervical radiculopathy  POST-OPERATIVE DIAGNOSIS:  Multilevel cervical spondylosis, degenerative disc disease, and disc herniations with resulting cervicalgia and right cervical radiculopathy  PROCEDURE:  Procedure(s):  ANTERIOR CERVICAL DECOMPRESSION/DISCECTOMY AND ARTHRODESIS CERVICAL FIVE- CERVICAL SIX, CERVICAL SIX- CERVICAL SEVEN, CERVICAL SEVEN- THORACIC ONE with structural allograft and Medtronic Atlantis translational cervical plating  SURGEON: Jovita Gamma, MD  ASSISTANTS: Duffy Rhody, MD  ANESTHESIA:   general  EBL:  Total I/O In: 1400 [I.V.:1300; IV Piggyback:100] Out: 430 [Urine:400; Blood:30]  BLOOD ADMINISTERED:none  COUNT: Correct per nursing staff  DICTATION: Patient was brought to the operating room placed under general endotracheal anesthesia. Patient was placed in 10 pounds of halter traction. The neck was prepped with Betadine soap and solution and draped in a sterile fashion. A obliquel incision was made on the left side of the neck paralleling the anterior border of the sternocleidomastoid.. The line of the incision was infiltrated with local anesthetic with epinephrine. Dissection was carried down thru the subcutaneous tissue and platysma, bipolar cautery was used to maintain hemostasis. Dissection was then carried out thru an avascular plane leaving the sternocleidomastoid carotid artery and jugular vein laterally and the trachea and esophagus medially. The ventral aspect of the vertebral column was identified and a localizing x-ray was taken. The C5-6, C6-7, and C7-T1 levels were identified. The annulus at each level was incised and the disc space entered. Discectomy was performed with micro-curettes and pituitary rongeurs. The operating microscope was  draped and brought into the field provided additional magnification illumination and visualization. Discectomy was continued posteriorly thru the disc space and then the cartilaginous endplate was removed using micro-curettes along with the high-speed drill. Posterior osteophytic overgrowth was removed at each level using the high-speed drill along with a 2 mm thin footplated Kerrison punch. Posterior longitudinal ligament along with disc herniation was carefully removed, decompressing the spinal canal and thecal sac. We then continued to remove osteophytic overgrowth and disc material decompressing the neural foramina and exiting nerve roots bilaterally. Once the decompression was completed hemostasis was established at each level with the use of Gelfoam with thrombin and bipolar cautery. The Gelfoam was removed, the wound irrigated, a thin layer Surgifoam was applied, and hemostasis confirmed. We then measured the height of each intravertebral disc space level and selected a 6 millimeter in height structural allograft for each of the 3 levels. Each was hydrated in saline solution and then gently positioned in the intravertebral disc space and countersunk. We then selected a 55 mm millimeter in height Medtronic Atlantis translational cervical plate. It was positioned over the fusion construct and secured to each of the vertebra with a pair of screws. Each screw hole was started with the high-speed drill and then the screws placed.  4 x 14 mm screws were used at C6, C7, and T1.  At the C5 level, the screw holes were tapped because of bony sclerosis, and 4.5 x 13 mm screws were used.  Once all the screws were placed, the locking system was secured. The wound was irrigated with bacitracin solution checked for hemostasis which was established and confirmed. An x-ray was taken which showed the grafts at C5-6 and C6-7 were in good position, the shoulders obscured the C7-T1 level, all the screws looked in good position,  and the overall alignment of the construct looked good.  Under direct vision the graft at C7-T1 looked in good position.  We again checked for hemostasis which was confirmed.  We then proceeded with closure. The platysma was closed with interrupted inverted 2-0 undyed Vicryl suture, the subcutaneous and subcuticular closed with interrupted inverted 3-0 undyed Vicryl suture. The skin edges were approximated with Dermabond. Following surgery the patient was taken out of cervical traction.  The patient was reversed from the anesthetic and taken to the recovery room for further care.  PLAN OF CARE: Admit for overnight observation  PATIENT DISPOSITION:  PACU - hemodynamically stable.   Delay start of Pharmacological VTE agent (>24hrs) due to surgical blood loss or risk of bleeding:  yes

## 2019-05-15 ENCOUNTER — Encounter: Payer: Self-pay | Admitting: *Deleted

## 2019-05-15 ENCOUNTER — Telehealth: Payer: Self-pay | Admitting: *Deleted

## 2019-05-15 DIAGNOSIS — M4722 Other spondylosis with radiculopathy, cervical region: Secondary | ICD-10-CM | POA: Diagnosis not present

## 2019-05-15 NOTE — Discharge Summary (Signed)
Physician Discharge Summary  Patient ID: Morgan Hancock MRN: [id number] DOB/AGE: 11-23-1945 74 y.o.  Admit date: 05/14/2019 Discharge date: 05/15/2019  Admission Diagnoses:  Multilevel cervical spondylosis, degenerative disc disease, and disc herniations with resulting cervicalgia and right cervical radiculopathy  Discharge Diagnoses:  Multilevel cervical spondylosis, degenerative disc disease, and disc herniations with resulting cervicalgia and right cervical radiculopathy Active Problems:   HNP (herniated nucleus pulposus), cervical   Discharged Condition: good  Hospital Course: Patient was admitted, underwent a 3 level C5-6, C6-7, and C7-T1 ACDF.  She has done quite well following surgery.  She has not required any pain medication.  Her wound is healing nicely.  She is up and ambulating actively.  She is being discharged home with instructions regarding wound care and activities.  She is to follow-up with me in the office in 3 weeks.  Discharge Exam: Blood pressure 129/67, pulse 62, temperature 98.2 F (36.8 C), temperature source Oral, resp. rate 18, height 5\' 2"  (1.575 m), weight 59.4 kg, SpO2 97 %.  Disposition: Discharge disposition: 01-Home or Self Care       Discharge Instructions    Discharge wound care:   Complete by: As directed    Leave the wound open to air. Shower daily with the wound uncovered. Water and soapy water should run over the incision area. Do not wash directly on the incision for 2 weeks. Remove the glue after 2 weeks.   Driving Restrictions   Complete by: As directed    No driving for 2 weeks. May ride in the car locally now. May begin to drive locally in 2 weeks.   Other Restrictions   Complete by: As directed    Walk gradually increasing distances out in the fresh air at least twice a day. Walking additional 6 times inside the house, gradually increasing distances, daily. No bending, lifting, or twisting. Perform activities between shoulder and waist  height (that is at counter height when standing or table height when sitting).     Allergies as of 05/15/2019      Reactions   Lipitor [atorvastatin]    Joint pain      Medication List    TAKE these medications   aspirin 81 MG chewable tablet Chew 81 mg by mouth at bedtime.   atenolol 50 MG tablet Commonly known as: TENORMIN Take 1 tablet (50 mg total) by mouth daily. What changed: when to take this   BIOTIN PO Take 1 tablet by mouth 2 (two) times daily.   CALCIUM-VITAMIN D PO Take 1 tablet by mouth 2 (two) times daily.   fenofibrate 145 MG tablet Commonly known as: TRICOR Take 145 mg by mouth daily.   Fish Oil 1200 MG Caps Take 1,200 mg by mouth daily.   icosapent Ethyl 1 g capsule Commonly known as: Vascepa Take 2 capsules (2 g total) by mouth 2 (two) times daily.            Discharge Care Instructions  (From admission, onward)         Start     Ordered   05/15/19 0000  Discharge wound care:    Comments: Leave the wound open to air. Shower daily with the wound uncovered. Water and soapy water should run over the incision area. Do not wash directly on the incision for 2 weeks. Remove the glue after 2 weeks.   05/15/19 1136           Signed: Hosie Spangle 05/15/2019, 11:38 AM

## 2019-05-15 NOTE — Progress Notes (Signed)
Pt and husband given D/C instructions with verbal understanding. Rx was sent to pharmacy by MD. Pt's incision is clean and dry with no sign of infection. Pt's IV was removed prior to D/C. Pt D/C'd home via walking per MD order. Pt is stable @ D/C and has no other needs at this time. Holli Humbles, RN

## 2019-05-15 NOTE — Telephone Encounter (Signed)
Pt was on TCM report admitted 05/14/19 for Multilevel cervical spondylosis, degenerative disc disease, and disc herniations with resulting cervicalgia and right cervical radiculopathy. Pt underwent a 3 level C5-6, C6-7, and C7-T1 ACDF. She tolerated procedure well and D/C 05/15/19 she will f/u with Dr. Sherwood Gambler in 1-2 weeks.Marland KitchenJohny Chess

## 2019-05-15 NOTE — Discharge Instructions (Signed)
Wound Care Leave incision open to air. You may shower. Do not scrub directly on incision.  Do not put any creams, lotions, or ointments on incision. Activity Walk each and every day, increasing distance each day. No lifting greater than 5 lbs.  Avoid excessive neck motion. No driving for 2 weeks; may ride as a passenger locally. Wear neck brace at all times except when showering.  If provided soft collar, may wear for comfort unless otherwise instructed. Diet Resume your normal diet.  Return to Work Will be discussed at you follow up appointment. Call Your Doctor If Any of These Occur Redness, drainage, or swelling at the wound.  Temperature greater than 101 degrees. Severe pain not relieved by pain medication. Increased difficulty swallowing. Incision starts to come apart. Follow Up Appt Call today for appointment in 3 weeks [id number]) or for problems.  If you have any hardware placed in your spine, you will need an x-ray before your appointment.     Anterior Cervical Diskectomy and Fusion, Care After This sheet gives you information about how to care for yourself after your procedure. Your health care provider may also give you more specific instructions. If you have problems or questions, contact your health care provider. What can I expect after the procedure? After the procedure, it is common to have:  Neck pain.  Discomfort when swallowing.  Slight hoarseness. Follow these instructions at home: If you have a neck brace:  Wear it as told by your health care provider. Remove it only as told by your health care provider.  Keep the brace clean and dry.  Ask your health care provider if you should remove the brace to bathe or shower. Incision care   Follow instructions from your health care provider about how to take care of your incision. Make sure you: ? Wash your hands with soap and water before and after you change your bandage (dressing). If soap and water are not  available, use hand sanitizer. ? Change your dressing as told by your health care provider. ? Leave stitches (sutures), skin glue, or adhesive strips in place. These skin closures may need to stay in place for 2 weeks or longer. If adhesive strip edges start to loosen and curl up, you may trim the loose edges. Do not remove adhesive strips completely unless your health care provider tells you to do that.  Check your incision area every day for signs of infection. Check for: ? Redness, swelling, or pain. ? Fluid or blood. ? Warmth. ? Pus or a bad smell. Managing pain, stiffness, and swelling   Take over-the-counter and prescription medicines only as told by your health care provider.  If directed, put ice on the injured area. ? If you have a removable brace, remove it as told by your health care provider. ? Put ice in a plastic bag. ? Place a towel between your skin and the bag. ? Leave the ice on for 20 minutes, 2-3 times a day. Activity   Return to your normal activities as told by your health care provider. Ask your health care provider what activities are safe for you.  Do exercises as told by your health care provider.  Do not take baths, swim, or use a hot tub until your health care provider approves.  Do not lift anything that is heavier than 10 lb (4.5 kg), or the limit that you are told, until your health care provider says that it is safe. General instructions  Ask your  health care provider if the medicine prescribed to you: ? Requires you to avoid driving or using heavy machinery. ? Can cause constipation. You may need to take actions to prevent or treat constipation, such as:  Drink enough fluid to keep your urine pale yellow.  Take over-the-counter or prescription medicines.  Eat foods that are high in fiber, such as beans, whole grains, and fresh fruits and vegetables.  Limit foods that are high in fat and processed sugars, such as fried and sweet foods.  Do  not use any products that contain nicotine or tobacco, such as cigarettes, e-cigarettes, and chewing tobacco. These can delay healing. If you need help quitting, ask your health care provider.  Keep all follow-up visits and physical therapy appointments as told by your health care provider. This is important. Contact a health care provider if you have:  A fever.  Redness, swelling, or pain around your incision.  Fluid or blood coming from your incision.  Pus or a bad smell coming from your incision.  Pain that is not controlled by your pain medicine.  Increasing hoarseness or trouble swallowing. Get help right away if you have:  Severe pain.  Sudden numbness or weakness in your arms.  Warmth, tenderness, or swelling in your calf.  Chest pain.  Difficulty breathing. Summary  After the procedure, it is common to have neck pain, discomfort when swallowing, and slight hoarseness.  Follow instructions from your health care provider about how to take care of your incision.  Check your incision area every day for signs of infection.  Return to your normal activities as told by your health care provider. Ask your health care provider what activities are safe for you.  Contact a health care provider if you have signs of infection at your incision. This information is not intended to replace advice given to you by your health care provider. Make sure you discuss any questions you have with your health care provider. Document Revised: 01/17/2018 Document Reviewed: 01/17/2018 Elsevier Patient Education  2020 [employer].

## 2019-05-16 LAB — TYPE AND SCREEN
ABO/RH(D): A NEG
Antibody Screen: POSITIVE
Donor AG Type: NEGATIVE
Donor AG Type: NEGATIVE
PT AG Type: NEGATIVE
Unit division: 0
Unit division: 0

## 2019-05-16 LAB — BPAM RBC
Blood Product Expiration Date: 202101152359
Blood Product Expiration Date: 202101152359
Unit Type and Rh: 600
Unit Type and Rh: 600

## 2019-06-04 DIAGNOSIS — Z981 Arthrodesis status: Secondary | ICD-10-CM

## 2019-06-04 HISTORY — DX: Arthrodesis status: Z98.1

## 2019-07-11 ENCOUNTER — Ambulatory Visit: Payer: Medicare Other

## 2019-08-19 ENCOUNTER — Ambulatory Visit (INDEPENDENT_AMBULATORY_CARE_PROVIDER_SITE_OTHER): Payer: Medicare Other

## 2019-08-19 ENCOUNTER — Other Ambulatory Visit: Payer: Self-pay

## 2019-08-19 VITALS — BP 128/80 | HR 62 | Temp 98.2°F | Resp 16 | Ht 62.0 in | Wt 134.6 lb

## 2019-08-19 DIAGNOSIS — Z0000 Encounter for general adult medical examination without abnormal findings: Secondary | ICD-10-CM | POA: Diagnosis not present

## 2019-08-19 NOTE — Patient Instructions (Signed)
Morgan Hancock , Thank you for taking time to come for your Medicare Wellness Visit. I appreciate your ongoing commitment to your health goals. Please review the following plan we discussed and let me know if I can assist you in the future.   Screening recommendations/referrals: Colorectal Screening: up to date 07/18/2018 Mammogram: up to date 02/06/2019 Bone Density: up to date 01/17/2018  Vision and Dental Exams: Recommended annual ophthalmology exams for early detection of glaucoma and other disorders of the eye Recommended annual dental exams for proper oral hygiene  Vaccinations: Influenza vaccine: decline Pneumococcal vaccine: up to date 05/27/2013 & 08/31/2014 Tdap vaccine: 05/31/2011; due every 10 years Shingles vaccine: Please call your insurance company to determine your out of pocket expense for the Shingrix vaccine. You may receive this vaccine at your local pharmacy.  Advanced directives: Advance directives discussed with you today. Please bring a copy of your POA (Power of Boody) and/or Living Will to your next appointment.  Goals:  Recommend to drink at least 6-8 8oz glasses of water per day.  Recommend to exercise for at least 150 minutes per week.  Recommend to remove any items from the home that may cause slips or trips.  Recommend to decrease portion sizes by eating 3 small healthy meals and at least 2 healthy snacks per day.  Recommend to begin DASH diet as directed below  Recommend to continue efforts to reduce smoking habits until no longer smoking. Smoking Cessation literature is attached below.  Next appointment: Please schedule your Annual Wellness Visit with your Nurse Health Advisor in one year.  Preventive Care 60 Years and Older, Female Preventive care refers to lifestyle choices and visits with your health care provider that can promote health and wellness. What does preventive care include?  A yearly physical exam. This is also called an annual well  check.  Dental exams once or twice a year.  Routine eye exams. Ask your health care provider how often you should have your eyes checked.  Personal lifestyle choices, including:  Daily care of your teeth and gums.  Regular physical activity.  Eating a healthy diet.  Avoiding tobacco and drug use.  Limiting alcohol use.  Practicing safe sex.  Taking low-dose aspirin every day if recommended by your health care provider.  Taking vitamin and mineral supplements as recommended by your health care provider. What happens during an annual well check? The services and screenings done by your health care provider during your annual well check will depend on your age, overall health, lifestyle risk factors, and family history of disease. Counseling  Your health care provider may ask you questions about your:  Alcohol use.  Tobacco use.  Drug use.  Emotional well-being.  Home and relationship well-being.  Sexual activity.  Eating habits.  History of falls.  Memory and ability to understand (cognition).  Work and work Statistician.  Reproductive health. Screening  You may have the following tests or measurements:  Height, weight, and BMI.  Blood pressure.  Lipid and cholesterol levels. These may be checked every 5 years, or more frequently if you are over 37 years old.  Skin check.  Lung cancer screening. You may have this screening every year starting at age 110 if you have a 30-pack-year history of smoking and currently smoke or have quit within the past 15 years.  Fecal occult blood test (FOBT) of the stool. You may have this test every year starting at age 54.  Flexible sigmoidoscopy or colonoscopy. You may have  a sigmoidoscopy every 5 years or a colonoscopy every 10 years starting at age 38.  Hepatitis C blood test.  Hepatitis B blood test.  Sexually transmitted disease (STD) testing.  Diabetes screening. This is done by checking your blood sugar  (glucose) after you have not eaten for a while (fasting). You may have this done every 1-3 years.  Bone density scan. This is done to screen for osteoporosis. You may have this done starting at age 73.  Mammogram. This may be done every 1-2 years. Talk to your health care provider about how often you should have regular mammograms. Talk with your health care provider about your test results, treatment options, and if necessary, the need for more tests. Vaccines  Your health care provider may recommend certain vaccines, such as:  Influenza vaccine. This is recommended every year.  Tetanus, diphtheria, and acellular pertussis (Tdap, Td) vaccine. You may need a Td booster every 10 years.  Zoster vaccine. You may need this after age 20.  Pneumococcal 13-valent conjugate (PCV13) vaccine. One dose is recommended after age 59.  Pneumococcal polysaccharide (PPSV23) vaccine. One dose is recommended after age 41. Talk to your health care provider about which screenings and vaccines you need and how often you need them. This information is not intended to replace advice given to you by your health care provider. Make sure you discuss any questions you have with your health care provider. Document Released: 05/21/2015 Document Revised: 01/12/2016 Document Reviewed: 02/23/2015 Elsevier Interactive Patient Education  2017 [place] Prevention in the Home Falls can cause injuries. They can happen to people of all ages. There are many things you can do to make your home safe and to help prevent falls. What can I do on the outside of my home?  Regularly fix the edges of walkways and driveways and fix any cracks.  Remove anything that might make you trip as you walk through a door, such as a raised step or threshold.  Trim any bushes or trees on the path to your home.  Use bright outdoor lighting.  Clear any walking paths of anything that might make someone trip, such as rocks or  tools.  Regularly check to see if handrails are loose or broken. Make sure that both sides of any steps have handrails.  Any raised decks and porches should have guardrails on the edges.  Have any leaves, snow, or ice cleared regularly.  Use sand or salt on walking paths during winter.  Clean up any spills in your garage right away. This includes oil or grease spills. What can I do in the bathroom?  Use night lights.  Install grab bars by the toilet and in the tub and shower. Do not use towel bars as grab bars.  Use non-skid mats or decals in the tub or shower.  If you need to sit down in the shower, use a plastic, non-slip stool.  Keep the floor dry. Clean up any water that spills on the floor as soon as it happens.  Remove soap buildup in the tub or shower regularly.  Attach bath mats securely with double-sided non-slip rug tape.  Do not have throw rugs and other things on the floor that can make you trip. What can I do in the bedroom?  Use night lights.  Make sure that you have a light by your bed that is easy to reach.  Do not use any sheets or blankets that are too big for your bed.  They should not hang down onto the floor.  Have a firm chair that has side arms. You can use this for support while you get dressed.  Do not have throw rugs and other things on the floor that can make you trip. What can I do in the kitchen?  Clean up any spills right away.  Avoid walking on wet floors.  Keep items that you use a lot in easy-to-reach places.  If you need to reach something above you, use a strong step stool that has a grab bar.  Keep electrical cords out of the way.  Do not use floor polish or wax that makes floors slippery. If you must use wax, use non-skid floor wax.  Do not have throw rugs and other things on the floor that can make you trip. What can I do with my stairs?  Do not leave any items on the stairs.  Make sure that there are handrails on both  sides of the stairs and use them. Fix handrails that are broken or loose. Make sure that handrails are as long as the stairways.  Check any carpeting to make sure that it is firmly attached to the stairs. Fix any carpet that is loose or worn.  Avoid having throw rugs at the top or bottom of the stairs. If you do have throw rugs, attach them to the floor with carpet tape.  Make sure that you have a light switch at the top of the stairs and the bottom of the stairs. If you do not have them, ask someone to add them for you. What else can I do to help prevent falls?  Wear shoes that:  Do not have high heels.  Have rubber bottoms.  Are comfortable and fit you well.  Are closed at the toe. Do not wear sandals.  If you use a stepladder:  Make sure that it is fully opened. Do not climb a closed stepladder.  Make sure that both sides of the stepladder are locked into place.  Ask someone to hold it for you, if possible.  Clearly mark and make sure that you can see:  Any grab bars or handrails.  First and last steps.  Where the edge of each step is.  Use tools that help you move around (mobility aids) if they are needed. These include:  Canes.  Walkers.  Scooters.  Crutches.  Turn on the lights when you go into a dark area. Replace any light bulbs as soon as they burn out.  Set up your furniture so you have a clear path. Avoid moving your furniture around.  If any of your floors are uneven, fix them.  If there are any pets around you, be aware of where they are.  Review your medicines with your doctor. Some medicines can make you feel dizzy. This can increase your chance of falling. Ask your doctor what other things that you can do to help prevent falls. This information is not intended to replace advice given to you by your health care provider. Make sure you discuss any questions you have with your health care provider. Document Released: 02/18/2009 Document Revised:  09/30/2015 Document Reviewed: 05/29/2014 Elsevier Interactive Patient Education  2017 [employer].

## 2019-08-19 NOTE — Progress Notes (Signed)
Subjective:   Morgan Hancock is a 74 y.o. female who presents for Medicare Annual (Subsequent) preventive examination.  Review of Systems:  Medicare Wellness Visit Cardiac Risk Factors include: advanced age (>13men, >32 women);dyslipidemia  Sleep Patterns: No issues with falling sleep;feels rested on waking after 7 hours of sleep nightly. Home Safety/Smoke Alarms: Feels safe in home; Smoke alarms in place. Living environment: 1-story home; lives with her husband. Seat Belt Safety/Bike Helmet: Wears seat belt.     Objective:     Vitals: BP 128/80 (BP Location: Right Arm, Patient Position: Sitting, Cuff Size: Normal)   Pulse 62   Temp 98.2 F (36.8 C)   Resp 16   Ht 5\' 2"  (1.575 m)   Wt 134 lb 9.6 oz (61.1 kg)   SpO2 99%   BMI 24.62 kg/m   Body mass index is 24.62 kg/m.  Advanced Directives 05/12/2019 09/19/2017  Does Patient Have a Medical Advance Directive? Yes Yes  Type of Paramedic of [place];Living will [place];Living will  Does patient want to make changes to medical advance directive? No - Patient declined -  Copy of [place] in Chart? - No - copy requested    Tobacco Social History   Tobacco Use  Smoking Status Never Smoker  Smokeless Tobacco Never Used     Counseling given: No   Clinical Intake:  Pre-visit preparation completed: Yes  Pain : No/denies pain Pain Score: 0-No pain     BMI - recorded: 24.6 Nutritional Status: BMI of 19-24  Normal Nutritional Risks: None Diabetes: No  How often do you need to have someone help you when you read instructions, pamphlets, or other written materials from your doctor or pharmacy?: 1 - Never What is the last grade level you completed in school?: 4 year degree  Interpreter Needed?: No  Information entered by :: Marty Uy N. Lowell Guitar, LPN  Past Medical History:  Diagnosis Date  . Cataract   . Colon polyps 06/18/2013  . Fatty liver   . Hx  of adenomatous colonic polyps 06/18/2013   Overview:  Had colonoscopy 06/18/2013 added new dx of colon polyps and Diverticuiosis   . Hyperlipidemia   . Hypertension   . Osteopenia after menopause 2019   Past Surgical History:  Procedure Laterality Date  . ABDOMINAL HYSTERECTOMY     prolapse  . ANTERIOR CERVICAL DECOMP/DISCECTOMY FUSION N/A 05/14/2019   Procedure: ANTERIOR CERVICAL DECOMPRESSION/DISCECTOMY FUSION CERVICAL FIVE- CERVICAL SIX, CERVICAL SIX- CERVICAL SEVEN, CERVICAL SEVEN- THORACIC ONE;  Surgeon: Jovita Gamma, MD;  Location: [facility];  Service: Neurosurgery;  Laterality: N/A;  ANTERIOR CERVICAL DECOMPRESSION/DISCECTOMY FUSION CERVICAL 5- CERVICAL 6, CERVICAL 6- CERVICAL 7, CERVICAL 7- THORACIC 1  . CHOLECYSTECTOMY    . COLONOSCOPY  06/18/2013  . GALLBLADDER SURGERY  [id number]  . TONSILLECTOMY  03/12/1952  . TUBAL LIGATION     Family History  Problem Relation Age of Onset  . Hyperlipidemia Mother   . Heart disease Father   . Diabetes Father   . Parkinson's disease Father   . Hyperlipidemia Brother   . Hypertension Brother   . Miscarriages / Stillbirths Maternal Grandmother   . Miscarriages / Stillbirths Paternal Grandfather   . Colon cancer Neg Hx   . Stomach cancer Neg Hx   . Colon polyps Neg Hx   . Esophageal cancer Neg Hx   . Rectal cancer Neg Hx    Social History   Socioeconomic History  . Marital status: Married  Spouse name: Not on file  . Number of children: Not on file  . Years of education: Not on file  . Highest education level: Not on file  Occupational History  . Not on file  Tobacco Use  . Smoking status: Never Smoker  . Smokeless tobacco: Never Used  Substance and Sexual Activity  . Alcohol use: No  . Drug use: No  . Sexual activity: Not Currently  Other Topics Concern  . Not on file  Social History Narrative   Married   Exercise: gardening, mows lawn, [facility] in [facility]- [facility]   No alcohol tobacco or drug use   Social Determinants of  Radio broadcast assistant Strain:   . Difficulty of Paying Living Expenses:   Food Insecurity:   . Worried About Charity fundraiser in the Last Year:   . Arboriculturist in the Last Year:   Transportation Needs:   . Film/video editor (Medical):   Marland Kitchen Lack of Transportation (Non-Medical):   Physical Activity:   . Days of Exercise per Week:   . Minutes of Exercise per Session:   Stress:   . Feeling of Stress :   Social Connections:   . Frequency of Communication with Friends and Family:   . Frequency of Social Gatherings with Friends and Family:   . Attends Religious Services:   . Active Member of Clubs or Organizations:   . Attends Archivist Meetings:   Marland Kitchen Marital Status:     Outpatient Encounter Medications as of 08/19/2019  Medication Sig  . aspirin 81 MG chewable tablet Chew 81 mg by mouth at bedtime.  Marland Kitchen atenolol (TENORMIN) 50 MG tablet Take 1 tablet (50 mg total) by mouth daily. (Patient taking differently: Take 50 mg by mouth at bedtime. )  . BIOTIN PO Take 1 tablet by mouth 2 (two) times daily.   Marland Kitchen CALCIUM-VITAMIN D PO Take 1 tablet by mouth 2 (two) times daily.   . fenofibrate (TRICOR) 145 MG tablet Take 145 mg by mouth daily.  Marland Kitchen icosapent Ethyl (VASCEPA) 1 g capsule Take 2 capsules (2 g total) by mouth 2 (two) times daily.  . Omega-3 Fatty Acids (FISH OIL) 1200 MG CAPS Take 1,200 mg by mouth daily.   No facility-administered encounter medications on file as of 08/19/2019.    Activities of Daily Living In your present state of health, do you have any difficulty performing the following activities: 08/19/2019 05/12/2019  Hearing? N -  Vision? N -  Difficulty concentrating or making decisions? N -  Walking or climbing stairs? N -  Dressing or bathing? N -  Doing errands, shopping? N N  Preparing Food and eating ? N -  Using the Toilet? N -  In the past six months, have you accidently leaked urine? N -  Do you have problems with loss of bowel control? N -   Managing your Medications? N -  Managing your Finances? N -  Housekeeping or managing your Housekeeping? N -  Some recent data might be hidden    Patient Care Team: Binnie Rail, MD as PCP - General (Internal Medicine)    Assessment:   This is a routine wellness examination for [place].  Exercise Activities and Dietary recommendations Current Exercise Habits: Home exercise routine, Type of exercise: walking;Other - see comments(working in yard), Time (Minutes): 20, Frequency (Times/Week): 7, Weekly Exercise (Minutes/Week): 140, Intensity: Moderate, Exercise limited by: None identified  Goals    .  Patient Stated     I want my husband and I to take a train trip and take some RV trips. Enjoy, family and friends.    . Patient Stated     To lose 5 pounds and continue to be active.       Fall Risk Fall Risk  08/19/2019 06/13/2018 09/19/2017 10/28/2015  Falls in the past year? 1 0 No No  Number falls in past yr: 0 - - -  Injury with Fall? 0 - - -  Risk for fall due to : No Fall Risks - - -  Risk for fall due to: Comment tripped over a shoe - - -  Follow up Falls evaluation completed;Education provided - - -   Is the patient's home free of loose throw rugs in walkways, pet beds, electrical cords, etc?   yes      Grab bars in the bathroom? no      Handrails on the stairs?   yes      Adequate lighting?   yes  Depression Screen PHQ 2/9 Scores 08/19/2019 06/13/2018 09/19/2017 10/28/2015  PHQ - 2 Score 0 0 0 0  PHQ- 9 Score - - 0 -     Cognitive Function MMSE - Mini Mental State Exam 09/19/2017  Orientation to time 5  Orientation to Place 5  Registration 3  Attention/ Calculation 5  Recall 3  Language- name 2 objects 2  Language- repeat 1  Language- follow 3 step command 3  Language- read & follow direction 1  Write a sentence 1  Copy design 1  Total score 30     6CIT Screen 08/19/2019  What Year? 0 points  What month? 0 points  What time? 0 points  Count back from 20 0  points  Months in reverse 0 points  Repeat phrase 0 points  Total Score 0    Immunization History  Administered Date(s) Administered  . PPD Test 06/08/2014, 06/10/2015  . Pneumococcal Conjugate-13 08/31/2014  . Pneumococcal Polysaccharide-23 05/26/2005, 05/27/2013  . Tdap 05/31/2011    Qualifies for Shingles Vaccine? declined  Screening Tests Health Maintenance  Topic Date Due  . INFLUENZA VACCINE  12/07/2019  . DEXA SCAN  01/18/2020  . MAMMOGRAM  02/05/2021  . TETANUS/TDAP  05/30/2021  . COLONOSCOPY  07/17/2021  . Hepatitis C Screening  Completed  . PNA vac Low Risk Adult  Completed    Cancer Screenings: Lung: Low Dose CT Chest recommended if Age 75-80 years, 30 pack-year currently smoking OR have quit w/in 15years. Patient does not qualify. Breast:  Up to date on Mammogram? Yes   Up to date of Bone Density/Dexa? Yes Colorectal: Yes     Plan:     Reviewed health maintenance screenings with patient today and relevant education, vaccines, and/or referrals were provided.    Continue doing brain stimulating activities (puzzles, reading, adult coloring books, staying active) to keep memory sharp.    Continue to eat heart healthy diet (full of fruits, vegetables, whole grains, lean protein, water--limit salt, fat, and sugar intake) and increase physical activity as tolerated.  I have personally reviewed and noted the following in the patient's chart:   . Medical and social history . Use of alcohol, tobacco or illicit drugs  . Current medications and supplements . Functional ability and status . Nutritional status . Physical activity . Advanced directives . List of other physicians . Hospitalizations, surgeries, and ER visits in previous 12 months . Vitals . Screenings to include cognitive,  depression, and falls . Referrals and appointments  In addition, I have reviewed and discussed with patient certain preventive protocols, quality metrics, and best practice  recommendations. A written personalized care plan for preventive services as well as general preventive health recommendations were provided to patient.     Sheral Flow, LPN  [id number] Nurse Health Advisor, Embedded Care Coordination [place] [facility]  Site: [facility] [place]: [phone] Website: [email or web address]

## 2019-09-22 ENCOUNTER — Encounter: Payer: Self-pay | Admitting: Internal Medicine

## 2019-09-23 NOTE — Progress Notes (Signed)
Subjective:    Patient ID: Morgan Hancock, female    DOB: Jan 22, 1946, 74 y.o.   MRN: [id number]  HPI The patient is here for an acute visit.   Ear pain:  3 nights ago she had a severe stabbing left sided ear pain and it would radiate down to her neck.  She also had a headache later on that night on the left side.  At one point the left side of her head was tender, but not now.  Now she is nauseous, dizzy, dec appetite, weak.    Rash on left side of her chin last night - she had been mowing and thinks it is poinson ivy.  She put cortisone cream.  It itched a little. She denies pain.    She has had her covid vaccines.    Medications and allergies reviewed with patient and updated if appropriate.  Patient Active Problem List   Diagnosis Date Noted  . HNP (herniated nucleus pulposus), cervical 05/14/2019  . Carotid arterial disease ([place]) 06/13/2018  . Rectal bleeding 06/13/2018  . Prediabetes 12/20/2017  . Hair thinning 12/18/2017  . Atherosclerosis of aorta ([place]) 04/29/2017  . Right lower quadrant abdominal pain 04/18/2017  . Fibrocystic breast disease 11/20/2015  . Osteopenia, high frax 10/29/2015  . HLD (hyperlipidemia) 10/28/2015  . Fatty liver 10/28/2015  . Vitamin D deficiency 10/28/2015  . Cataract 10/28/2015  . DD (diverticular disease) 06/18/2013  . Hx of adenomatous colonic polyps 06/18/2013  . Hypertension 05/27/2013    Current Outpatient Medications on File Prior to Visit  Medication Sig Dispense Refill  . aspirin 81 MG chewable tablet Chew 81 mg by mouth at bedtime.    Marland Kitchen atenolol (TENORMIN) 50 MG tablet Take 1 tablet (50 mg total) by mouth daily. (Patient taking differently: Take 50 mg by mouth at bedtime. ) 90 tablet 3  . BIOTIN PO Take 1 tablet by mouth 2 (two) times daily.     Marland Kitchen CALCIUM-VITAMIN D PO Take 1 tablet by mouth 2 (two) times daily.     . fenofibrate (TRICOR) 145 MG tablet Take 145 mg by mouth daily.     No current facility-administered  medications on file prior to visit.    Past Medical History:  Diagnosis Date  . Cataract   . Colon polyps 06/18/2013  . Fatty liver   . Hx of adenomatous colonic polyps 06/18/2013   Overview:  Had colonoscopy 06/18/2013 added new dx of colon polyps and Diverticuiosis   . Hyperlipidemia   . Hypertension   . Osteopenia after menopause 2019    Past Surgical History:  Procedure Laterality Date  . ABDOMINAL HYSTERECTOMY     prolapse  . ANTERIOR CERVICAL DECOMP/DISCECTOMY FUSION N/A 05/14/2019   Procedure: ANTERIOR CERVICAL DECOMPRESSION/DISCECTOMY FUSION CERVICAL FIVE- CERVICAL SIX, CERVICAL SIX- CERVICAL SEVEN, CERVICAL SEVEN- THORACIC ONE;  Surgeon: Jovita Gamma, MD;  Location: [facility];  Service: Neurosurgery;  Laterality: N/A;  ANTERIOR CERVICAL DECOMPRESSION/DISCECTOMY FUSION CERVICAL 5- CERVICAL 6, CERVICAL 6- CERVICAL 7, CERVICAL 7- THORACIC 1  . CHOLECYSTECTOMY    . COLONOSCOPY  06/18/2013  . GALLBLADDER SURGERY  [id number]  . TONSILLECTOMY  03/12/1952  . TUBAL LIGATION      Social History   Socioeconomic History  . Marital status: Married    Spouse name: Not on file  . Number of children: Not on file  . Years of education: Not on file  . Highest education level: Not on file  Occupational History  . Not on file  Tobacco Use  . Smoking status: Never Smoker  . Smokeless tobacco: Never Used  Substance and Sexual Activity  . Alcohol use: No  . Drug use: No  . Sexual activity: Not Currently  Other Topics Concern  . Not on file  Social History Narrative   Married   Exercise: gardening, mows lawn, goes to [employer] in fall- winter   No alcohol tobacco or drug use   Social Determinants of Radio broadcast assistant Strain:   . Difficulty of Paying Living Expenses:   Food Insecurity:   . Worried About Charity fundraiser in the Last Year:   . Arboriculturist in the Last Year:   Transportation Needs:   . Film/video editor (Medical):   Marland Kitchen Lack of Transportation  (Non-Medical):   Physical Activity:   . Days of Exercise per Week:   . Minutes of Exercise per Session:   Stress:   . Feeling of Stress :   Social Connections:   . Frequency of Communication with Friends and Family:   . Frequency of Social Gatherings with Friends and Family:   . Attends Religious Services:   . Active Member of Clubs or Organizations:   . Attends Archivist Meetings:   Marland Kitchen Marital Status:     Family History  Problem Relation Age of Onset  . Hyperlipidemia Mother   . Heart disease Father   . Diabetes Father   . Parkinson's disease Father   . Hyperlipidemia Brother   . Hypertension Brother   . Miscarriages / Stillbirths Maternal Grandmother   . Miscarriages / Stillbirths Paternal Grandfather   . Colon cancer Neg Hx   . Stomach cancer Neg Hx   . Colon polyps Neg Hx   . Esophageal cancer Neg Hx   . Rectal cancer Neg Hx     Review of Systems  Constitutional: Positive for chills and fever (low grade 99.2).  HENT: Positive for ear pain and tinnitus. Negative for ear discharge and hearing loss.   Eyes: Negative for visual disturbance.  Skin: Positive for rash.  Neurological: Positive for dizziness, light-headedness and headaches (left sided). Negative for weakness and numbness (left side of tongue feels funny).       Objective:   Vitals:   09/24/19 1046  BP: (!) 152/80  Pulse: 70  Resp: 16  Temp: 98.3 F (36.8 C)  SpO2: 98%   BP Readings from Last 3 Encounters:  09/24/19 (!) 152/80  08/19/19 128/80  05/15/19 129/67   Wt Readings from Last 3 Encounters:  09/24/19 132 lb 6.4 oz (60.1 kg)  08/19/19 134 lb 9.6 oz (61.1 kg)  05/14/19 131 lb (59.4 kg)   Body mass index is 24.22 kg/m.   Physical Exam    GENERAL APPEARANCE: Appears stated age, well appearing, NAD EYES: conjunctiva clear, no icterus HEENT: R ear canal and TM normal.  L ear canal slightly swollen with mild erythema, TM partially visualized due to wax but appears normal,  oropharynx with no erythema, no thyromegaly, trachea midline, no cervical or supraclavicular lymphadenopathy LUNGS: Clear to auscultation without wheeze or crackles, unlabored breathing, good air entry bilaterally CARDIOVASCULAR: Normal S1,S2 without murmurs, no edema Neuro; CN 2-12 intact, no facial droop SKIN: Warm, dry, macular papular/blister rash on left side of chin and left lower lip, a couple of lesion anterior to left ear       Assessment & Plan:    See Problem List for Assessment and Plan of chronic medical  problems.    This visit occurred during the SARS-CoV-2 public health emergency.  Safety protocols were in place, including screening questions prior to the visit, additional usage of staff PPE, and extensive cleaning of exam room while observing appropriate contact time as indicated for disinfecting solutions.

## 2019-09-24 ENCOUNTER — Ambulatory Visit (INDEPENDENT_AMBULATORY_CARE_PROVIDER_SITE_OTHER): Payer: Medicare Other | Admitting: Internal Medicine

## 2019-09-24 ENCOUNTER — Other Ambulatory Visit: Payer: Self-pay

## 2019-09-24 ENCOUNTER — Encounter: Payer: Self-pay | Admitting: Internal Medicine

## 2019-09-24 VITALS — BP 152/80 | HR 70 | Temp 98.3°F | Resp 16 | Ht 62.0 in | Wt 132.4 lb

## 2019-09-24 DIAGNOSIS — R110 Nausea: Secondary | ICD-10-CM | POA: Diagnosis not present

## 2019-09-24 DIAGNOSIS — B029 Zoster without complications: Secondary | ICD-10-CM

## 2019-09-24 HISTORY — DX: Zoster without complications: B02.9

## 2019-09-24 MED ORDER — ONDANSETRON 4 MG PO TBDP: 4.0000 mg | ORAL_TABLET | Freq: Three times a day (TID) | ORAL | 0 refills | Status: DC | PRN

## 2019-09-24 MED ORDER — GABAPENTIN 100 MG PO CAPS: 100.0000 mg | ORAL_CAPSULE | Freq: Three times a day (TID) | ORAL | 3 refills | Status: DC | PRN

## 2019-09-24 MED ORDER — VALACYCLOVIR HCL 1 G PO TABS: 1000.0000 mg | ORAL_TABLET | Freq: Three times a day (TID) | ORAL | 0 refills | Status: DC

## 2019-09-24 NOTE — Patient Instructions (Signed)
Medications reviewed and updated.  Changes include :   vatrex ( anti-viral), zofran ( anti-nausea medication) and gabapentin ( nerve pain medication)   Your prescription(s) have been submitted to your pharmacy. Please take as directed and contact our office if you believe you are having problem(s) with the medication(s).  Please call if there is no improvement in your symptoms.     Shingles  Shingles, which is also known as herpes zoster, is an infection that causes a painful skin rash and fluid-filled blisters. It is caused by a virus. Shingles only develops in people who:  Have had chickenpox.  Have been given a medicine to protect against chickenpox (have been vaccinated). Shingles is rare in this group. What are the causes? Shingles is caused by varicella-zoster virus (VZV). This is the same virus that causes chickenpox. After a person is exposed to VZV, the virus stays in the body in an inactive (dormant) state. Shingles develops if the virus is reactivated. This can happen many years after the first (initial) exposure to VZV. It is not known what causes this virus to be reactivated. What increases the risk? People who have had chickenpox or received the chickenpox vaccine are at risk for shingles. Shingles infection is more common in people who:  Are older than age 72.  Have a weakened disease-fighting system (immune system), such as people with: ? HIV. ? AIDS. ? Cancer.  Are taking medicines that weaken the immune system, such as transplant medicines.  Are experiencing a lot of stress. What are the signs or symptoms? Early symptoms of this condition include itching, tingling, and pain in an area on your skin. Pain may be described as burning, stabbing, or throbbing. A few days or weeks after early symptoms start, a painful red rash appears. The rash is usually on one side of the body and has a band-like or belt-like pattern. The rash eventually turns into fluid-filled  blisters that break open, change into scabs, and dry up in about 2-3 weeks. At any time during the infection, you may also develop:  A fever.  Chills.  A headache.  An upset stomach. How is this diagnosed? This condition is diagnosed with a skin exam. Skin or fluid samples may be taken from the blisters before a diagnosis is made. These samples are examined under a microscope or sent to a lab for testing. How is this treated? The rash may last for several weeks. There is not a specific cure for this condition. Your health care provider will probably prescribe medicines to help you manage pain, recover more quickly, and avoid long-term problems. Medicines may include:  Antiviral drugs.  Anti-inflammatory drugs.  Pain medicines.  Anti-itching medicines (antihistamines). If the area involved is on your face, you may be referred to a specialist, such as an eye doctor (ophthalmologist) or an ear, nose, and throat (ENT) doctor (otolaryngologist) to help you avoid eye problems, chronic pain, or disability. Follow these instructions at home: Medicines  Take over-the-counter and prescription medicines only as told by your health care provider.  Apply an anti-itch cream or numbing cream to the affected area as told by your health care provider. Relieving itching and discomfort   Apply cold, wet cloths (cold compresses) to the area of the rash or blisters as told by your health care provider.  Cool baths can be soothing. Try adding baking soda or dry oatmeal to the water to reduce itching. Do not bathe in hot water. Blister and rash care  Keep  your rash covered with a loose bandage (dressing). Wear loose-fitting clothing to help ease the pain of material rubbing against the rash.  Keep your rash and blisters clean by washing the area with mild soap and cool water as told by your health care provider.  Check your rash every day for signs of infection. Check for: ? More redness,  swelling, or pain. ? Fluid or blood. ? Warmth. ? Pus or a bad smell.  Do not scratch your rash or pick at your blisters. To help avoid scratching: ? Keep your fingernails clean and cut short. ? Wear gloves or mittens while you sleep, if scratching is a problem. General instructions  Rest as told by your health care provider.  Keep all follow-up visits as told by your health care provider. This is important.  Wash your hands often with soap and water. If soap and water are not available, use hand sanitizer. Doing this lowers your chance of getting a bacterial skin infection.  Before your blisters change into scabs, your shingles infection can cause chickenpox in people who have never had it or have never been vaccinated against it. To prevent this from happening, avoid contact with other people, especially: ? Babies. ? Pregnant women. ? Children who have eczema. ? Elderly people who have transplants. ? People who have chronic illnesses, such as cancer or AIDS. Contact a health care provider if:  Your pain is not relieved with prescribed medicines.  Your pain does not get better after the rash heals.  You have signs of infection in the rash area, such as: ? More redness, swelling, or pain around the rash. ? Fluid or blood coming from the rash. ? The rash area feeling warm to the touch. ? Pus or a bad smell coming from the rash. Get help right away if:  The rash is on your face or nose.  You have facial pain, pain around your eye area, or loss of feeling on one side of your face.  You have difficulty seeing.  You have ear pain or have ringing in your ear.  You have a loss of taste.  Your condition gets worse. Summary  Shingles, which is also known as herpes zoster, is an infection that causes a painful skin rash and fluid-filled blisters.  This condition is diagnosed with a skin exam. Skin or fluid samples may be taken from the blisters and examined before the diagnosis  is made.  Keep your rash covered with a loose bandage (dressing). Wear loose-fitting clothing to help ease the pain of material rubbing against the rash.  Before your blisters change into scabs, your shingles infection can cause chickenpox in people who have never had it or have never been vaccinated against it. This information is not intended to replace advice given to you by your health care provider. Make sure you discuss any questions you have with your health care provider. Document Revised: 08/16/2018 Document Reviewed: 12/27/2016 Elsevier Patient Education  2020 [employer].

## 2019-09-24 NOTE — Assessment & Plan Note (Signed)
Acute ? Related to shingles zofran prn - increase fluid, bland diet - increase as tolerated

## 2019-09-24 NOTE — Assessment & Plan Note (Signed)
Acute Symptoms and exam consistent with shingles - mandibular  Valtrex Gabapentin prn Call with questions or concerns.

## 2019-12-02 ENCOUNTER — Other Ambulatory Visit: Payer: Self-pay | Admitting: Internal Medicine

## 2020-02-12 ENCOUNTER — Encounter: Payer: BC Managed Care – PPO | Admitting: Internal Medicine

## 2020-02-17 NOTE — Progress Notes (Signed)
Subjective:    Patient ID: Morgan Hancock, female    DOB: Oct 09, 1945, 74 y.o.   MRN: [id number]   This visit occurred during the SARS-CoV-2 public health emergency.  Safety protocols were in place, including screening questions prior to the visit, additional usage of staff PPE, and extensive cleaning of exam room while observing appropriate contact time as indicated for disinfecting solutions.    HPI She is here for a physical exam.    She did finally get over the shingles.    She is still concerned about her hair thinning.  She has less hair on her arms and legs and figured that was normal.     Medications and allergies reviewed with patient and updated if appropriate.  Patient Active Problem List   Diagnosis Date Noted  . Herpes zoster without complication 85/63/1497  . Nausea 09/24/2019  . HNP (herniated nucleus pulposus), cervical 05/14/2019  . Carotid arterial disease ([place]) 06/13/2018  . Rectal bleeding 06/13/2018  . Prediabetes 12/20/2017  . Hair thinning 12/18/2017  . Atherosclerosis of aorta ([place]) 04/29/2017  . Right lower quadrant abdominal pain 04/18/2017  . Fibrocystic breast disease 11/20/2015  . Osteopenia, high frax 10/29/2015  . HLD (hyperlipidemia) 10/28/2015  . Fatty liver 10/28/2015  . Vitamin D deficiency 10/28/2015  . Cataract 10/28/2015  . DD (diverticular disease) 06/18/2013  . Hx of adenomatous colonic polyps 06/18/2013  . Hypertension 05/27/2013    Current Outpatient Medications on File Prior to Visit  Medication Sig Dispense Refill  . aspirin 81 MG chewable tablet Chew 81 mg by mouth at bedtime.    Marland Kitchen atenolol (TENORMIN) 50 MG tablet Take 1 tablet (50 mg total) by mouth daily. (Patient taking differently: Take 50 mg by mouth at bedtime. ) 90 tablet 3  . BIOTIN PO Take 1 tablet by mouth 2 (two) times daily.     Marland Kitchen CALCIUM-VITAMIN D PO Take 1 tablet by mouth 2 (two) times daily.     . fenofibrate (TRICOR) 145 MG tablet TAKE 1 TABLET BY MOUTH  EVERY DAY 90 tablet 3  . Omega-3 Fatty Acids (FISH OIL) 1200 MG CAPS Take by mouth.     No current facility-administered medications on file prior to visit.    Past Medical History:  Diagnosis Date  . Cataract   . Colon polyps 06/18/2013  . Fatty liver   . Hx of adenomatous colonic polyps 06/18/2013   Overview:  Had colonoscopy 06/18/2013 added new dx of colon polyps and Diverticuiosis   . Hyperlipidemia   . Hypertension   . Osteopenia after menopause 2019    Past Surgical History:  Procedure Laterality Date  . ABDOMINAL HYSTERECTOMY     prolapse  . ANTERIOR CERVICAL DECOMP/DISCECTOMY FUSION N/A 05/14/2019   Procedure: ANTERIOR CERVICAL DECOMPRESSION/DISCECTOMY FUSION CERVICAL FIVE- CERVICAL SIX, CERVICAL SIX- CERVICAL SEVEN, CERVICAL SEVEN- THORACIC ONE;  Surgeon: Jovita Gamma, MD;  Location: [facility];  Service: Neurosurgery;  Laterality: N/A;  ANTERIOR CERVICAL DECOMPRESSION/DISCECTOMY FUSION CERVICAL 5- CERVICAL 6, CERVICAL 6- CERVICAL 7, CERVICAL 7- THORACIC 1  . CHOLECYSTECTOMY    . COLONOSCOPY  06/18/2013  . GALLBLADDER SURGERY  [id number]  . TONSILLECTOMY  03/12/1952  . TUBAL LIGATION      Social History   Socioeconomic History  . Marital status: Married    Spouse name: Not on file  . Number of children: Not on file  . Years of education: Not on file  . Highest education level: Not on file  Occupational History  .  Not on file  Tobacco Use  . Smoking status: Never Smoker  . Smokeless tobacco: Never Used  Vaping Use  . Vaping Use: Never used  Substance and Sexual Activity  . Alcohol use: No  . Drug use: No  . Sexual activity: Not Currently  Other Topics Concern  . Not on file  Social History Narrative   Married   Exercise: gardening, mows lawn, [facility]- [facility]   No alcohol tobacco or drug use   Social Determinants of Radio broadcast assistant Strain:   . Difficulty of Paying Living Expenses: Not on file  Food Insecurity:   . Worried About  Charity fundraiser in the Last Year: Not on file  . Ran Out of Food in the Last Year: Not on file  Transportation Needs:   . Lack of Transportation (Medical): Not on file  . Lack of Transportation (Non-Medical): Not on file  Physical Activity:   . Days of Exercise per Week: Not on file  . Minutes of Exercise per Session: Not on file  Stress:   . Feeling of Stress : Not on file  Social Connections:   . Frequency of Communication with Friends and Family: Not on file  . Frequency of Social Gatherings with Friends and Family: Not on file  . Attends Religious Services: Not on file  . Active Member of Clubs or Organizations: Not on file  . Attends Archivist Meetings: Not on file  . Marital Status: Not on file    Family History  Problem Relation Age of Onset  . Hyperlipidemia Mother   . Heart disease Father   . Diabetes Father   . Parkinson's disease Father   . Hyperlipidemia Brother   . Hypertension Brother   . Miscarriages / Stillbirths Maternal Grandmother   . Miscarriages / Stillbirths Paternal Grandfather   . Colon cancer Neg Hx   . Stomach cancer Neg Hx   . Colon polyps Neg Hx   . Esophageal cancer Neg Hx   . Rectal cancer Neg Hx     Review of Systems  Constitutional: Negative for chills and fever.  Eyes: Negative for visual disturbance.  Respiratory: Negative for cough, shortness of breath and wheezing.   Cardiovascular: Negative for chest pain, palpitations and leg swelling.  Gastrointestinal: Negative for abdominal pain, blood in stool, constipation, diarrhea and nausea.       No gerd  Genitourinary: Negative for dysuria and hematuria.  Musculoskeletal: Negative for arthralgias, back pain and neck pain (neck fatigue).  Skin: Negative for rash.  Neurological: Positive for dizziness (if moves fast). Negative for light-headedness and headaches.  Psychiatric/Behavioral: Negative for dysphoric mood. The patient is not nervous/anxious.        Objective:    Vitals:   02/18/20 0825  BP: (!) 146/78  Pulse: (!) 59  Temp: 98.1 F (36.7 C)  SpO2: 98%   Filed Weights   02/18/20 0825  Weight: 135 lb (61.2 kg)   Body mass index is 24.69 kg/m.  BP Readings from Last 3 Encounters:  02/18/20 (!) 146/78  09/24/19 (!) 152/80  08/19/19 128/80    Wt Readings from Last 3 Encounters:  02/18/20 135 lb (61.2 kg)  09/24/19 132 lb 6.4 oz (60.1 kg)  08/19/19 134 lb 9.6 oz (61.1 kg)     Physical Exam Constitutional: She appears well-developed and well-nourished. No distress.  HENT:  Head: Normocephalic and atraumatic.  Right Ear: External ear normal. Normal ear canal and  TM Left Ear: External ear normal.  Normal ear canal and TM Mouth/Throat: Oropharynx is clear and moist.  Eyes: Conjunctivae and EOM are normal.  Neck: Neck supple. No tracheal deviation present. No thyromegaly present.  No carotid bruit  Cardiovascular: Normal rate, regular rhythm and normal heart sounds.   No murmur heard.  No edema. Pulmonary/Chest: Effort normal and breath sounds normal. No respiratory distress. She has no wheezes. She has no rales.  Breast: deferred   Abdominal: Soft. She exhibits no distension. There is no tenderness.  Lymphadenopathy: She has no cervical adenopathy.  Skin: Skin is warm and dry. She is not diaphoretic.  Psychiatric: She has a normal mood and affect. Her behavior is normal.        Assessment & Plan:   Physical exam: Screening blood work    ordered Immunizations  Flu vaccine deferred Colonoscopy  Up to date  Mammogram  Up to date  Gyn  n/a Dexa  Due  - [facility] exams   Up to date  Exercise  walks Weight  normal Substance abuse  None Sees derm     Screened for depression using the PHQ 2 scale.  No evidence of depression.     See Problem List for Assessment and Plan of chronic medical problems.

## 2020-02-17 NOTE — Patient Instructions (Addendum)

## 2020-02-18 ENCOUNTER — Other Ambulatory Visit: Payer: Self-pay

## 2020-02-18 ENCOUNTER — Encounter: Payer: Self-pay | Admitting: Internal Medicine

## 2020-02-18 ENCOUNTER — Ambulatory Visit (INDEPENDENT_AMBULATORY_CARE_PROVIDER_SITE_OTHER): Payer: Medicare Other | Admitting: Internal Medicine

## 2020-02-18 VITALS — BP 146/78 | HR 59 | Temp 98.1°F | Ht 62.0 in | Wt 135.0 lb

## 2020-02-18 DIAGNOSIS — I700 Atherosclerosis of aorta: Secondary | ICD-10-CM | POA: Diagnosis not present

## 2020-02-18 DIAGNOSIS — I10 Essential (primary) hypertension: Secondary | ICD-10-CM | POA: Diagnosis not present

## 2020-02-18 DIAGNOSIS — R7303 Prediabetes: Secondary | ICD-10-CM

## 2020-02-18 DIAGNOSIS — Z0000 Encounter for general adult medical examination without abnormal findings: Secondary | ICD-10-CM | POA: Diagnosis not present

## 2020-02-18 DIAGNOSIS — E559 Vitamin D deficiency, unspecified: Secondary | ICD-10-CM

## 2020-02-18 DIAGNOSIS — E782 Mixed hyperlipidemia: Secondary | ICD-10-CM

## 2020-02-18 DIAGNOSIS — M85859 Other specified disorders of bone density and structure, unspecified thigh: Secondary | ICD-10-CM

## 2020-02-18 LAB — LIPID PANEL
Cholesterol: 188 mg/dL (ref 0–200)
HDL: 52 mg/dL (ref >39.00)
LDL Cholesterol: 123 mg/dL — ABNORMAL HIGH (ref 0–99)
NonHDL: 135.6
Total CHOL/HDL Ratio: 4
Triglycerides: 63 mg/dL (ref 0.0–149.0)
VLDL: 12.6 mg/dL (ref 0.0–40.0)

## 2020-02-18 LAB — COMPREHENSIVE METABOLIC PANEL
ALT: 22 U/L (ref 0–35)
AST: 28 U/L (ref 0–37)
Albumin: 4.2 g/dL (ref 3.5–5.2)
Alkaline Phosphatase: 45 U/L (ref 39–117)
BUN: 21 mg/dL (ref 6–23)
CO2: 29 mEq/L (ref 19–32)
Calcium: 9.8 mg/dL (ref 8.4–10.5)
Chloride: 104 mEq/L (ref 96–112)
Creatinine, Ser: 1.03 mg/dL (ref 0.40–1.20)
GFR: 53.41 mL/min — ABNORMAL LOW (ref >60.00)
Glucose, Bld: 90 mg/dL (ref 70–99)
Potassium: 4 mEq/L (ref 3.5–5.1)
Sodium: 142 mEq/L (ref 135–145)
Total Bilirubin: 0.5 mg/dL (ref 0.2–1.2)
Total Protein: 7.4 g/dL (ref 6.0–8.3)

## 2020-02-18 LAB — VITAMIN D 25 HYDROXY (VIT D DEFICIENCY, FRACTURES): VITD: 36.8 ng/mL (ref 30.00–100.00)

## 2020-02-18 LAB — CBC WITH DIFFERENTIAL/PLATELET
Basophils Absolute: 0 10*3/uL (ref 0.0–0.1)
Basophils Relative: 0.4 % (ref 0.0–3.0)
Eosinophils Absolute: 0.3 10*3/uL (ref 0.0–0.7)
Eosinophils Relative: 5.1 % — ABNORMAL HIGH (ref 0.0–5.0)
HCT: 40.5 % (ref 36.0–46.0)
Hemoglobin: 13.3 g/dL (ref 12.0–15.0)
Lymphocytes Relative: 30 % (ref 12.0–46.0)
Lymphs Abs: 1.7 10*3/uL (ref 0.7–4.0)
MCHC: 32.8 g/dL (ref 30.0–36.0)
MCV: 91.4 fl (ref 78.0–100.0)
Monocytes Absolute: 0.4 10*3/uL (ref 0.1–1.0)
Monocytes Relative: 7.7 % (ref 3.0–12.0)
Neutro Abs: 3.3 10*3/uL (ref 1.4–7.7)
Neutrophils Relative %: 56.8 % (ref 43.0–77.0)
Platelets: 236 10*3/uL (ref 150.0–400.0)
RBC: 4.43 Mil/uL (ref 3.87–5.11)
RDW: 14.3 % (ref 11.5–15.5)
WBC: 5.8 10*3/uL (ref 4.0–10.5)

## 2020-02-18 LAB — HEPATIC FUNCTION PANEL
ALT: 23 U/L (ref 0–35)
AST: 30 U/L (ref 0–37)
Albumin: 4.2 g/dL (ref 3.5–5.2)
Alkaline Phosphatase: 45 U/L (ref 39–117)
Bilirubin, Direct: 0.1 mg/dL (ref 0.0–0.3)
Total Bilirubin: 0.5 mg/dL (ref 0.2–1.2)
Total Protein: 7.4 g/dL (ref 6.0–8.3)

## 2020-02-18 LAB — TSH: TSH: 2.52 u[IU]/mL (ref 0.35–4.50)

## 2020-02-18 LAB — HEMOGLOBIN A1C: Hgb A1c MFr Bld: 6.3 % (ref 4.6–6.5)

## 2020-02-18 MED ORDER — ROSUVASTATIN CALCIUM 5 MG PO TABS: 5.0000 mg | ORAL_TABLET | ORAL | 3 refills | Status: DC

## 2020-02-18 MED ORDER — FENOFIBRATE 145 MG PO TABS
145.0000 mg | ORAL_TABLET | Freq: Every day | ORAL | 3 refills | Status: DC
Start: 2020-02-18 — End: 2021-02-21

## 2020-02-18 MED ORDER — ATENOLOL 50 MG PO TABS
50.0000 mg | ORAL_TABLET | Freq: Every day | ORAL | 3 refills | Status: DC
Start: 2020-02-18 — End: 2021-02-11

## 2020-02-18 NOTE — Assessment & Plan Note (Signed)
Chronic - seen on CT scan Will to try crestor 5 mg twice a week - this should hopefully be just enough to lower LDL and she will tolerate it - if she develops joint pain she will stop Lipids panel today and again in 4-6 weeks with hepatic function

## 2020-02-18 NOTE — Assessment & Plan Note (Signed)
Chronic dexa due - ordered for [facility] Walking for exericse Check vitamin d level Taking vitamin d and calcium

## 2020-02-18 NOTE — Assessment & Plan Note (Signed)
Chronic Check a1c Low sugar / carb diet Stressed regular exercise  

## 2020-02-18 NOTE — Assessment & Plan Note (Signed)
Chronic Taking vitamin D daily Check vitamin D level  

## 2020-02-18 NOTE — Assessment & Plan Note (Signed)
Chronic BP well controlled at home 114-128/62-78, slightly elevated here Continue atenolol 50 daily cmp

## 2020-02-18 NOTE — Assessment & Plan Note (Signed)
Chronic Check lipid panel today and in 4-6 weeks Continue fenofibrate, will add crestor 5 mg twice a week given atherosclerosis Regular exercise and healthy diet encouraged

## 2020-02-19 NOTE — Addendum Note (Signed)
Addended by: Binnie Rail on: 02/19/2020 01:32 PM   Modules accepted: Orders

## 2020-02-27 ENCOUNTER — Encounter: Payer: Self-pay | Admitting: Internal Medicine

## 2020-03-02 ENCOUNTER — Other Ambulatory Visit: Payer: Self-pay

## 2020-03-02 ENCOUNTER — Ambulatory Visit (INDEPENDENT_AMBULATORY_CARE_PROVIDER_SITE_OTHER)
Admission: RE | Admit: 2020-03-02 | Discharge: 2020-03-02 | Disposition: A | Discharge status: Home / Self Care | Payer: Medicare Other | Source: Ambulatory Visit

## 2020-03-02 DIAGNOSIS — M85859 Other specified disorders of bone density and structure, unspecified thigh: Secondary | ICD-10-CM | POA: Diagnosis not present

## 2020-03-06 ENCOUNTER — Encounter: Payer: Self-pay | Admitting: Internal Medicine

## 2020-03-11 NOTE — Progress Notes (Signed)
Subjective:    Patient ID: Morgan Hancock, female    DOB: [birth year], 74 y.o.   MRN: [id number]  HPI The patient is here for an acute visit to discuss medications for her bones.  Date of study: 03/02/2020 Comparison: 01/17/2018 Clinical data: Pt is a 74 y.o. female without previous history of fracture. On calcium and vitamin D.  Results:  Lumbar spine L1-L4 (L2) Femoral neck (FN) 33% distal radius  T-score  +0.6 RFN: -1.5 LFN: -1.5 n/a  Change in BMD from previous DXA test (%)  -1.3%  -3.0% n/a  (*) statistically significant  Assessment: the BMD is low according to the WHO  Fracture risk: moderate FRAX score: 10 year major osteoporotic risk: 18.4%. 10 year hip fracture risk: 8.4%.  She walks for exercise and is taking calcium and vitamin d daily.  Her vitamin d level is 36.8.    She denies falls.  She feels her balance is good.    Medications and allergies reviewed with patient and updated if appropriate.  Patient Active Problem List   Diagnosis Date Noted   Herpes zoster without complication 02/77/4128   Nausea 09/24/2019   HNP (herniated nucleus pulposus), cervical 05/14/2019   Carotid arterial disease ([place]) 06/13/2018   Rectal bleeding 06/13/2018   Prediabetes 12/20/2017   Hair thinning 12/18/2017   Atherosclerosis of aorta ([place]) 04/29/2017   Right lower quadrant abdominal pain 04/18/2017   Fibrocystic breast disease 11/20/2015   Osteopenia, high frax 10/29/2015   HLD (hyperlipidemia) 10/28/2015   Fatty liver 10/28/2015   Vitamin D deficiency 10/28/2015   Cataract 10/28/2015   DD (diverticular disease) 06/18/2013   Hx of adenomatous colonic polyps 06/18/2013   Hypertension 05/27/2013    Current Outpatient Medications on File Prior to Visit  Medication Sig Dispense Refill   aspirin 81 MG chewable tablet Chew 81 mg by mouth at bedtime.     atenolol (TENORMIN) 50 MG tablet Take 1 tablet (50 mg total) by mouth daily. 90 tablet 3   BIOTIN  PO Take 1 tablet by mouth 2 (two) times daily.      CALCIUM-VITAMIN D PO Take 1 tablet by mouth 2 (two) times daily.      Coenzyme Q10 (CO Q 10) 10 MG CAPS Take by mouth.     fenofibrate (TRICOR) 145 MG tablet Take 1 tablet (145 mg total) by mouth daily. 90 tablet 3   Omega-3 Fatty Acids (FISH OIL) 1200 MG CAPS Take by mouth.     rosuvastatin (CRESTOR) 5 MG tablet Take 1 tablet (5 mg total) by mouth 2 (two) times a week. 27 tablet 3   No current facility-administered medications on file prior to visit.    Past Medical History:  Diagnosis Date   Cataract    Colon polyps 06/18/2013   Fatty liver    Hx of adenomatous colonic polyps 06/18/2013   Overview:  Had colonoscopy 06/18/2013 added new dx of colon polyps and Diverticuiosis    Hyperlipidemia    Hypertension    Osteopenia after menopause 2019    Past Surgical History:  Procedure Laterality Date   ABDOMINAL HYSTERECTOMY     prolapse   ANTERIOR CERVICAL DECOMP/DISCECTOMY FUSION N/A 05/14/2019   Procedure: ANTERIOR CERVICAL DECOMPRESSION/DISCECTOMY FUSION CERVICAL FIVE- CERVICAL SIX, CERVICAL SIX- CERVICAL SEVEN, CERVICAL SEVEN- THORACIC ONE;  Surgeon: Jovita Gamma, MD;  Location: [facility];  Service: Neurosurgery;  Laterality: N/A;  ANTERIOR CERVICAL DECOMPRESSION/DISCECTOMY FUSION CERVICAL 5- CERVICAL 6, CERVICAL 6- CERVICAL 7, CERVICAL 7- THORACIC 1  CHOLECYSTECTOMY     COLONOSCOPY  06/18/2013   GALLBLADDER SURGERY  [id number]   TONSILLECTOMY  03/12/1952   TUBAL LIGATION      Social History   Socioeconomic History   Marital status: Married    Spouse name: Not on file   Number of children: Not on file   Years of education: Not on file   Highest education level: Not on file  Occupational History   Not on file  Tobacco Use   Smoking status: Never Smoker   Smokeless tobacco: Never Used  Vaping Use   Vaping Use: Never used  Substance and Sexual Activity   Alcohol use: No   Drug use: No    Sexual activity: Not Currently  Other Topics Concern   Not on file  Social History Narrative   Married   Exercise: gardening, mows lawn, goes to [employer] in fall- winter   No alcohol tobacco or drug use   Social Determinants of Radio broadcast assistant Strain:    Difficulty of Paying Living Expenses: Not on file  Food Insecurity:    Worried About Charity fundraiser in the Last Year: Not on file   [employer] of Food in the Last Year: Not on file  Transportation Needs:    Lack of Transportation (Medical): Not on file   Lack of Transportation (Non-Medical): Not on file  Physical Activity:    Days of Exercise per Week: Not on file   Minutes of Exercise per Session: Not on file  Stress:    Feeling of Stress : Not on file  Social Connections:    Frequency of Communication with Friends and Family: Not on file   Frequency of Social Gatherings with Friends and Family: Not on file   Attends Religious Services: Not on file   Active Member of Clubs or Organizations: Not on file   Attends Archivist Meetings: Not on file   Marital Status: Not on file    Family History  Problem Relation Age of Onset   Hyperlipidemia Mother    Heart disease Father    Diabetes Father    Parkinson's disease Father    Hyperlipidemia Brother    Hypertension Brother    Miscarriages / Stillbirths Maternal Grandmother    Miscarriages / Stillbirths Paternal Grandfather    Colon cancer Neg Hx    Stomach cancer Neg Hx    Colon polyps Neg Hx    Esophageal cancer Neg Hx    Rectal cancer Neg Hx     Review of Systems     Objective:   Vitals:   03/12/20 1037  BP: 134/84  Pulse: 63  Temp: 97.9 F (36.6 C)  SpO2: 99%   BP Readings from Last 3 Encounters:  03/12/20 134/84  02/18/20 (!) 146/78  09/24/19 (!) 152/80   Wt Readings from Last 3 Encounters:  03/12/20 135 lb 9.6 oz (61.5 kg)  02/18/20 135 lb (61.2 kg)  09/24/19 132 lb 6.4 oz (60.1 kg)   Body mass index  is 24.8 kg/m.   Physical Exam         Assessment & Plan:     See Problem List for Assessment and Plan of chronic medical problems.    This visit occurred during the SARS-CoV-2 public health emergency.  Safety protocols were in place, including screening questions prior to the visit, additional usage of staff PPE, and extensive cleaning of exam room while observing appropriate contact time as indicated for disinfecting solutions.

## 2020-03-11 NOTE — Patient Instructions (Addendum)
Start Fosamax once a week for your bones.    Continue regular walking and daily calcium and vitamin.    We will recheck a bone density in 1-2 years.     Osteoporosis  Osteoporosis is thinning and loss of density in your bones. Osteoporosis makes bones more brittle and fragile and more likely to break (fracture). Over time, osteoporosis can cause your bones to become so weak that they fracture after a minor fall. Bones in the hip, wrist, and spine are most likely to fracture due to osteoporosis. What are the causes? The exact cause of this condition is not known. What increases the risk? You may be at greater risk for osteoporosis if you:  Have a family history of the condition.  Have poor nutrition.  Use steroid medicines, such as prednisone.  Are female.  Are age 22 or older.  Smoke or have a history of smoking.  Are not physically active (are sedentary).  Are white (Caucasian) or of Asian descent.  Have a small body frame.  Take certain medicines, such as antiseizure medicines. What are the signs or symptoms? A fracture might be the first sign of osteoporosis, especially if the fracture results from a fall or injury that usually would not cause a bone to break. Other signs and symptoms include:  Pain in the neck or low back.  Stooped posture.  Loss of height. How is this diagnosed? This condition may be diagnosed based on:  Your medical history.  A physical exam.  A bone mineral density test, also called a DXA or DEXA test (dual-energy X-ray absorptiometry test). This test uses X-rays to measure the amount of minerals in your bones. How is this treated? The goal of treatment is to strengthen your bones and lower your risk for a fracture. Treatment may involve:  Making lifestyle changes, such as: ? Including foods with more calcium and vitamin D in your diet. ? Doing weight-bearing and muscle-strengthening exercises. ? Stopping tobacco use. ? Limiting  alcohol intake.  Taking medicine to slow the process of bone loss or to increase bone density.  Taking daily supplements of calcium and vitamin D.  Taking hormone replacement medicines, such as estrogen for women and testosterone for men.  Monitoring your levels of calcium and vitamin D. Follow these instructions at home:  Activity  Exercise as told by your health care provider. Ask your health care provider what exercises and activities are safe for you. You should do: ? Exercises that make you work against gravity (weight-bearing exercises), such as tai chi, yoga, or walking. ? Exercises to strengthen muscles, such as lifting weights. Lifestyle  Limit alcohol intake to no more than 1 drink a day for nonpregnant women and 2 drinks a day for men. One drink equals 12 oz of beer, 5 oz of wine, or 1 oz of hard liquor.  Do not use any products that contain nicotine or tobacco, such as cigarettes and e-cigarettes. If you need help quitting, ask your health care provider. Preventing falls  Use devices to help you move around (mobility aids) as needed, such as canes, walkers, scooters, or crutches.  Keep rooms well-lit and clutter-free.  Remove tripping hazards from walkways, including cords and throw rugs.  Install grab bars in bathrooms and safety rails on stairs.  Use rubber mats in the bathroom and other areas that are often wet or slippery.  Wear closed-toe shoes that fit well and support your feet. Wear shoes that have rubber soles or low heels.  Review your medicines with your health care provider. Some medicines can cause dizziness or changes in blood pressure, which can increase your risk of falling. General instructions  Include calcium and vitamin D in your diet. Calcium is important for bone health, and vitamin D helps your body to absorb calcium. Good sources of calcium and vitamin D include: ? Certain fatty fish, such as salmon and tuna. ? Products that have calcium  and vitamin D added to them (fortified products), such as fortified cereals. ? Egg yolks. ? Cheese. ? Liver.  Take over-the-counter and prescription medicines only as told by your health care provider.  Keep all follow-up visits as told by your health care provider. This is important. Contact a health care provider if:  You have never been screened for osteoporosis and you are: ? A woman who is age 80 or older. ? A man who is age 16 or older. Get help right away if:  You fall or injure yourself. Summary  Osteoporosis is thinning and loss of density in your bones. This makes bones more brittle and fragile and more likely to break (fracture),even with minor falls.  The goal of treatment is to strengthen your bones and reduce your risk for a fracture.  Include calcium and vitamin D in your diet. Calcium is important for bone health, and vitamin D helps your body to absorb calcium.  Talk with your health care provider about screening for osteoporosis if you are a woman who is age 63 or older, or a man who is age 31 or older. This information is not intended to replace advice given to you by your health care provider. Make sure you discuss any questions you have with your health care provider. Document Revised: 04/06/2017 Document Reviewed: 02/16/2017 Elsevier Patient Education  2020 [employer].

## 2020-03-12 ENCOUNTER — Encounter: Payer: Self-pay | Admitting: Internal Medicine

## 2020-03-12 ENCOUNTER — Ambulatory Visit (INDEPENDENT_AMBULATORY_CARE_PROVIDER_SITE_OTHER): Payer: Medicare Other | Admitting: Internal Medicine

## 2020-03-12 ENCOUNTER — Other Ambulatory Visit: Payer: Self-pay

## 2020-03-12 VITALS — BP 134/84 | HR 63 | Temp 97.9°F | Ht 62.0 in | Wt 135.6 lb

## 2020-03-12 DIAGNOSIS — M85852 Other specified disorders of bone density and structure, left thigh: Secondary | ICD-10-CM | POA: Diagnosis not present

## 2020-03-12 DIAGNOSIS — M85851 Other specified disorders of bone density and structure, right thigh: Secondary | ICD-10-CM

## 2020-03-12 MED ORDER — ALENDRONATE SODIUM 70 MG PO TABS: 70.0000 mg | ORAL_TABLET | ORAL | 3 refills | Status: DC

## 2020-03-14 NOTE — Assessment & Plan Note (Signed)
Chronic dexa reviewed with her today - discussed diagnosis and need to consider medication Will continue calcium and vitamin D daily Will continue regular walking Fall prevention discussed Reviewed different OP medications and possible side effects/costs and mechanism of action  We will start fosamax weekly Repeat dexa in 1-2 years

## 2020-03-24 ENCOUNTER — Encounter: Payer: Self-pay | Admitting: Internal Medicine

## 2020-03-25 NOTE — Telephone Encounter (Signed)
   Spouse calling, reports patient is getting a COVID test today. Advised caller the results of COVID test would be needed before a decision on treatments can be made They have questions about SubQ monoclonal Ab  Requesting call

## 2020-03-29 ENCOUNTER — Encounter: Payer: Self-pay | Admitting: Internal Medicine

## 2020-04-04 IMAGING — CR DG CERVICAL SPINE 2 OR 3 VIEWS
4 series · 4 of 4 positions shown · non-contrast
Comparison: Radiographs dated 03/21/2019

CLINICAL DATA: Cervical disc disease.

EXAM:
CERVICAL SPINE - 2-3 VIEW

[xtable lateral (1 of 4)]
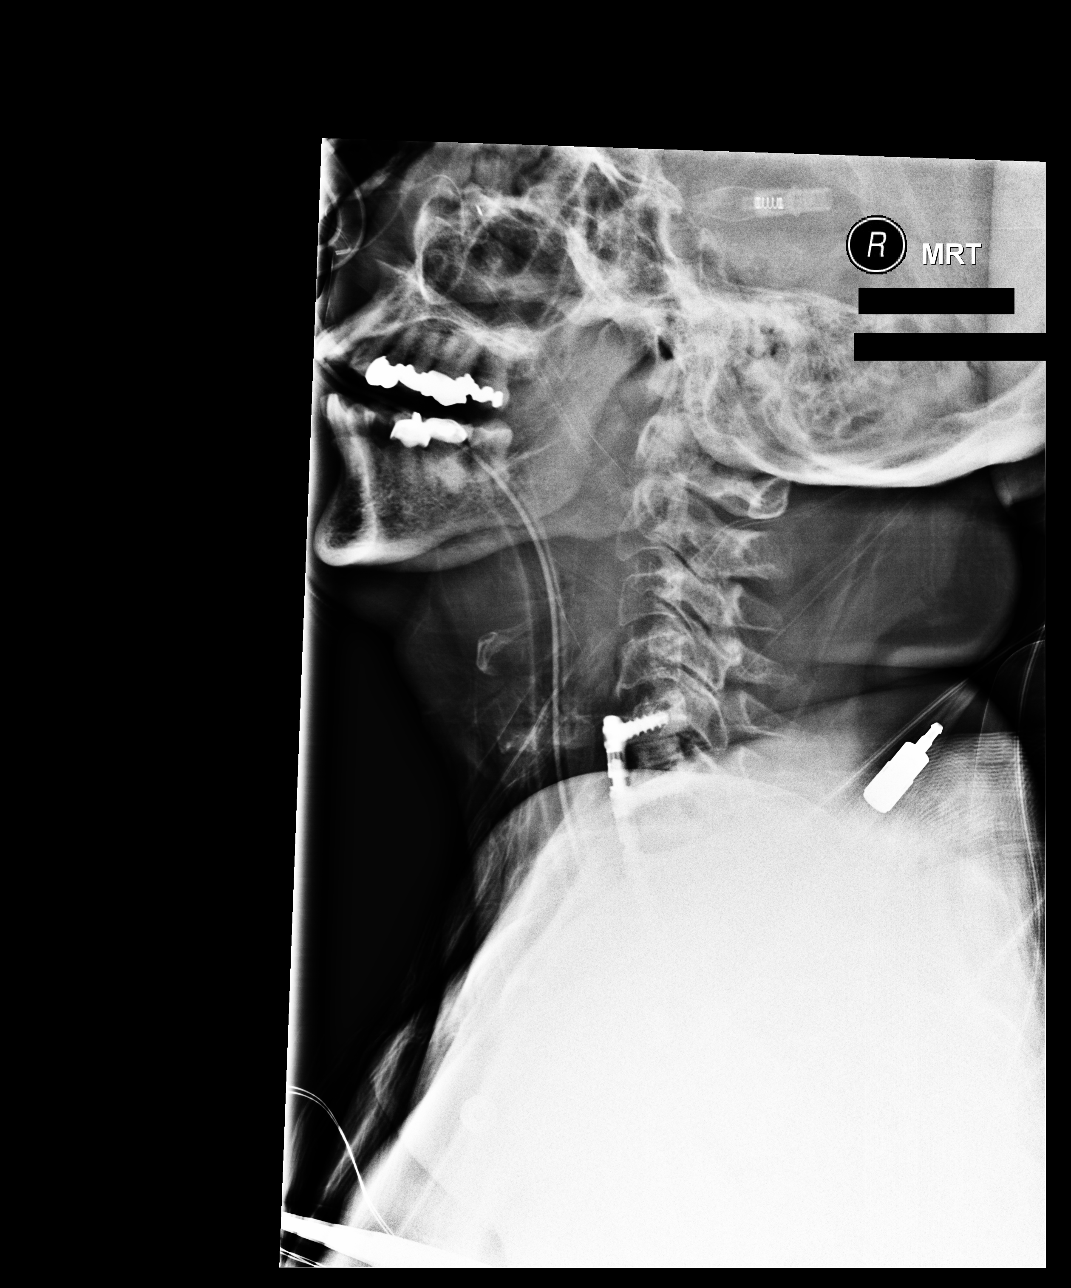

[xtable lateral (2 of 4)]
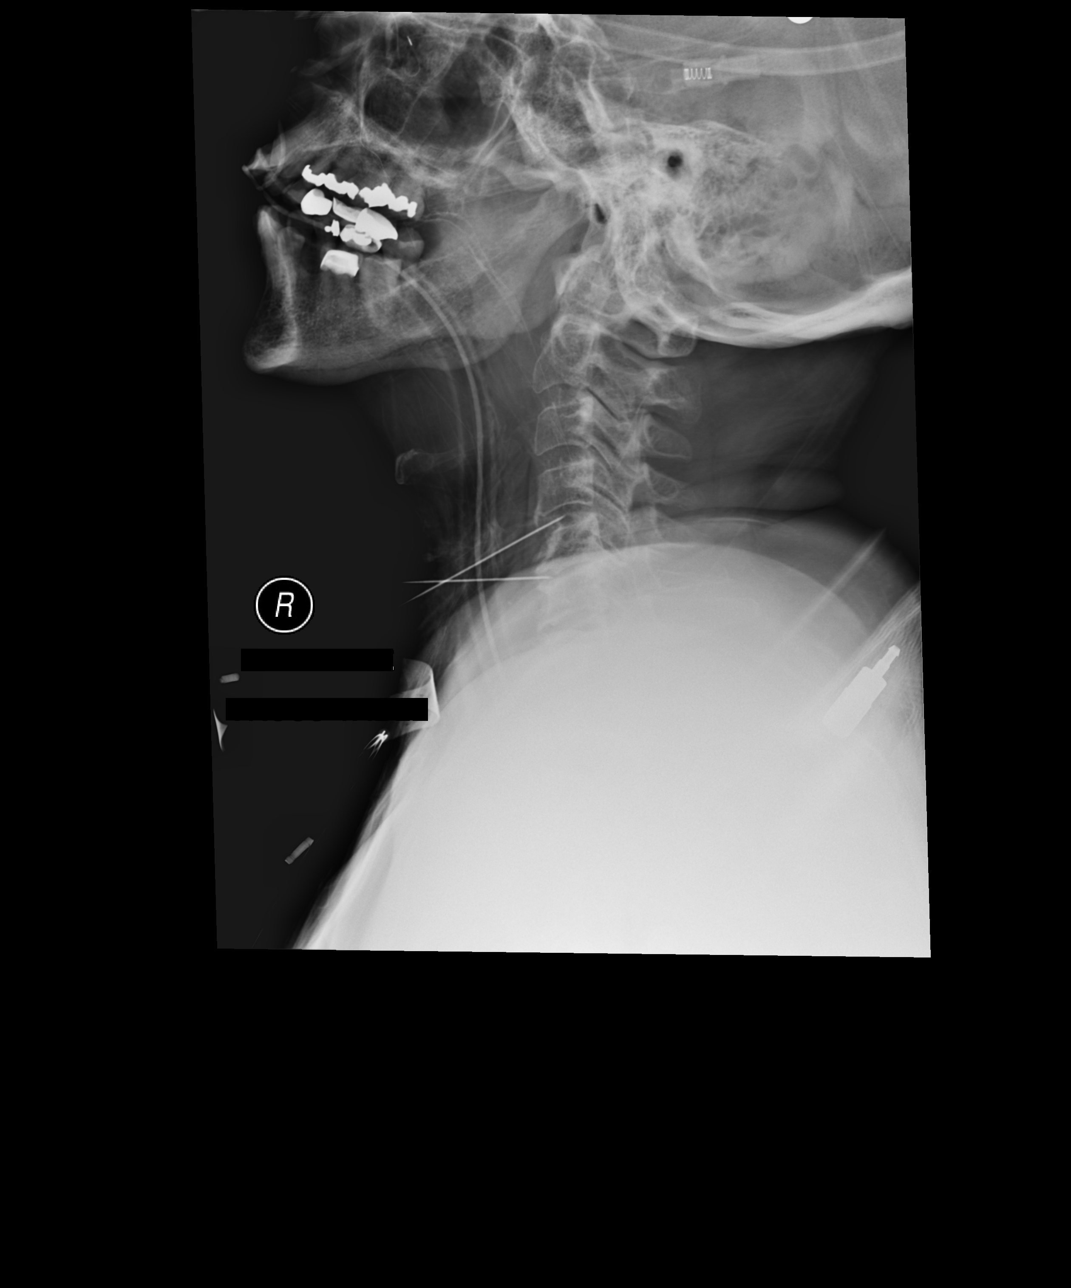

[xtable lateral (3 of 4)]
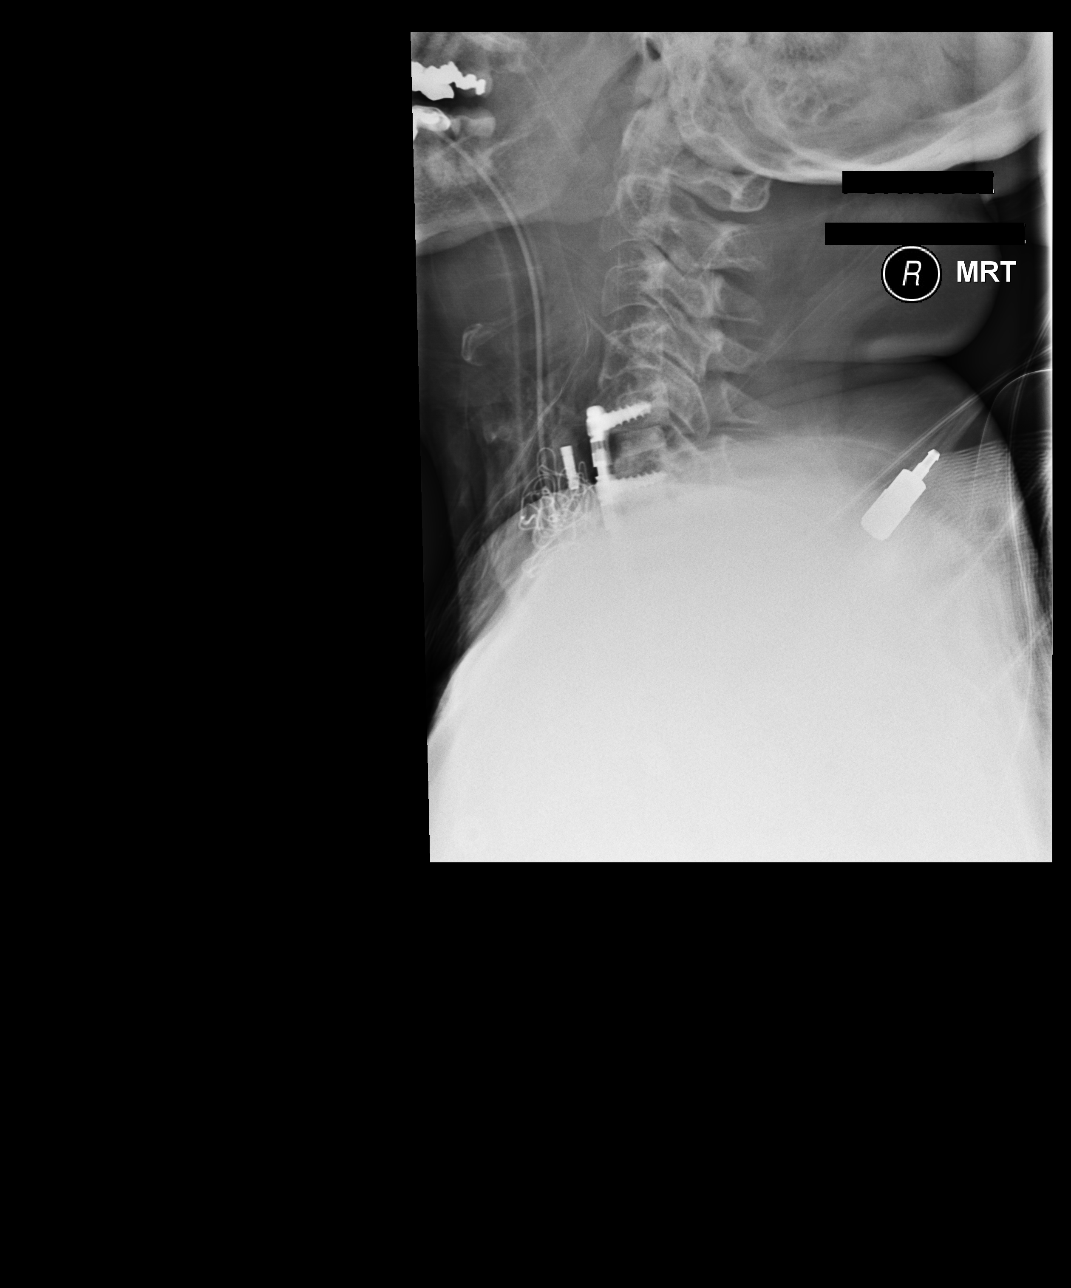

[xtable lateral (4 of 4)]
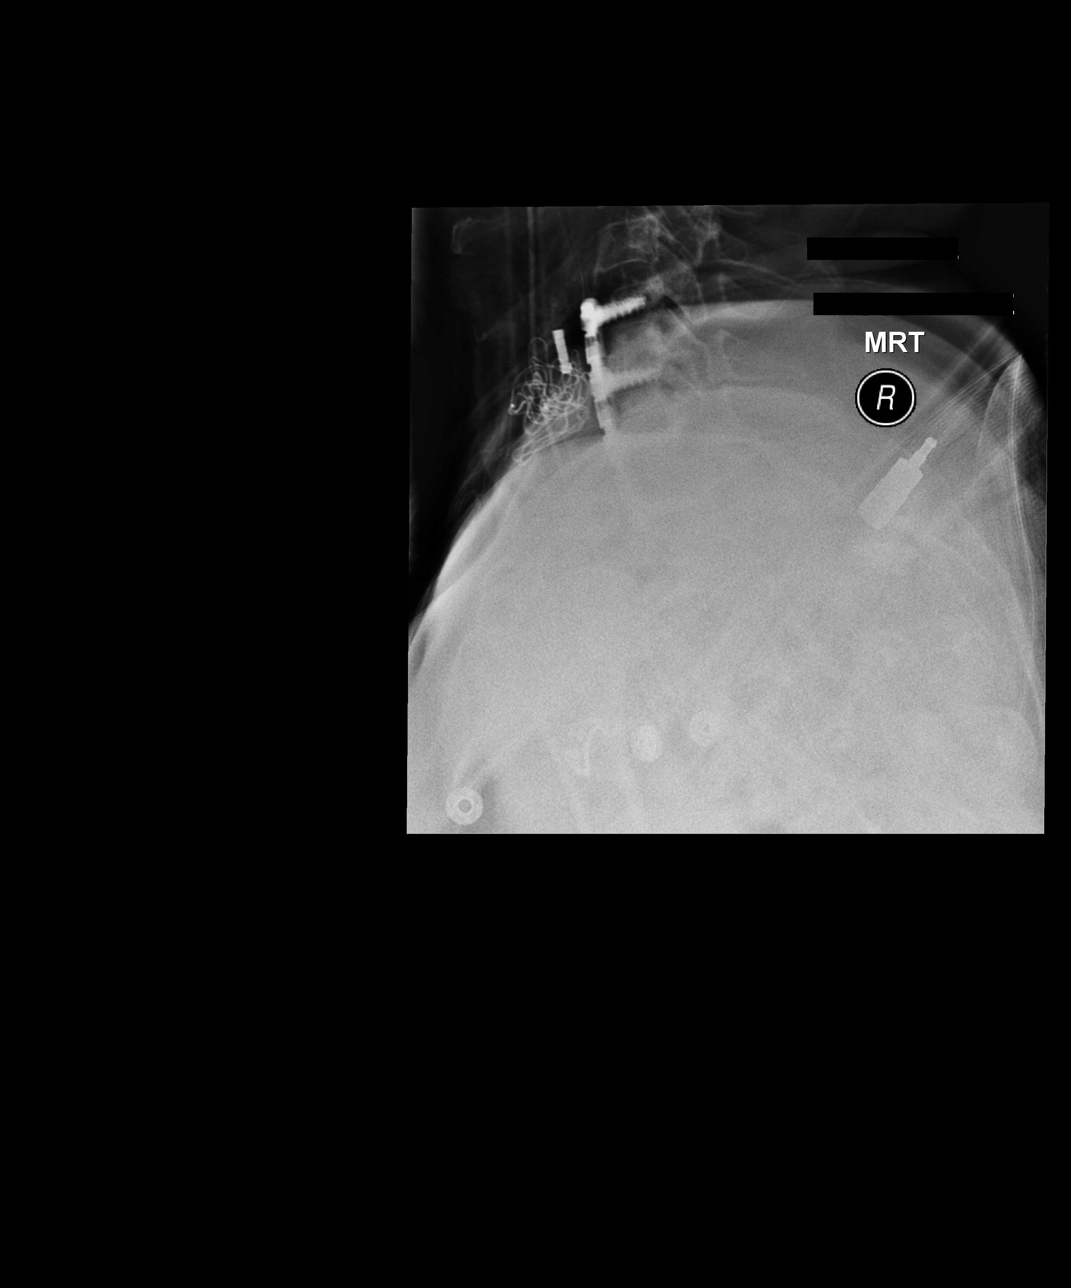

[4 of 4 positions shown; findings below may reference images not displayed]

FINDINGS: Lateral view of the cervical spine at [DATE] a.m. demonstrates needles
at the C4-5 and C5-6 levels.

Lateral view of the cervical spine at [DATE] a.m. demonstrates that
the patient has undergone anterior cervical fusion at C5-6, C6-7,
and C7-T1. Plate and screws and interbody fusion devices appear in
good position. Alignment is normal.
IMPRESSION: Satisfactory appearance of the cervical spine after anterior
cervical fusion at C5-6, C6-7, and C7-T1.

## 2020-04-26 ENCOUNTER — Telehealth: Payer: Self-pay | Admitting: Internal Medicine

## 2020-04-26 NOTE — Telephone Encounter (Signed)
I do not know what this is for and cannot advise.  It does not sound like anything she does need

## 2020-04-26 NOTE — Telephone Encounter (Signed)
Patient calling because she got a call from [employer] senior health advisor with [email or web address] and they wanted her to do a cheek swab to check her health. Patient wondering if this is something she needs to do. [phone]

## 2020-04-27 NOTE — Telephone Encounter (Signed)
Spoke with patient and info given. She states insurance had never heard of place.

## 2020-05-06 LAB — HM MAMMOGRAPHY

## 2020-06-04 ENCOUNTER — Encounter: Payer: Self-pay | Admitting: Internal Medicine

## 2020-06-04 NOTE — Progress Notes (Signed)
Outside notes received. Information abstracted. Notes sent to scan.  

## 2020-08-12 DIAGNOSIS — H524 Presbyopia: Secondary | ICD-10-CM | POA: Diagnosis not present

## 2020-09-28 ENCOUNTER — Telehealth: Payer: Self-pay | Admitting: Internal Medicine

## 2020-09-28 NOTE — Progress Notes (Signed)
  Chronic Care Management   Note  09/28/2020 Name: Morgan Hancock MRN: [id number] DOB: 04-24-1946  Morgan Hancock is a 75 y.o. year old female who is a primary care patient of Burns, Claudina Lick, MD. I reached out to Geroge Baseman by phone today in response to a referral sent by Morgan Hancock's PCP, Binnie Rail, MD.   Morgan Hancock was given information about Chronic Care Management services today including:  1. CCM service includes personalized support from designated clinical staff supervised by her physician, including individualized plan of care and coordination with other care providers 2. 24/7 contact phone numbers for assistance for urgent and routine care needs. 3. Service will only be billed when office clinical staff spend 20 minutes or more in a month to coordinate care. 4. Only one practitioner may furnish and bill the service in a calendar month. 5. The patient may stop CCM services at any time (effective at the end of the month) by phone call to the office staff.   Patient agreed to services and verbal consent obtained.   Follow up plan:  [place]

## 2020-10-11 DIAGNOSIS — Z981 Arthrodesis status: Secondary | ICD-10-CM | POA: Diagnosis not present

## 2020-10-11 DIAGNOSIS — M5030 Other cervical disc degeneration, unspecified cervical region: Secondary | ICD-10-CM | POA: Diagnosis not present

## 2020-11-01 ENCOUNTER — Other Ambulatory Visit: Payer: Self-pay

## 2020-11-01 ENCOUNTER — Ambulatory Visit (INDEPENDENT_AMBULATORY_CARE_PROVIDER_SITE_OTHER): Payer: Medicare Other

## 2020-11-01 DIAGNOSIS — I10 Essential (primary) hypertension: Secondary | ICD-10-CM | POA: Diagnosis not present

## 2020-11-01 DIAGNOSIS — E782 Mixed hyperlipidemia: Secondary | ICD-10-CM | POA: Diagnosis not present

## 2020-11-01 DIAGNOSIS — I6523 Occlusion and stenosis of bilateral carotid arteries: Secondary | ICD-10-CM

## 2020-11-01 DIAGNOSIS — M85852 Other specified disorders of bone density and structure, left thigh: Secondary | ICD-10-CM

## 2020-11-01 NOTE — Patient Instructions (Addendum)
Visit Information   PATIENT GOALS:   Goals Addressed             This Visit's Progress    Prevent Falls and Broken Bones-Osteopenia       Timeframe:  Long-Range Goal Priority:  High Start Date:   11/01/2020                          Expected End Date: 05/03/2021                      Follow Up Date 05/03/2021    - add more outdoor lighting - always use handrails on the stairs - get at least 10 minutes of activity every day - keep cell phone with me always    Why is this important?   When you fall, there are 3 things that control if a bone breaks or not.  These are the fall itself, how hard and the direction that you fall and how fragile your bones are.  Preventing falls is very important for you because of fragile bones.          Track and Manage My Blood Pressure-Hypertension       Timeframe:  Long-Range Goal Priority:  High Start Date:  11/01/2020                         Expected End Date:   05/03/2021                  Follow Up Date 05/03/2021   - check blood pressure weekly - choose a place to take my blood pressure (home, clinic or office, retail store) - write blood pressure results in a log or diary    Why is this important?   You won't feel high blood pressure, but it can still hurt your blood vessels.  High blood pressure can cause heart or kidney problems. It can also cause a stroke.  Making lifestyle changes like losing a little weight or eating less salt will help.  Checking your blood pressure at home and at different times of the day can help to control blood pressure.  If the doctor prescribes medicine remember to take it the way the doctor ordered.  Call the office if you cannot afford the medicine or if there are questions about it.             Consent to CCM Services: Morgan Hancock was given information about Chronic Care Management services today including:  CCM service includes personalized support from designated clinical staff supervised by her  physician, including individualized plan of care and coordination with other care providers 24/7 contact phone numbers for assistance for urgent and routine care needs. Service will only be billed when office clinical staff spend 20 minutes or more in a month to coordinate care. Only one practitioner may furnish and bill the service in a calendar month. The patient may stop CCM services at any time (effective at the end of the month) by phone call to the office staff. The patient will be responsible for cost sharing (co-pay) of up to 20% of the service fee (after annual deductible is met).  Patient agreed to services and verbal consent obtained.   Patient verbalizes understanding of instructions provided today and agrees to view in [place].   Telephone follow up appointment with care management team member scheduled for: 6 months  The patient  has been provided with contact information for the care management team and has been advised to call with any health related questions or concerns.  Next PCP appointment scheduled for: 11/15/2020  Tomasa Blase, PharmD Clinical Pharmacist, [place]      CLINICAL CARE PLAN: Patient Care Plan: CCM Care Plan     Problem Identified: HTN, HLD, CAD, Osteopenia   Priority: High  Onset Date: 11/01/2020     Long-Range Goal: Disease Management   Start Date: 11/01/2020  Expected End Date: 05/03/2021  This Visit's Progress: On track  Priority: High  Note:   Current Barriers:  Unable to independently monitor therapeutic efficacy  Pharmacist Clinical Goal(s):  Patient will achieve adherence to monitoring guidelines and medication adherence to achieve therapeutic efficacy achieve control of LDL  as evidenced by next lipid panel maintain control of blood pressure as evidenced by blood pressure log  through collaboration with PharmD and provider.   Interventions: 1:1 collaboration with Binnie Rail, MD regarding development and update of  comprehensive plan of care as evidenced by provider attestation and co-signature Inter-disciplinary care team collaboration (see longitudinal plan of care) Comprehensive medication review performed; medication list updated in electronic medical record  Hypertension (BP goal <140/90) -Controlled -Current treatment: Atenolol 72m daily  -Medications previously tried:  n/a -Current home readings: 114/64, reports that blood pressure averaging ~110-120/ 65-80 - HR averaging 62-64 -Current dietary habits: eats sodium reduced diet, typically only drinks caffeine in AM if any -Current exercise habits: walking daily  -Reports symptoms of orthostatic hypotension on occasion - reviewed with patient to move from sitting / standing position slowly, hold on to something to provide stability, and to ensure that she is not having any symptoms before she starts walking to avoid falls  -Educated on BP goals and benefits of medications for prevention of heart attack, stroke and kidney damage; Daily salt intake goal < 2300 mg; Exercise goal of 150 minutes per week; Importance of home blood pressure monitoring; Proper BP monitoring technique; Symptoms of hypotension and importance of maintaining adequate hydration; -Counseled to monitor BP at home once weekly, document, and provide log at future appointments -Counseled on diet and exercise extensively Recommended to continue current medication  Hyperlipidemia: (LDL goal < 100) -Not ideally controlled -Last LDL - 123 mg/dL (02/18/2020) - has not had lipid panel rechecked since starting crestor  -Current treatment: Rosuvastatin 553m-twice weekly  Fenofibrate 14539maily Fish Oil 1000m69mily  CoQ10 - 100mg84mly  Aspirin 81mg 48my  -Medications previously tried: atorvastatin - caused muscle pains , vascepa  -Current dietary patterns: trying to moderate fried / fatty food intake / reduce red meat intake  -Current exercise habits: walking daily  -Educated  on Cholesterol goals;  Benefits of statin for ASCVD risk reduction; Importance of limiting foods high in cholesterol; Exercise goal of 150 minutes per week; Strategies to manage statin-induced myalgias; -Counseled on diet and exercise extensively Recommended to continue current medication - possible in future patient may be able to stop fenofibrate, recheck after next lipid panel and discuss   Osteoporosis / Osteopenia (Goal 01/18/2018) -Controlled -Last DEXA Scan: 03/02/2020   T-Score right femoral neck: -1.3  T-Score left femoral neck: -1.4  T-Score lumbar spine: -0.1  10-year probability of major osteoporotic fracture: 15.9%  10-year probability of hip fracture: 4.5% -Patient is a candidate for pharmacologic treatment due to T-Score -1.0 to -2.5 and 10-year risk of hip fracture > 3% -Current treatment  Alendronate 70mg w47my (Started 03/2020)  Calcium/vitamin D - 124m / 1000 units - 1 tablet twice daily  -Medications previously tried: n/a  -Recommend [phone] units of vitamin D daily. Recommend 1200 mg of calcium daily from dietary and supplemental sources. Counseled on oral bisphosphonate administration: take in the morning, 30 minutes prior to food with 6-8 oz of water. Do not lie down for at least 30 minutes after taking. -Recommended to continue current medication   Health Maintenance -Vaccine gaps: shingles and COVID booster  -Current therapy:  Biotin -  50025m daily  -Educated on Cost vs benefit of each product must be carefully weighed by individual consumer -Patient is satisfied with current therapy and denies issues -Recommended to continue current medication  Patient Goals/Self-Care Activities Patient will:  - take medications as prescribed check blood pressure once weekly, document, and provide at future appointments engage in dietary modifications by reducing sodium / fried and fatty food intake   Follow Up Plan: Telephone follow up appointment with care  management team member scheduled for: 6 months  The patient has been provided with contact information for the care management team and has been advised to call with any health related questions or concerns.  Next PCP appointment scheduled for: 11/15/2020

## 2020-11-01 NOTE — Progress Notes (Signed)
Chronic Care Management Pharmacy Note  11/01/2020 Name:  Morgan Hancock MRN:  [id number] DOB:  May 07, 1946  Summary: - Patient started on crestor twice weekly after PCP appointment 02/2020, denies any issues or AE since starting medication  - Patient started alendronate once weekly after PCP appointment 03/2020 - denies any issues or AE since starting medication - Patient has been checking blood pressure at home, notes that BP is averaging <130/80 at home HR ~62-65 bpm, in office tends to elevate  Recommendations/Changes made from today's visit: - No changes to medications at this time  Plan: - Recheck lipid panel prior to next PCP appointment so that effectiveness of crestor can be assessed - Continue to monitor blood pressure at least once weekly  Subjective: Morgan Hancock is an 75 y.o. year old female who is a primary patient of Burns, Claudina Lick, MD.  The CCM team was consulted for assistance with disease management and care coordination needs.    Engaged with patient face to face for initial visit in response to provider referral for pharmacy case management and/or care coordination services.   Consent to Services:  The patient was given the following information about Chronic Care Management services today, agreed to services, and gave verbal consent: 1. CCM service includes personalized support from designated clinical staff supervised by the primary care provider, including individualized plan of care and coordination with other care providers 2. 24/7 contact phone numbers for assistance for urgent and routine care needs. 3. Service will only be billed when office clinical staff spend 20 minutes or more in a month to coordinate care. 4. Only one practitioner may furnish and bill the service in a calendar month. 5.The patient may stop CCM services at any time (effective at the end of the month) by phone call to the office staff. 6. The patient will be responsible for cost sharing  (co-pay) of up to 20% of the service fee (after annual deductible is met). Patient agreed to services and consent obtained.  Patient Care Team: Binnie Rail, MD as PCP - General (Internal Medicine) Phylliss Blakes, OD (Optometry) Delice Bison Darnelle Maffucci, [facility] as Pharmacist (Pharmacist)  Recent office visits: 03/12/2020 - PCP visit - reviewed dexa scan results -started alendronate once weekly 02/18/2020 - PCP visit - started crestor 50m twice weekly in setting of atherosclerosis   [facility] in [facility] 6 [facility]  Objective:  Lab Results  Component Value Date   CREATININE 1.03 02/18/2020   BUN 21 02/18/2020   GFR 53.41 (L) 02/18/2020   GFRNONAA >60 05/12/2019   GFRAA >60 05/12/2019   NA 142 02/18/2020   K 4.0 02/18/2020   CALCIUM 9.8 02/18/2020   CO2 29 02/18/2020   GLUCOSE 90 02/18/2020    Lab Results  Component Value Date/Time   HGBA1C 6.3 02/18/2020 09:02 AM   HGBA1C 6.3 01/09/2019 08:55 AM   GFR 53.41 (L) 02/18/2020 09:02 AM   GFR 50.25 (L) 01/09/2019 08:55 AM    Last diabetic Eye exam:  No results found for: HMDIABEYEEXA  Last diabetic Foot exam:  No results found for: HMDIABFOOTEX   Lab Results  Component Value Date   CHOL 188 02/18/2020   HDL 52.00 02/18/2020   LDLCALC 123 (H) 02/18/2020   TRIG 63.0 02/18/2020   CHOLHDL 4 02/18/2020    Hepatic Function Latest Ref Rng & Units 02/18/2020 02/18/2020 01/09/2019  Total Protein 6.0 - 8.3 g/dL 7.4 7.4 7.3  Albumin 3.5 -  5.2 g/dL 4.2 4.2 4.2  AST 0 - 37 U/L _0 ALT 0 - 35 U/L _1 Alk Phosphatase 39 - 117 U/L 45 45 35(L)  Total Bilirubin 0.2 - 1.2 mg/dL 0.5 0.5 0.5  Bilirubin, Direct 0.0 - 0.3 mg/dL - 0.1 -    Lab Results  Component Value Date/Time   TSH 2.52 02/18/2020 09:02 AM   TSH 2.41 01/09/2019 08:55 AM    CBC Latest Ref Rng & Units 02/18/2020 05/12/2019 01/09/2019  WBC 4.0 - 10.5 K/uL 5.8 6.1 10.3  Hemoglobin 12.0 - 15.0 g/dL 13.3 12.9 13.3  Hematocrit  36.0 - 46.0 % 40.5 41.0 39.7  Platelets 150.0 - 400.0 K/uL 236.0 279 309.0    Lab Results  Component Value Date/Time   VD25OH 36.80 02/18/2020 09:02 AM   VD25OH 39.83 12/18/2017 02:27 PM    Clinical ASCVD: No  The 10-year ASCVD risk score Mikey Bussing DC Jr., et al., 2013) is: 20.5%   Values used to calculate the score:     Age: 75 years     Sex: Female     Is Non-Hispanic African American: No     Diabetic: No     Tobacco smoker: No     Systolic Blood Pressure: 163 mmHg     Is BP treated: Yes     HDL Cholesterol: 52 mg/dL     Total Cholesterol: 188 mg/dL    Depression screen [facility] 2/9 02/18/2020 08/19/2019 06/13/2018  Decreased Interest 0 0 0  Down, Depressed, Hopeless 0 0 0  PHQ - 2 Score 0 0 0  Altered sleeping - - -  Tired, decreased energy - - -  Change in appetite - - -  Feeling bad or failure about yourself  - - -  Trouble concentrating - - -  Moving slowly or fidgety/restless - - -  Suicidal thoughts - - -  PHQ-9 Score - - -  Difficult doing work/chores - - -     Social History   Tobacco Use  Smoking Status Never  Smokeless Tobacco Never   BP Readings from Last 3 Encounters:  03/12/20 134/84  02/18/20 (!) 146/78  09/24/19 (!) 152/80   Pulse Readings from Last 3 Encounters:  03/12/20 63  02/18/20 (!) 59  09/24/19 70   Wt Readings from Last 3 Encounters:  03/12/20 135 lb 9.6 oz (61.5 kg)  02/18/20 135 lb (61.2 kg)  09/24/19 132 lb 6.4 oz (60.1 kg)   BMI Readings from Last 3 Encounters:  03/12/20 24.80 kg/m  02/18/20 24.69 kg/m  09/24/19 24.22 kg/m    Assessment/Interventions: Review of patient past medical history, allergies, medications, health status, including review of consultants reports, laboratory and other test data, was performed as part of comprehensive evaluation and provision of chronic care management services.   SDOH:  (Social Determinants of Health) assessments and interventions performed: Yes  SDOH Screenings   Alcohol Screen: Not on  file  Depression (PHQ2-9): Low Risk    PHQ-2 Score: 0  Financial Resource Strain: Low Risk    Difficulty of Paying Living Expenses: Not hard at all  Food Insecurity: Not on file  Housing: Not on file  Physical Activity: Not on file  Social Connections: Not on file  Stress: Not on file  Tobacco Use: Low Risk    Smoking Tobacco Use: Never   Smokeless Tobacco Use: Never  Transportation Needs: Not on file    CCM Care Plan  Allergies  Allergen Reactions  Lipitor [Atorvastatin]     Joint pain    Medications Reviewed Today     Reviewed by Tomasa Blase, [facility] (Pharmacist) on 11/01/20 at 1427  Med List Status: <None>   Medication Order Taking? Sig Documenting Provider Last Dose Status Informant  alendronate (FOSAMAX) 70 MG tablet [id number] Yes Take 1 tablet (70 mg total) by mouth every 7 (seven) days. Take with a full glass of water on an empty stomach. Binnie Rail, MD Taking Active   aspirin 81 MG chewable tablet [id number] Yes Chew 81 mg by mouth at bedtime. [provider] Taking Active Self  atenolol (TENORMIN) 50 MG tablet [id number] Yes Take 1 tablet (50 mg total) by mouth daily. Binnie Rail, MD Taking Active   BIOTIN PO [id number] Yes Take 5,000 mcg by mouth daily. [provider] Taking Active Self  CALCIUM-VITAMIN D PO [id number] Yes Take 1 tablet by mouth 2 (two) times daily. $RemoveBefo'1200mg'[id number]$  / 1000 units [provider] Taking Active Self  Coenzyme Q10 (CO Q 10) 10 MG CAPS [id number] Yes Take 100 mg by mouth daily. [provider] Taking Active   fenofibrate (TRICOR) 145 MG tablet [id number] Yes Take 1 tablet (145 mg total) by mouth daily. Binnie Rail, MD Taking Active   Omega-3 Fatty Acids (FISH OIL) 1000 MG CAPS [id number] Yes Take 1 capsule by mouth daily. [provider] Taking Active   rosuvastatin (CRESTOR) 5 MG tablet [id number] Yes Take 1 tablet (5 mg total) by mouth 2 (two) times a week. Binnie Rail, MD Taking Active              Patient Active Problem List   Diagnosis Date Noted   Herpes zoster without complication 51/76/1607   Nausea 09/24/2019   HNP (herniated nucleus pulposus), cervical 05/14/2019   Carotid arterial disease ([place]) 06/13/2018   Rectal bleeding 06/13/2018   Prediabetes 12/20/2017   Hair thinning 12/18/2017   Atherosclerosis of aorta ([place]) 04/29/2017   Right lower quadrant abdominal pain 04/18/2017   Fibrocystic breast disease 11/20/2015   Osteopenia, high frax 10/29/2015   HLD (hyperlipidemia) 10/28/2015   Fatty liver 10/28/2015   Vitamin D deficiency 10/28/2015   Cataract 10/28/2015   DD (diverticular disease) 06/18/2013   Hx of adenomatous colonic polyps 06/18/2013   Hypertension 05/27/2013    Immunization History  Administered Date(s) Administered   Moderna Sars-Covid-2 Vaccination 06/25/2019, 07/23/2019   PPD Test 06/08/2014, 06/10/2015   Pneumococcal Conjugate-13 08/31/2014   Pneumococcal Polysaccharide-23 05/26/2005, 05/27/2013   Tdap 05/31/2011    Conditions to be addressed/monitored:  Hypertension, Hyperlipidemia, and Osteopenia  Care Plan : CCM Care Plan  Updates made by Tomasa Blase, [place] since 11/01/2020 12:00 AM     Problem: HTN, HLD, CAD, Osteopenia   Priority: High  Onset Date: 11/01/2020     Long-Range Goal: Disease Management   Start Date: 11/01/2020  Expected End Date: 05/03/2021  This Visit's Progress: On track  Priority: High  Note:   Current Barriers:  Unable to independently monitor therapeutic efficacy  Pharmacist Clinical Goal(s):  Patient will achieve adherence to monitoring guidelines and medication adherence to achieve therapeutic efficacy achieve control of LDL  as evidenced by next lipid panel maintain control of blood pressure as evidenced by blood pressure log  through collaboration with PharmD and provider.   Interventions: 1:1 collaboration with Binnie Rail, MD regarding development and update of comprehensive plan of  care as evidenced by provider attestation and co-signature Inter-disciplinary care team collaboration (see  longitudinal plan of care) Comprehensive medication review performed; medication list updated in electronic medical record  Hypertension (BP goal <140/90) -Controlled -Current treatment: Atenolol 50mg  daily  -Medications previously tried:  n/a -Current home readings: 114/64, reports that blood pressure averaging ~110-120/ 65-80 - HR averaging 62-64 -Current dietary habits: eats sodium reduced diet, typically only drinks caffeine in AM if any -Current exercise habits: walking daily  -Reports symptoms of orthostatic hypotension on occasion - reviewed with patient to move from sitting / standing position slowly, hold on to something to provide stability, and to ensure that she is not having any symptoms before she starts walking to avoid falls  -Educated on BP goals and benefits of medications for prevention of heart attack, stroke and kidney damage; Daily salt intake goal < 2300 mg; Exercise goal of 150 minutes per week; Importance of home blood pressure monitoring; Proper BP monitoring technique; Symptoms of hypotension and importance of maintaining adequate hydration; -Counseled to monitor BP at home once weekly, document, and provide log at future appointments -Counseled on diet and exercise extensively Recommended to continue current medication  Hyperlipidemia: (LDL goal < 100) -Not ideally controlled -Last LDL - 123 mg/dL (02/18/2020) - has not had lipid panel rechecked since starting crestor  -Current treatment: Rosuvastatin 5mg  -twice weekly  Fenofibrate 145mg  daily Fish Oil 1000mg  daily  CoQ10 - 100mg  daily  Aspirin 81mg  daily  -Medications previously tried: atorvastatin - caused muscle pains , vascepa  -Current dietary patterns: trying to moderate fried / fatty food intake / reduce red meat intake  -Current exercise habits: walking daily  -Educated on Cholesterol goals;   Benefits of statin for ASCVD risk reduction; Importance of limiting foods high in cholesterol; Exercise goal of 150 minutes per week; Strategies to manage statin-induced myalgias; -Counseled on diet and exercise extensively Recommended to continue current medication - possible in future patient may be able to stop fenofibrate, recheck after next lipid panel and discuss   Osteoporosis / Osteopenia (Goal 01/18/2018) -Controlled -Last DEXA Scan: 03/02/2020   T-Score right femoral neck: -1.3  T-Score left femoral neck: -1.4  T-Score lumbar spine: -0.1  10-year probability of major osteoporotic fracture: 15.9%  10-year probability of hip fracture: 4.5% -Patient is a candidate for pharmacologic treatment due to T-Score -1.0 to -2.5 and 10-year risk of hip fracture > 3% -Current treatment  Alendronate 70mg  weekly (Started 03/2020) Calcium/vitamin D - 1200mg  / 1000 units - 1 tablet twice daily  -Medications previously tried: n/a  -Recommend [phone] units of vitamin D daily. Recommend 1200 mg of calcium daily from dietary and supplemental sources. Counseled on oral bisphosphonate administration: take in the morning, 30 minutes prior to food with 6-8 oz of water. Do not lie down for at least 30 minutes after taking. -Recommended to continue current medication   Health Maintenance -Vaccine gaps: shingles and COVID booster  -Current therapy:  Biotin -  5079mcg daily  -Educated on Cost vs benefit of each product must be carefully weighed by individual consumer -Patient is satisfied with current therapy and denies issues -Recommended to continue current medication  Patient Goals/Self-Care Activities Patient will:  - take medications as prescribed check blood pressure once weekly, document, and provide at future appointments engage in dietary modifications by reducing sodium / fried and fatty food intake   Follow Up Plan: Telephone follow up appointment with care management team member  scheduled for: 6 months  The patient has been provided with contact information for the care management team and has been advised  to call with any health related questions or concerns.  Next PCP appointment scheduled for: 11/15/2020       Medication Assistance: None required.  Patient affirms current coverage meets needs.  Compliance/Adherence/Medication fill history: Care Gaps: Shingles and COVID booster  Patient's preferred pharmacy is:  [id number] [place], [id number]: [phone]: [phone]  Uses pill box? Yes Pt endorses 100% compliance  Care Plan and Follow Up Patient Decision:  Patient agrees to Care Plan and Follow-up.  Plan: Telephone follow up appointment with care management team member scheduled for:  6 months , The patient has been provided with contact information for the care management team and has been advised to call with any health related questions or concerns. , and Next PCP appointment scheduled for: 11/15/2020  DTomasa Blase PharmD Clinical Pharmacist, [facility] [place]

## 2020-11-15 ENCOUNTER — Ambulatory Visit (INDEPENDENT_AMBULATORY_CARE_PROVIDER_SITE_OTHER): Payer: Medicare Other

## 2020-11-15 ENCOUNTER — Other Ambulatory Visit: Payer: Self-pay

## 2020-11-15 VITALS — BP 122/80 | HR 64 | Temp 98.2°F | Resp 16 | Ht 62.0 in | Wt 135.4 lb

## 2020-11-15 DIAGNOSIS — Z0000 Encounter for general adult medical examination without abnormal findings: Secondary | ICD-10-CM

## 2020-11-15 NOTE — Patient Instructions (Addendum)
Ms. Hamstra , Thank you for taking time to come for your Medicare Wellness Visit. I appreciate your ongoing commitment to your health goals. Please review the following plan we discussed and let me know if I can assist you in the future.   Screening recommendations/referrals: Colonoscopy: 07/18/2018; due every 3 years Mammogram: 05/06/2020; due every 2 years Bone Density: 03/02/2020; due every 2 years Recommended yearly ophthalmology/optometry visit for glaucoma screening and checkup Recommended yearly dental visit for hygiene and checkup  Vaccinations: Influenza vaccine: due Fall 2022 Pneumococcal vaccine: 05/27/2013, 08/31/2014 Tdap vaccine: 05/31/2011; due every 10 years Shingles vaccine: never done; Please call your insurance company to determine your out of pocket expense for the Shingrix vaccine. You may receive this vaccine at your local pharmacy. Covid-19: 06/25/2019, 07/23/2019, 05/05/2020  Advanced directives: Yes; documents on file.  Conditions/risks identified: Yes.  Goals:  Recommend to drink at least 6-8 8oz glasses of water per day.   Recommend to exercise for at least 150 minutes per week.   Recommend to remove any items from the home that may cause slips or trips.   Recommend to decrease portion sizes by eating 3 small healthy meals and at least 2 healthy snacks per day.   Recommend to begin DASH diet as directed below  Next appointment: Please schedule your next Medicare Wellness Visit with your Nurse Health Advisor in 1 year by calling [phone].   Preventive Care 75 Years and Older, Female Preventive care refers to lifestyle choices and visits with your health care provider that can promote health and wellness. What does preventive care include? A yearly physical exam. This is also called an annual well check. Dental exams once or twice a year. Routine eye exams. Ask your health care provider how often you should have your eyes checked. Personal lifestyle  choices, including: Daily care of your teeth and gums. Regular physical activity. Eating a healthy diet. Avoiding tobacco and drug use. Limiting alcohol use. Practicing safe sex. Taking low-dose aspirin every day. Taking vitamin and mineral supplements as recommended by your health care provider. What happens during an annual well check? The services and screenings done by your health care provider during your annual well check will depend on your age, overall health, lifestyle risk factors, and family history of disease. Counseling  Your health care provider may ask you questions about your: Alcohol use. Tobacco use. Drug use. Emotional well-being. Home and relationship well-being. Sexual activity. Eating habits. History of falls. Memory and ability to understand (cognition). Work and work Statistician. Reproductive health. Screening  You may have the following tests or measurements: Height, weight, and BMI. Blood pressure. Lipid and cholesterol levels. These may be checked every 5 years, or more frequently if you are over 5 years old. Skin check. Lung cancer screening. You may have this screening every year starting at age 18 if you have a 30-pack-year history of smoking and currently smoke or have quit within the past 15 years. Fecal occult blood test (FOBT) of the stool. You may have this test every year starting at age 32. Flexible sigmoidoscopy or colonoscopy. You may have a sigmoidoscopy every 5 years or a colonoscopy every 10 years starting at age 54. Hepatitis C blood test. Hepatitis B blood test. Sexually transmitted disease (STD) testing. Diabetes screening. This is done by checking your blood sugar (glucose) after you have not eaten for a while (fasting). You may have this done every 1-3 years. Bone density scan. This is done to screen for osteoporosis. You  may have this done starting at age 51. Mammogram. This may be done every 1-2 years. Talk to your health care  provider about how often you should have regular mammograms. Talk with your health care provider about your test results, treatment options, and if necessary, the need for more tests. Vaccines  Your health care provider may recommend certain vaccines, such as: Influenza vaccine. This is recommended every year. Tetanus, diphtheria, and acellular pertussis (Tdap, Td) vaccine. You may need a Td booster every 10 years. Zoster vaccine. You may need this after age 2. Pneumococcal 13-valent conjugate (PCV13) vaccine. One dose is recommended after age 46. Pneumococcal polysaccharide (PPSV23) vaccine. One dose is recommended after age 53. Talk to your health care provider about which screenings and vaccines you need and how often you need them. This information is not intended to replace advice given to you by your health care provider. Make sure you discuss any questions you have with your health care provider. Document Released: 05/21/2015 Document Revised: 01/12/2016 Document Reviewed: 02/23/2015 Elsevier Interactive Patient Education  2017 [place] Prevention in the Home Falls can cause injuries. They can happen to people of all ages. There are many things you can do to make your home safe and to help prevent falls. What can I do on the outside of my home? Regularly fix the edges of walkways and driveways and fix any cracks. Remove anything that might make you trip as you walk through a door, such as a raised step or threshold. Trim any bushes or trees on the path to your home. Use bright outdoor lighting. Clear any walking paths of anything that might make someone trip, such as rocks or tools. Regularly check to see if handrails are loose or broken. Make sure that both sides of any steps have handrails. Any raised decks and porches should have guardrails on the edges. Have any leaves, snow, or ice cleared regularly. Use sand or salt on walking paths during winter. Clean up any  spills in your garage right away. This includes oil or grease spills. What can I do in the bathroom? Use night lights. Install grab bars by the toilet and in the tub and shower. Do not use towel bars as grab bars. Use non-skid mats or decals in the tub or shower. If you need to sit down in the shower, use a plastic, non-slip stool. Keep the floor dry. Clean up any water that spills on the floor as soon as it happens. Remove soap buildup in the tub or shower regularly. Attach bath mats securely with double-sided non-slip rug tape. Do not have throw rugs and other things on the floor that can make you trip. What can I do in the bedroom? Use night lights. Make sure that you have a light by your bed that is easy to reach. Do not use any sheets or blankets that are too big for your bed. They should not hang down onto the floor. Have a firm chair that has side arms. You can use this for support while you get dressed. Do not have throw rugs and other things on the floor that can make you trip. What can I do in the kitchen? Clean up any spills right away. Avoid walking on wet floors. Keep items that you use a lot in easy-to-reach places. If you need to reach something above you, use a strong step stool that has a grab bar. Keep electrical cords out of the way. Do not use floor  polish or wax that makes floors slippery. If you must use wax, use non-skid floor wax. Do not have throw rugs and other things on the floor that can make you trip. What can I do with my stairs? Do not leave any items on the stairs. Make sure that there are handrails on both sides of the stairs and use them. Fix handrails that are broken or loose. Make sure that handrails are as long as the stairways. Check any carpeting to make sure that it is firmly attached to the stairs. Fix any carpet that is loose or worn. Avoid having throw rugs at the top or bottom of the stairs. If you do have throw rugs, attach them to the floor  with carpet tape. Make sure that you have a light switch at the top of the stairs and the bottom of the stairs. If you do not have them, ask someone to add them for you. What else can I do to help prevent falls? Wear shoes that: Do not have high heels. Have rubber bottoms. Are comfortable and fit you well. Are closed at the toe. Do not wear sandals. If you use a stepladder: Make sure that it is fully opened. Do not climb a closed stepladder. Make sure that both sides of the stepladder are locked into place. Ask someone to hold it for you, if possible. Clearly mark and make sure that you can see: Any grab bars or handrails. First and last steps. Where the edge of each step is. Use tools that help you move around (mobility aids) if they are needed. These include: Canes. Walkers. Scooters. Crutches. Turn on the lights when you go into a dark area. Replace any light bulbs as soon as they burn out. Set up your furniture so you have a clear path. Avoid moving your furniture around. If any of your floors are uneven, fix them. If there are any pets around you, be aware of where they are. Review your medicines with your doctor. Some medicines can make you feel dizzy. This can increase your chance of falling. Ask your doctor what other things that you can do to help prevent falls. This information is not intended to replace advice given to you by your health care provider. Make sure you discuss any questions you have with your health care provider. Document Released: 02/18/2009 Document Revised: 09/30/2015 Document Reviewed: 05/29/2014 Elsevier Interactive Patient Education  2017 [employer].

## 2020-11-15 NOTE — Progress Notes (Signed)
Subjective:   Morgan Hancock is a 75 y.o. female who presents for Medicare Annual (Subsequent) preventive examination.  Review of Systems     Cardiac Risk Factors include: advanced age (>66men, >22 women);dyslipidemia;family history of premature cardiovascular disease;hypertension     Objective:    Today's Vitals   11/15/20 1013 11/15/20 1026  BP:  122/80  Pulse:  64  Resp:  16  Temp:  98.2 F (36.8 C)  SpO2:  99%  Weight:  135 lb 6.4 oz (61.4 kg)  Height:  5\' 2"  (1.575 m)  PainSc: 0-No pain 0-No pain   Body mass index is 24.76 kg/m.  Advanced Directives 11/15/2020 05/12/2019 09/19/2017  Does Patient Have a Medical Advance Directive? Yes Yes Yes  Type of Advance Directive - [place];Living will [place];Living will  Does patient want to make changes to medical advance directive? No - Patient declined No - Patient declined -  Copy of [place] in Chart? - - No - copy requested    Current Medications (verified) Outpatient Encounter Medications as of 11/15/2020  Medication Sig   alendronate (FOSAMAX) 70 MG tablet Take 1 tablet (70 mg total) by mouth every 7 (seven) days. Take with a full glass of water on an empty stomach.   aspirin 81 MG chewable tablet Chew 81 mg by mouth at bedtime.   atenolol (TENORMIN) 50 MG tablet Take 1 tablet (50 mg total) by mouth daily.   BIOTIN PO Take 5,000 mcg by mouth daily.   CALCIUM-VITAMIN D PO Take 1 tablet by mouth 2 (two) times daily. 1200mg  / 1000 units   Coenzyme Q10 (CO Q 10) 10 MG CAPS Take 100 mg by mouth daily.   fenofibrate (TRICOR) 145 MG tablet Take 1 tablet (145 mg total) by mouth daily.   Omega-3 Fatty Acids (FISH OIL) 1000 MG CAPS Take 1 capsule by mouth daily.   rosuvastatin (CRESTOR) 5 MG tablet Take 1 tablet (5 mg total) by mouth 2 (two) times a week.   No facility-administered encounter medications on file as of 11/15/2020.    Allergies (verified) Lipitor  [atorvastatin]   History: Past Medical History:  Diagnosis Date   Cataract    Colon polyps 06/18/2013   Fatty liver    Hx of adenomatous colonic polyps 06/18/2013   Overview:  Had colonoscopy 06/18/2013 added new dx of colon polyps and Diverticuiosis    Hyperlipidemia    Hypertension    Osteopenia after menopause 2019   Past Surgical History:  Procedure Laterality Date   ABDOMINAL HYSTERECTOMY     prolapse   ANTERIOR CERVICAL DECOMP/DISCECTOMY FUSION N/A 05/14/2019   Procedure: ANTERIOR CERVICAL DECOMPRESSION/DISCECTOMY FUSION CERVICAL FIVE- CERVICAL SIX, CERVICAL SIX- CERVICAL SEVEN, CERVICAL SEVEN- THORACIC ONE;  Surgeon: Jovita Gamma, MD;  Location: [place];  Service: Neurosurgery;  Laterality: N/A;  ANTERIOR CERVICAL DECOMPRESSION/DISCECTOMY FUSION CERVICAL 5- CERVICAL 6, CERVICAL 6- CERVICAL 7, CERVICAL 7- THORACIC 1   CHOLECYSTECTOMY     COLONOSCOPY  06/18/2013   GALLBLADDER SURGERY  [id number]   TONSILLECTOMY  03/12/1952   TUBAL LIGATION     Family History  Problem Relation Age of Onset   Hyperlipidemia Mother    Heart disease Father    Diabetes Father    Parkinson's disease Father    Hyperlipidemia Brother    Hypertension Brother    53 / Stillbirths Maternal Grandmother    Miscarriages / Stillbirths Paternal Grandfather    Colon cancer Neg Hx  Stomach cancer Neg Hx    Colon polyps Neg Hx    Esophageal cancer Neg Hx    Rectal cancer Neg Hx    Social History   Socioeconomic History   Marital status: Married    Spouse name: Not on file   Number of children: Not on file   Years of education: Not on file   Highest education level: Not on file  Occupational History   Not on file  Tobacco Use   Smoking status: Never   Smokeless tobacco: Never  Vaping Use   Vaping Use: Never used  Substance and Sexual Activity   Alcohol use: No   Drug use: No   Sexual activity: Not Currently  Other Topics Concern   Not on file  Social History Narrative    Married   Exercise: gardening, mows lawn, [place] [facility] [employer] [facility] [place]   No alcohol tobacco or drug use   Social Determinants of Radio broadcast assistant Strain: Low Risk    Difficulty of Paying Living Expenses: Not hard at all  Food Insecurity: No Food Insecurity   Worried About Charity fundraiser in the Last Year: Never true   Arboriculturist in the Last Year: Never true  Transportation Needs: No Transportation Needs   Lack of Transportation (Medical): No   Lack of Transportation (Non-Medical): No  Physical Activity: Sufficiently Active   Days of Exercise per Week: 5 days   Minutes of Exercise per Session: 30 min  Stress: No Stress Concern Present   Feeling of Stress : Not at all  Social Connections: Socially Integrated   Frequency of Communication with Friends and Family: Three times a week   Frequency of Social Gatherings with Friends and Family: Once a week   Attends Religious Services: More than 4 times per year   Active Member of [employer] or Organizations: Yes   Attends Music therapist: More than 4 times per year   Marital Status: Married    Tobacco Counseling Counseling given: Not Answered   Clinical Intake:  Pre-visit preparation completed: Yes  Pain : No/denies pain Pain Score: 0-No pain     BMI - recorded: 24.76 Nutritional Risks: None Diabetes: No  How often do you need to have someone help you when you read instructions, pamphlets, or other written materials from your doctor or pharmacy?: 1 - Never What is the last grade level you completed in school?: Bachelor's Degree from [employer]  Diabetic? no  Interpreter Needed?: No  Information entered by :: Lisette Abu, LPN   Activities of Daily Living In your present state of health, do you have any difficulty performing the following activities: 11/15/2020  Hearing? N  Vision? N  Difficulty concentrating or making decisions? N  Walking or climbing stairs? N  Dressing or bathing? N  Doing  errands, shopping? N  Preparing Food and eating ? N  Using the Toilet? N  In the past six months, have you accidently leaked urine? N  Do you have problems with loss of bowel control? N  Managing your Medications? N  Managing your Finances? N  Housekeeping or managing your Housekeeping? N  Some recent data might be hidden    Patient Care Team: Binnie Rail, MD as PCP - General (Internal Medicine) Phylliss Blakes, OD (Optometry) [place], Darnelle Maffucci, [facility] as Pharmacist (Pharmacist) Gatha Mayer, MD as Consulting Physician (Gastroenterology)  Indicate any recent Medical Services you may have received from other than  [facility] providers in the past year (date may be approximate).     Assessment:   This is a routine wellness examination for Morgan Hancock.  Hearing/Vision screen Hearing Screening - Comments:: Patient denied any hearing difficulty. Vision Screening - Comments:: Patient wears glasses.  Eye exam done once a year by Dr. Zonia Kief.  Dietary issues and exercise activities discussed: Current Exercise Habits: Home exercise routine, Type of exercise: walking, Time (Minutes): 30, Frequency (Times/Week): 5, Weekly Exercise (Minutes/Week): 150, Intensity: Moderate, Exercise limited by: None identified   Goals Addressed               This Visit's Progress     DIET - INCREASE WATER INTAKE (pt-stated)        I would like to lose 7-8 pounds and increase my water intake.       Depression Screen PHQ 2/9 Scores 11/15/2020 02/18/2020 08/19/2019 06/13/2018 09/19/2017 10/28/2015  PHQ - 2 Score 0 0 0 0 0 0  PHQ- 9 Score - - - - 0 -    Fall Risk Fall Risk  11/15/2020 08/19/2019 06/13/2018 09/19/2017 10/28/2015  Falls in the past year? 0 1 0 No No  Number falls in past yr: 0 0 - - -  Injury with Fall? 0 0 - - -  Risk for fall due to : No Fall Risks No Fall Risks - - -  Risk for fall due to: Comment - tripped over a shoe - - -  Follow up Falls evaluation completed Falls evaluation completed;Education  provided - - -    FALL RISK PREVENTION PERTAINING TO THE HOME:  Any stairs in or around the home? No  If so, are there any without handrails? No  Home free of loose throw rugs in walkways, pet beds, electrical cords, etc? Yes  Adequate lighting in your home to reduce risk of falls? Yes   ASSISTIVE DEVICES UTILIZED TO PREVENT FALLS:  Life alert? No  Use of a cane, walker or w/c? No  Grab bars in the bathroom? No  Shower chair or bench in shower? No  Elevated toilet seat or a handicapped toilet? No   TIMED UP AND GO:  Was the test performed? Yes .  Length of time to ambulate 10 feet: 6 sec.   Gait steady and fast without use of assistive device  Cognitive Function: MMSE - Mini Mental State Exam 09/19/2017  Orientation to time 5  Orientation to Place 5  Registration 3  Attention/ Calculation 5  Recall 3  Language- name 2 objects 2  Language- repeat 1  Language- follow 3 step command 3  Language- read & follow direction 1  Write a sentence 1  Copy design 1  Total score 30     6CIT Screen 08/19/2019  What Year? 0 points  What month? 0 points  What time? 0 points  Count back from 20 0 points  Months in reverse 0 points  Repeat phrase 0 points  Total Score 0    Immunizations Immunization History  Administered Date(s) Administered   Moderna Sars-Covid-2 Vaccination 06/25/2019, 07/23/2019, 05/05/2020   PPD Test 06/08/2014, 06/10/2015   Pneumococcal Conjugate-13 08/31/2014   Pneumococcal Polysaccharide-23 05/26/2005, 05/27/2013   Tdap 05/31/2011    TDAP status: Up to date  Flu Vaccine status: Declined, Education has been provided regarding the importance of this vaccine but patient still declined. Advised may receive this vaccine at local pharmacy or Health Dept. Aware to provide a copy of the vaccination record if obtained from  local pharmacy or Health Dept. Verbalized acceptance and understanding.  Pneumococcal vaccine status: Up to date  Covid-19 vaccine  status: Completed vaccines  Qualifies for Shingles Vaccine? Yes   Zostavax completed No   Shingrix Completed?: No.    Education has been provided regarding the importance of this vaccine. Patient has been advised to call insurance company to determine out of pocket expense if they have not yet received this vaccine. Advised may also receive vaccine at local pharmacy or Health Dept. Verbalized acceptance and understanding.  Screening Tests Health Maintenance  Topic Date Due   Zoster Vaccines- Shingrix (1 of 2) Never done   COVID-19 Vaccine (4 - Booster for Moderna series) 09/03/2020   INFLUENZA VACCINE  12/06/2020   TETANUS/TDAP  05/30/2021   COLONOSCOPY (Pts 45-63yrs Insurance coverage will need to be confirmed)  07/17/2021   DEXA SCAN  03/02/2022   MAMMOGRAM  05/06/2022   Hepatitis C Screening  Completed   PNA vac Low Risk Adult  Completed   HPV VACCINES  Aged Out    Health Maintenance  Health Maintenance Due  Topic Date Due   Zoster Vaccines- Shingrix (1 of 2) Never done   COVID-19 Vaccine (4 - Booster for Moderna series) 09/03/2020    Colorectal cancer screening: Type of screening: Colonoscopy. Completed 07/18/2018. Repeat every 3 years  Mammogram status: Completed 05/06/2020. Repeat every year  Bone Density status: Completed 03/02/2020. Results reflect: Bone density results: OSTEOPENIA. Repeat every 2 years.  Lung Cancer Screening: (Low Dose CT Chest recommended if Age 24-80 years, 30 pack-year currently smoking OR have quit w/in 15years.) does not qualify.   Lung Cancer Screening Referral: no  Additional Screening:  Hepatitis C Screening: does qualify; Completed yes  Vision Screening: Recommended annual ophthalmology exams for early detection of glaucoma and other disorders of the eye. Is the patient up to date with their annual eye exam?  Yes  Who is the provider or what is the name of the office in which the patient attends annual eye exams? Zonia Kief, OD. If pt is  not established with a provider, would they like to be referred to a provider to establish care? No .   Dental Screening: Recommended annual dental exams for proper oral hygiene  Community Resource Referral / Chronic Care Management: CRR required this visit?  No   CCM required this visit?  No      Plan:     I have personally reviewed and noted the following in the patient's chart:   Medical and social history Use of alcohol, tobacco or illicit drugs  Current medications and supplements including opioid prescriptions.  Functional ability and status Nutritional status Physical activity Advanced directives List of other physicians Hospitalizations, surgeries, and ER visits in previous 12 months Vitals Screenings to include cognitive, depression, and falls Referrals and appointments  In addition, I have reviewed and discussed with patient certain preventive protocols, quality metrics, and best practice recommendations. A written personalized care plan for preventive services as well as general preventive health recommendations were provided to patient.     Sheral Flow, LPN   12/15/9831   Nurse Notes: n/a

## 2021-01-11 ENCOUNTER — Telehealth: Payer: Self-pay

## 2021-01-11 NOTE — Chronic Care Management (AMB) (Signed)
Chronic Care Management Pharmacy Assistant   Name: Morgan Hancock  MRN: [id number] DOB: Jun 17, 1945   Reason for Encounter: Disease State Hypertension     Recent office visits:  None noted  Recent consult visits:  None noted  Hospital visits:  None in previous 6 months  Medications: Outpatient Encounter Medications as of 01/11/2021  Medication Sig   alendronate (FOSAMAX) 70 MG tablet Take 1 tablet (70 mg total) by mouth every 7 (seven) days. Take with a full glass of water on an empty stomach.   aspirin 81 MG chewable tablet Chew 81 mg by mouth at bedtime.   atenolol (TENORMIN) 50 MG tablet Take 1 tablet (50 mg total) by mouth daily.   BIOTIN PO Take 5,000 mcg by mouth daily.   CALCIUM-VITAMIN D PO Take 1 tablet by mouth 2 (two) times daily. '1200mg'$  / 1000 units   Coenzyme Q10 (CO Q 10) 10 MG CAPS Take 100 mg by mouth daily.   fenofibrate (TRICOR) 145 MG tablet Take 1 tablet (145 mg total) by mouth daily.   Omega-3 Fatty Acids (FISH OIL) 1000 MG CAPS Take 1 capsule by mouth daily.   rosuvastatin (CRESTOR) 5 MG tablet Take 1 tablet (5 mg total) by mouth 2 (two) times a week.   No facility-administered encounter medications on file as of 01/11/2021.   Reviewed chart prior to disease state call. Spoke with patient regarding BP  Recent Office Vitals: BP Readings from Last 3 Encounters:  11/15/20 122/80  03/12/20 134/84  02/18/20 (!) 146/78   Pulse Readings from Last 3 Encounters:  11/15/20 64  03/12/20 63  02/18/20 (!) 59    Wt Readings from Last 3 Encounters:  11/15/20 135 lb 6.4 oz (61.4 kg)  03/12/20 135 lb 9.6 oz (61.5 kg)  02/18/20 135 lb (61.2 kg)     Kidney Function Lab Results  Component Value Date/Time   CREATININE 1.03 02/18/2020 09:02 AM   CREATININE 0.89 05/12/2019 01:49 PM   GFR 53.41 (L) 02/18/2020 09:02 AM   GFRNONAA >60 05/12/2019 01:49 PM   GFRAA >60 05/12/2019 01:49 PM    BMP Latest Ref Rng & Units 02/18/2020 05/12/2019 01/09/2019  Glucose 70 -  99 mg/dL 90 92 98  BUN 6 - 23 mg/dL 21 17 40(H)  Creatinine 0.40 - 1.20 mg/dL 1.03 0.89 1.07  Sodium 135 - 145 mEq/L 142 142 140  Potassium 3.5 - 5.1 mEq/L 4.0 4.1 4.6  Chloride 96 - 112 mEq/L 104 106 104  CO2 19 - 32 mEq/L '29 27 28  '$ Calcium 8.4 - 10.5 mg/dL 9.8 10.6(H) 10.0    Current antihypertensive regimen:  Atenolol '50mg'$  daily  How often are you checking your Blood Pressure? infrequently Current home BP readings: Patient states that she typically writes her b/p down but has not checked her blood pressure in a couple of weeks.  What recent interventions/DTPs have been made by any provider to improve Blood Pressure control since last CPP Visit: N/a Any recent hospitalizations or ED visits since last visit with CPP? No What diet changes have been made to improve Blood Pressure Control?  Patient states that she eats 2 times daily and a typical meal for her consists of 1 protein and 2 vegetables.  What exercise is being done to improve your Blood Pressure Control?  Patient states that she takes her dog for short walks 3 times daily, Mows the yard occasionally, and gardens.   Adherence Review: Is the patient currently on ACE/ARB medication? No  Does the patient have >5 day gap between last estimated fill dates? No  Misc. Comment: [name] is a very pleasant woman. She states that she is having some aching in her thumbs since taking her Rosuvastatin. She is unsure if it is due to her arthritis or if it is due to changing her medication. Overall patient states that she is doing well on her other medications and appreciates the call. [name] states that she would like to wait until after her PCP appt. in October to schedule an appt with the pharmacist.    Star Rating Drugs: Rosuvastatin 5 mg - Last filled 11/13/20 90 DS   Andee Poles, CMA

## 2021-02-05 ENCOUNTER — Other Ambulatory Visit: Payer: Self-pay | Admitting: Internal Medicine

## 2021-02-11 ENCOUNTER — Other Ambulatory Visit: Payer: Self-pay | Admitting: Internal Medicine

## 2021-02-20 ENCOUNTER — Encounter: Payer: Self-pay | Admitting: Internal Medicine

## 2021-02-20 NOTE — Progress Notes (Signed)
Subjective:    Patient ID: Morgan Hancock, female    DOB: [birth year], 75 y.o.   MRN: [id number]   This visit occurred during the SARS-CoV-2 public health emergency.  Safety protocols were in place, including screening questions prior to the visit, additional usage of staff PPE, and extensive cleaning of exam room while observing appropriate contact time as indicated for disinfecting solutions.    HPI She is here for a physical exam.   Her husband states she falls asleep too easily at night when watching tv  - around 10: 30 pm.  She sleeps good at night. She wakes up around 6:30 - 7 am. She does not feels tired during the day.   She feels lightheaded all the time.  She needs to drink more, but no pattern to when she has it.  Not worse with standing.    She has pain up the left anterior side of her neck - it comes and goes.  It will last < 5 min.  No pattern to when it comes. She noticed this the past couple of months.    No issues with lightheadedness or neck pain when walking.     Medications and allergies reviewed with patient and updated if appropriate.  Patient Active Problem List   Diagnosis Date Noted   Herpes zoster without complication 60/73/7106   Nausea 09/24/2019   HNP (herniated nucleus pulposus), cervical 05/14/2019   Carotid arterial disease ([place]) 06/13/2018   Rectal bleeding 06/13/2018   Prediabetes 12/20/2017   Hair thinning 12/18/2017   Atherosclerosis of aorta ([place]) 04/29/2017   Right lower quadrant abdominal pain 04/18/2017   Fibrocystic breast disease 11/20/2015   Osteopenia, high frax 10/29/2015   HLD (hyperlipidemia) 10/28/2015   Fatty liver 10/28/2015   Vitamin D deficiency 10/28/2015   Cataract 10/28/2015   DD (diverticular disease) 06/18/2013   Hx of adenomatous colonic polyps 06/18/2013   Hypertension 05/27/2013    Current Outpatient Medications on File Prior to Visit  Medication Sig Dispense Refill   alendronate (FOSAMAX) 70 MG tablet TAKE  1 TABLET BY MOUTH EVERY 7 (SEVEN) DAYS. TAKE WITH A FULL GLASS OF WATER ON AN EMPTY STOMACH. 12 tablet 3   aspirin 81 MG chewable tablet Chew 81 mg by mouth at bedtime.     atenolol (TENORMIN) 50 MG tablet TAKE 1 TABLET BY MOUTH EVERY DAY 90 tablet 3   BIOTIN PO Take 5,000 mcg by mouth daily.     CALCIUM-VITAMIN D PO Take 1 tablet by mouth 2 (two) times daily. 1200mg  / 1000 units     Coenzyme Q10 (CO Q 10) 10 MG CAPS Take 100 mg by mouth daily.     fenofibrate (TRICOR) 145 MG tablet Take 1 tablet (145 mg total) by mouth daily. 90 tablet 3   Omega-3 Fatty Acids (FISH OIL) 1000 MG CAPS Take 1 capsule by mouth daily.     rosuvastatin (CRESTOR) 5 MG tablet TAKE 1 TABLET (5 MG TOTAL) BY MOUTH 2 (TWO) TIMES A WEEK. 27 tablet 3   No current facility-administered medications on file prior to visit.    Past Medical History:  Diagnosis Date   Cataract    Colon polyps 06/18/2013   Fatty liver    Hx of adenomatous colonic polyps 06/18/2013   Overview:  Had colonoscopy 06/18/2013 added new dx of colon polyps and Diverticuiosis    Hyperlipidemia    Hypertension    Osteopenia after menopause 2019    Past Surgical History:  Procedure Laterality Date   ABDOMINAL HYSTERECTOMY     prolapse   ANTERIOR CERVICAL DECOMP/DISCECTOMY FUSION N/A 05/14/2019   Procedure: ANTERIOR CERVICAL DECOMPRESSION/DISCECTOMY FUSION CERVICAL FIVE- CERVICAL SIX, CERVICAL SIX- CERVICAL SEVEN, CERVICAL SEVEN- THORACIC ONE;  Surgeon: Jovita Gamma, MD;  Location: [place];  Service: Neurosurgery;  Laterality: N/A;  ANTERIOR CERVICAL DECOMPRESSION/DISCECTOMY FUSION CERVICAL 5- CERVICAL 6, CERVICAL 6- CERVICAL 7, CERVICAL 7- THORACIC 1   CHOLECYSTECTOMY     COLONOSCOPY  06/18/2013   GALLBLADDER SURGERY  [id number]   TONSILLECTOMY  03/12/1952   TUBAL LIGATION      Social History   Socioeconomic History   Marital status: Married    Spouse name: Not on file   Number of children: Not on file   Years of education: Not on file    Highest education level: Not on file  Occupational History   Not on file  Tobacco Use   Smoking status: Never   Smokeless tobacco: Never  Vaping Use   Vaping Use: Never used  Substance and Sexual Activity   Alcohol use: No   Drug use: No   Sexual activity: Not Currently  Other Topics Concern   Not on file  Social History Narrative   Married   Exercise: gardening, mows lawn, [place] [facility] [employer] [facility] [place]   No alcohol tobacco or drug use   Social Determinants of Radio broadcast assistant Strain: Low Risk    Difficulty of Paying Living Expenses: Not hard at all  Food Insecurity: No Food Insecurity   Worried About Charity fundraiser in the Last Year: Never true   Arboriculturist in the Last Year: Never true  Transportation Needs: No Transportation Needs   Lack of Transportation (Medical): No   Lack of Transportation (Non-Medical): No  Physical Activity: Sufficiently Active   Days of Exercise per Week: 5 days   Minutes of Exercise per Session: 30 min  Stress: No Stress Concern Present   Feeling of Stress : Not at all  Social Connections: Socially Integrated   Frequency of Communication with Friends and Family: Three times a week   Frequency of Social Gatherings with Friends and Family: Once a week   Attends Religious Services: More than 4 times per year   Active Member of [employer] or Organizations: Yes   Attends Music therapist: More than 4 times per year   Marital Status: Married    Family History  Problem Relation Age of Onset   Hyperlipidemia Mother    Heart disease Father    Diabetes Father    Parkinson's disease Father    Hyperlipidemia Brother    Hypertension Brother    Miscarriages / Stillbirths Maternal Grandmother    Miscarriages / Stillbirths Paternal Grandfather    Colon cancer Neg Hx    Stomach cancer Neg Hx    Colon polyps Neg Hx    Esophageal cancer Neg Hx    Rectal cancer Neg Hx     Review of Systems  Constitutional:  Negative for  chills and fever.  HENT:  Negative for congestion, sinus pain, sore throat and trouble swallowing.   Eyes:  Negative for visual disturbance.  Respiratory:  Negative for cough, shortness of breath and wheezing.   Cardiovascular:  Negative for chest pain, palpitations and leg swelling.  Gastrointestinal:  Negative for abdominal pain, blood in stool, constipation, diarrhea and nausea.       No  gerd  Genitourinary:  Positive for urgency. Negative for  dysuria and frequency.  Musculoskeletal:  Positive for arthralgias (arthritis). Negative for back pain.  Skin:  Negative for color change and rash.  Neurological:  Positive for light-headedness. Negative for dizziness and headaches.  Psychiatric/Behavioral:  Negative for dysphoric mood. The patient is not nervous/anxious.       Objective:   Vitals:   02/21/21 0828  BP: 128/70  Pulse: 61  Temp: 98.5 F (36.9 C)  SpO2: 95%   Filed Weights   02/21/21 0828  Weight: 137 lb (62.1 kg)   Body mass index is 25.06 kg/m.  BP Readings from Last 3 Encounters:  02/21/21 128/70  11/15/20 122/80  03/12/20 134/84    Wt Readings from Last 3 Encounters:  02/21/21 137 lb (62.1 kg)  11/15/20 135 lb 6.4 oz (61.4 kg)  03/12/20 135 lb 9.6 oz (61.5 kg)     Physical Exam Constitutional: She appears well-developed and well-nourished. No distress.  HENT:  Head: Normocephalic and atraumatic.  Right Ear: External ear normal. Normal ear canal and TM Left Ear: External ear normal.  Normal ear canal and TM Mouth/Throat: Oropharynx is clear and moist.  Eyes: Conjunctivae and EOM are normal.  Neck: Neck supple. No tracheal deviation present. No thyromegaly present.  No carotid bruit  Cardiovascular: Normal rate, regular rhythm and normal heart sounds.   No murmur heard.  No edema. Pulmonary/Chest: Effort normal and breath sounds normal. No respiratory distress. She has no wheezes. She has no rales.  Breast: deferred   Abdominal: Soft. She exhibits no  distension. There is no tenderness.  Lymphadenopathy: She has no cervical adenopathy.  Skin: Skin is warm and dry. She is not diaphoretic.  Psychiatric: She has a normal mood and affect. Her behavior is normal.     Lab Results  Component Value Date   WBC 5.8 02/18/2020   HGB 13.3 02/18/2020   HCT 40.5 02/18/2020   PLT 236.0 02/18/2020   GLUCOSE 90 02/18/2020   CHOL 188 02/18/2020   TRIG 63.0 02/18/2020   HDL 52.00 02/18/2020   LDLCALC 123 (H) 02/18/2020   ALT 22 02/18/2020   AST 28 02/18/2020   NA 142 02/18/2020   K 4.0 02/18/2020   CL 104 02/18/2020   CREATININE 1.03 02/18/2020   BUN 21 02/18/2020   CO2 29 02/18/2020   TSH 2.52 02/18/2020   HGBA1C 6.3 02/18/2020         Assessment & Plan:   Physical exam: Screening blood work  ordered Exercise   walking dog Weight   normal Substance abuse  none   Reviewed recommended immunizations.   Health Maintenance  Topic Date Due   COVID-19 Vaccine (4 - Booster for Moderna series) 03/09/2021 (Originally 09/03/2020)   INFLUENZA VACCINE  08/05/2021 (Originally 12/06/2020)   TETANUS/TDAP  05/30/2021   COLONOSCOPY (Pts 45-45yrs Insurance coverage will need to be confirmed)  07/17/2021   DEXA SCAN  03/02/2022   Hepatitis C Screening  Completed   HPV VACCINES  Aged Out   Zoster Vaccines- Shingrix  Discontinued          See Problem List for Assessment and Plan of chronic medical problems.

## 2021-02-20 NOTE — Patient Instructions (Addendum)
Blood work was ordered.     Medications changes include :   none  Your prescription(s) have been submitted to your pharmacy. Please take as directed and contact our office if you believe you are having problem(s) with the medication(s).    Please followup in 6 months    Health Maintenance, Female Adopting a healthy lifestyle and getting preventive care are important in promoting health and wellness. Ask your health care provider about: The right schedule for you to have regular tests and exams. Things you can do on your own to prevent diseases and keep yourself healthy. What should I know about diet, weight, and exercise? Eat a healthy diet  Eat a diet that includes plenty of vegetables, fruits, low-fat dairy products, and lean protein. Do not eat a lot of foods that are high in solid fats, added sugars, or sodium. Maintain a healthy weight Body mass index (BMI) is used to identify weight problems. It estimates body fat based on height and weight. Your health care provider can help determine your BMI and help you achieve or maintain a healthy weight. Get regular exercise Get regular exercise. This is one of the most important things you can do for your health. Most adults should: Exercise for at least 150 minutes each week. The exercise should increase your heart rate and make you sweat (moderate-intensity exercise). Do strengthening exercises at least twice a week. This is in addition to the moderate-intensity exercise. Spend less time sitting. Even light physical activity can be beneficial. Watch cholesterol and blood lipids Have your blood tested for lipids and cholesterol at 75 years of age, then have this test every 5 years. Have your cholesterol levels checked more often if: Your lipid or cholesterol levels are high. You are older than 75 years of age. You are at high risk for heart disease. What should I know about cancer screening? Depending on your health history and  family history, you may need to have cancer screening at various ages. This may include screening for: Breast cancer. Cervical cancer. Colorectal cancer. Skin cancer. Lung cancer. What should I know about heart disease, diabetes, and high blood pressure? Blood pressure and heart disease High blood pressure causes heart disease and increases the risk of stroke. This is more likely to develop in people who have high blood pressure readings, are of African descent, or are overweight. Have your blood pressure checked: Every 3-5 years if you are 59-76 years of age. Every year if you are 81 years old or older. Diabetes Have regular diabetes screenings. This checks your fasting blood sugar level. Have the screening done: Once every three years after age 58 if you are at a normal weight and have a low risk for diabetes. More often and at a younger age if you are overweight or have a high risk for diabetes. What should I know about preventing infection? Hepatitis B If you have a higher risk for hepatitis B, you should be screened for this virus. Talk with your health care provider to find out if you are at risk for hepatitis B infection. Hepatitis C Testing is recommended for: Everyone born from [birth year] through 1965. Anyone with known risk factors for hepatitis C. Sexually transmitted infections (STIs) Get screened for STIs, including gonorrhea and chlamydia, if: You are sexually active and are younger than 75 years of age. You are older than 75 years of age and your health care provider tells you that you are at risk for this type of infection.  Your sexual activity has changed since you were last screened, and you are at increased risk for chlamydia or gonorrhea. Ask your health care provider if you are at risk. Ask your health care provider about whether you are at high risk for HIV. Your health care provider may recommend a prescription medicine to help prevent HIV infection. If you choose to take  medicine to prevent HIV, you should first get tested for HIV. You should then be tested every 3 months for as long as you are taking the medicine. Pregnancy If you are about to stop having your period (premenopausal) and you may become pregnant, seek counseling before you get pregnant. Take 400 to 800 micrograms (mcg) of folic acid every day if you become pregnant. Ask for birth control (contraception) if you want to prevent pregnancy. Osteoporosis and menopause Osteoporosis is a disease in which the bones lose minerals and strength with aging. This can result in bone fractures. If you are 75 years old or older, or if you are at risk for osteoporosis and fractures, ask your health care provider if you should: Be screened for bone loss. Take a calcium or vitamin D supplement to lower your risk of fractures. Be given hormone replacement therapy (HRT) to treat symptoms of menopause. Follow these instructions at home: Lifestyle Do not use any products that contain nicotine or tobacco, such as cigarettes, e-cigarettes, and chewing tobacco. If you need help quitting, ask your health care provider. Do not use street drugs. Do not share needles. Ask your health care provider for help if you need support or information about quitting drugs. Alcohol use Do not drink alcohol if: Your health care provider tells you not to drink. You are pregnant, may be pregnant, or are planning to become pregnant. If you drink alcohol: Limit how much you use to 0-1 drink a day. Limit intake if you are breastfeeding. Be aware of how much alcohol is in your drink. In the [place] one drink equals one 12 oz bottle of beer (355 mL), one 5 oz glass of wine (148 mL), or one 1 oz glass of hard liquor (44 mL). General instructions Schedule regular health, dental, and eye exams. Stay current with your vaccines. Tell your health care provider if: You often feel depressed. You have ever been abused or do not feel safe at  home. Summary Adopting a healthy lifestyle and getting preventive care are important in promoting health and wellness. Follow your health care provider's instructions about healthy diet, exercising, and getting tested or screened for diseases. Follow your health care provider's instructions on monitoring your cholesterol and blood pressure. This information is not intended to replace advice given to you by your health care provider. Make sure you discuss any questions you have with your health care provider. Document Revised: 07/02/2020 Document Reviewed: 04/17/2018 Elsevier Patient Education  2022 [employer].

## 2021-02-21 ENCOUNTER — Other Ambulatory Visit: Payer: Self-pay

## 2021-02-21 ENCOUNTER — Ambulatory Visit (INDEPENDENT_AMBULATORY_CARE_PROVIDER_SITE_OTHER): Payer: Medicare Other | Admitting: Internal Medicine

## 2021-02-21 VITALS — BP 128/70 | HR 61 | Temp 98.5°F | Ht 62.0 in | Wt 137.0 lb

## 2021-02-21 DIAGNOSIS — E559 Vitamin D deficiency, unspecified: Secondary | ICD-10-CM

## 2021-02-21 DIAGNOSIS — M85851 Other specified disorders of bone density and structure, right thigh: Secondary | ICD-10-CM

## 2021-02-21 DIAGNOSIS — I6523 Occlusion and stenosis of bilateral carotid arteries: Secondary | ICD-10-CM

## 2021-02-21 DIAGNOSIS — R7303 Prediabetes: Secondary | ICD-10-CM | POA: Diagnosis not present

## 2021-02-21 DIAGNOSIS — Z0000 Encounter for general adult medical examination without abnormal findings: Secondary | ICD-10-CM

## 2021-02-21 DIAGNOSIS — I10 Essential (primary) hypertension: Secondary | ICD-10-CM

## 2021-02-21 DIAGNOSIS — K760 Fatty (change of) liver, not elsewhere classified: Secondary | ICD-10-CM | POA: Diagnosis not present

## 2021-02-21 DIAGNOSIS — E782 Mixed hyperlipidemia: Secondary | ICD-10-CM

## 2021-02-21 DIAGNOSIS — M542 Cervicalgia: Secondary | ICD-10-CM | POA: Insufficient documentation

## 2021-02-21 DIAGNOSIS — M85852 Other specified disorders of bone density and structure, left thigh: Secondary | ICD-10-CM | POA: Diagnosis not present

## 2021-02-21 DIAGNOSIS — R42 Dizziness and giddiness: Secondary | ICD-10-CM

## 2021-02-21 DIAGNOSIS — I700 Atherosclerosis of aorta: Secondary | ICD-10-CM | POA: Diagnosis not present

## 2021-02-21 HISTORY — DX: Dizziness and giddiness: R42

## 2021-02-21 LAB — LIPID PANEL
Cholesterol: 168 mg/dL (ref 0–200)
HDL: 52 mg/dL (ref >39.00)
LDL Cholesterol: 103 mg/dL — ABNORMAL HIGH (ref 0–99)
NonHDL: 116.05
Total CHOL/HDL Ratio: 3
Triglycerides: 64 mg/dL (ref 0.0–149.0)
VLDL: 12.8 mg/dL (ref 0.0–40.0)

## 2021-02-21 LAB — CBC WITH DIFFERENTIAL/PLATELET
Basophils Absolute: 0.1 10*3/uL (ref 0.0–0.1)
Basophils Relative: 1 % (ref 0.0–3.0)
Eosinophils Absolute: 0.3 10*3/uL (ref 0.0–0.7)
Eosinophils Relative: 5.8 % — ABNORMAL HIGH (ref 0.0–5.0)
HCT: 40.5 % (ref 36.0–46.0)
Hemoglobin: 13.4 g/dL (ref 12.0–15.0)
Lymphocytes Relative: 27.8 % (ref 12.0–46.0)
Lymphs Abs: 1.5 10*3/uL (ref 0.7–4.0)
MCHC: 33.2 g/dL (ref 30.0–36.0)
MCV: 90.7 fl (ref 78.0–100.0)
Monocytes Absolute: 0.4 10*3/uL (ref 0.1–1.0)
Monocytes Relative: 6.7 % (ref 3.0–12.0)
Neutro Abs: 3.2 10*3/uL (ref 1.4–7.7)
Neutrophils Relative %: 58.7 % (ref 43.0–77.0)
Platelets: 221 10*3/uL (ref 150.0–400.0)
RBC: 4.47 Mil/uL (ref 3.87–5.11)
RDW: 14.6 % (ref 11.5–15.5)
WBC: 5.5 10*3/uL (ref 4.0–10.5)

## 2021-02-21 LAB — COMPREHENSIVE METABOLIC PANEL
ALT: 25 U/L (ref 0–35)
AST: 36 U/L (ref 0–37)
Albumin: 4.4 g/dL (ref 3.5–5.2)
Alkaline Phosphatase: 32 U/L — ABNORMAL LOW (ref 39–117)
BUN: 20 mg/dL (ref 6–23)
CO2: 28 mEq/L (ref 19–32)
Calcium: 9.7 mg/dL (ref 8.4–10.5)
Chloride: 106 mEq/L (ref 96–112)
Creatinine, Ser: 1.01 mg/dL (ref 0.40–1.20)
GFR: 54.55 mL/min — ABNORMAL LOW (ref >60.00)
Glucose, Bld: 99 mg/dL (ref 70–99)
Potassium: 4.3 mEq/L (ref 3.5–5.1)
Sodium: 142 mEq/L (ref 135–145)
Total Bilirubin: 0.6 mg/dL (ref 0.2–1.2)
Total Protein: 7.3 g/dL (ref 6.0–8.3)

## 2021-02-21 LAB — HEMOGLOBIN A1C: Hgb A1c MFr Bld: 6.4 % (ref 4.6–6.5)

## 2021-02-21 LAB — TSH: TSH: 2.46 u[IU]/mL (ref 0.35–5.50)

## 2021-02-21 MED ORDER — FENOFIBRATE 145 MG PO TABS: 145.0000 mg | ORAL_TABLET | Freq: Every day | ORAL | 3 refills | Status: DC

## 2021-02-21 NOTE — Assessment & Plan Note (Signed)
Chronic CMP Continue regular walking Weight is acceptable

## 2021-02-21 NOTE — Assessment & Plan Note (Addendum)
Chronic DEXA up-to-date Continue Fosamax 70 mg weekly Continue regular walking Continue calcium and vitamin D

## 2021-02-21 NOTE — Assessment & Plan Note (Signed)
New She has been experiencing intermittent lightheadedness but no obvious pattern to when this occurs.  It is not necessarily when she first stands up and is likely not orthostatic in nature Does not seem to be related to when she eats or does not eat She knows she does not drink enough fluids-?  Related to dehydration-she will increase her fluids Routine blood work today She will monitor her symptoms

## 2021-02-21 NOTE — Assessment & Plan Note (Signed)
Chronic Blood pressure well controlled CMP Continue atenolol 50 mg daily 

## 2021-02-21 NOTE — Assessment & Plan Note (Signed)
Acute Intermittent pain in anterior left neck - no pattern to when this occurs She will monitor for now and if this continues we can refer to ENT

## 2021-02-21 NOTE — Assessment & Plan Note (Signed)
Chronic Regular exercise and healthy diet encouraged Check lipid panel  Continue rosuvastatin 5 mg twice weekly, Tricor 145 mg daily

## 2021-02-21 NOTE — Assessment & Plan Note (Signed)
Chronic Mild stenosis 2020-we will repeat in the next few years Continue rosuvastatin 5 mg twice weekly Lipid panel

## 2021-02-21 NOTE — Assessment & Plan Note (Signed)
Chronic Taking vitamin D daily 

## 2021-02-21 NOTE — Assessment & Plan Note (Signed)
Chronic Check a1c Low sugar / carb diet Stressed regular exercise  

## 2021-02-21 NOTE — Assessment & Plan Note (Signed)
Chronic Continue rosuvastatin 5 mg twice weekly Check lipid panel Continue regular walking

## 2021-03-15 ENCOUNTER — Telehealth: Payer: Self-pay

## 2021-03-15 NOTE — Progress Notes (Signed)
Chronic Care Management Pharmacy Assistant   Name: Morgan Hancock  MRN: [id number] DOB: 1946-02-15  Reason for Encounter: Disease State - Hypertension Appointment: Telephone 04/18/21 @ [facility]   Recent office visits:  02/21/21 Quay Burow (PCP) - Annual Exam. No med changes.  Recent consult visits:  None listed  Hospital visits:  None in previous 6 months  Medications: Outpatient Encounter Medications as of 03/15/2021  Medication Sig   alendronate (FOSAMAX) 70 MG tablet TAKE 1 TABLET BY MOUTH EVERY 7 (SEVEN) DAYS. TAKE WITH A FULL GLASS OF WATER ON AN EMPTY STOMACH.   aspirin 81 MG chewable tablet Chew 81 mg by mouth at bedtime.   atenolol (TENORMIN) 50 MG tablet TAKE 1 TABLET BY MOUTH EVERY DAY   BIOTIN PO Take 5,000 mcg by mouth daily.   CALCIUM-VITAMIN D PO Take 1 tablet by mouth 2 (two) times daily. 1200mg  / 1000 units   Coenzyme Q10 (CO Q 10) 10 MG CAPS Take 100 mg by mouth daily.   fenofibrate (TRICOR) 145 MG tablet Take 1 tablet (145 mg total) by mouth daily.   Omega-3 Fatty Acids (FISH OIL) 1000 MG CAPS Take 1 capsule by mouth daily.   rosuvastatin (CRESTOR) 5 MG tablet TAKE 1 TABLET (5 MG TOTAL) BY MOUTH 2 (TWO) TIMES A WEEK.   No facility-administered encounter medications on file as of 03/15/2021.   Reviewed chart prior to disease state call. Spoke with patient regarding BP  Recent Office Vitals: BP Readings from Last 3 Encounters:  02/21/21 128/70  11/15/20 122/80  03/12/20 134/84   Pulse Readings from Last 3 Encounters:  02/21/21 61  11/15/20 64  03/12/20 63    Wt Readings from Last 3 Encounters:  02/21/21 137 lb (62.1 kg)  11/15/20 135 lb 6.4 oz (61.4 kg)  03/12/20 135 lb 9.6 oz (61.5 kg)     Kidney Function Lab Results  Component Value Date/Time   CREATININE 1.01 02/21/2021 09:13 AM   CREATININE 1.03 02/18/2020 09:02 AM   GFR 54.55 (L) 02/21/2021 09:13 AM   GFRNONAA >60 05/12/2019 01:49 PM   GFRAA >60 05/12/2019 01:49 PM    BMP Latest Ref Rng & Units  02/21/2021 02/18/2020 05/12/2019  Glucose 70 - 99 mg/dL 99 90 92  BUN 6 - 23 mg/dL 20 21 17   Creatinine 0.40 - 1.20 mg/dL 1.01 1.03 0.89  Sodium 135 - 145 mEq/L 142 142 142  Potassium 3.5 - 5.1 mEq/L 4.3 4.0 4.1  Chloride 96 - 112 mEq/L 106 104 106  CO2 19 - 32 mEq/L 28 29 27   Calcium 8.4 - 10.5 mg/dL 9.7 9.8 10.6(H)    Current antihypertensive regimen:   - Atenolol 50mg  daily   How often are you checking your Blood Pressure?  3-5x per week  Current home BP readings:   Patient states her readings run around 735-329 systolic - 92-42 diastolic.  What recent interventions/DTPs have been made by any provider to improve Blood Pressure control since last CPP Visit:   None noted  Any recent hospitalizations or ED visits since last visit with CPP? Yes Patient states she went to [facility],last Friday due to an UTI, she's currently taking antibiotics Cephalexin 500 mg, x7 days.  What diet changes have been made to improve Blood Pressure Control?  Patient states no recent diet changes.  What exercise is being done to improve your Blood Pressure Control?  Patient states no exercising at this time, her husband had a knee replacement and she has been  his caretaker.  Adherence Review: Is the patient currently on ACE/ARB medication? No Does the patient have >5 day gap between last estimated fill dates? No   Care Gaps Colonoscopy - 07/18/2018 Diabetic Foot Exam - Mammogram - 05/06/20 Ophthalmology - NA Dexa Scan - 03/02/2020 Annual Well Visit - 02/21/2021 Micro albumin - NA Hemoglobin A1c - 02/21/21  Star Rating Drugs: Rosuvastatin - 02/11/21 90D  Patient stated she had to cancel her appointment on 04/18/21 and will reschedule after the holidays.  Orinda Kenner, [place] Clinical Pharmacists Assistant [phone]

## 2021-03-24 ENCOUNTER — Telehealth: Payer: Self-pay

## 2021-03-24 NOTE — Progress Notes (Signed)
Chronic Care Management Pharmacy Assistant   Name: Morgan Hancock  MRN: [id number] DOB: Mar 13, 1946   Reason for Encounter: Disease State   Conditions to be addressed/monitored: HTN   Recent office visits:  02/21/21 Quay Burow (PCP) - Annual Exam. No med changes.  Recent consult visits:  None ID  Hospital visits:  None in previous 6 months  Medications: Outpatient Encounter Medications as of 03/24/2021  Medication Sig   alendronate (FOSAMAX) 70 MG tablet TAKE 1 TABLET BY MOUTH EVERY 7 (SEVEN) DAYS. TAKE WITH A FULL GLASS OF WATER ON AN EMPTY STOMACH.   aspirin 81 MG chewable tablet Chew 81 mg by mouth at bedtime.   atenolol (TENORMIN) 50 MG tablet TAKE 1 TABLET BY MOUTH EVERY DAY   BIOTIN PO Take 5,000 mcg by mouth daily.   CALCIUM-VITAMIN D PO Take 1 tablet by mouth 2 (two) times daily. 1200mg  / 1000 units   Coenzyme Q10 (CO Q 10) 10 MG CAPS Take 100 mg by mouth daily.   fenofibrate (TRICOR) 145 MG tablet Take 1 tablet (145 mg total) by mouth daily.   Omega-3 Fatty Acids (FISH OIL) 1000 MG CAPS Take 1 capsule by mouth daily.   rosuvastatin (CRESTOR) 5 MG tablet TAKE 1 TABLET (5 MG TOTAL) BY MOUTH 2 (TWO) TIMES A WEEK.   No facility-administered encounter medications on file as of 03/24/2021.   Reviewed chart prior to disease state call. Spoke with patient regarding BP  Recent Office Vitals: BP Readings from Last 3 Encounters:  02/21/21 128/70  11/15/20 122/80  03/12/20 134/84   Pulse Readings from Last 3 Encounters:  02/21/21 61  11/15/20 64  03/12/20 63    Wt Readings from Last 3 Encounters:  02/21/21 137 lb (62.1 kg)  11/15/20 135 lb 6.4 oz (61.4 kg)  03/12/20 135 lb 9.6 oz (61.5 kg)     Kidney Function Lab Results  Component Value Date/Time   CREATININE 1.01 02/21/2021 09:13 AM   CREATININE 1.03 02/18/2020 09:02 AM   GFR 54.55 (L) 02/21/2021 09:13 AM   GFRNONAA >60 05/12/2019 01:49 PM   GFRAA >60 05/12/2019 01:49 PM    BMP Latest Ref Rng & Units  02/21/2021 02/18/2020 05/12/2019  Glucose 70 - 99 mg/dL 99 90 92  BUN 6 - 23 mg/dL 20 21 17   Creatinine 0.40 - 1.20 mg/dL 1.01 1.03 0.89  Sodium 135 - 145 mEq/L 142 142 142  Potassium 3.5 - 5.1 mEq/L 4.3 4.0 4.1  Chloride 96 - 112 mEq/L 106 104 106  CO2 19 - 32 mEq/L 28 29 27   Calcium 8.4 - 10.5 mg/dL 9.7 9.8 10.6(H)    Current antihypertensive regimen:   Atenolol 50mg  daily   How often are you checking your Blood Pressure? several times per month  Current home BP readings: 124/72 03/23/21  What recent interventions/DTPs have been made by any provider to improve Blood Pressure control since last CPP Visit: None ID  Any recent hospitalizations or ED visits since last visit with CPP? No  What diet changes have been made to improve Blood Pressure Control?  Patient states that she has not made any changes to diet What exercise is being done to improve your Blood Pressure Control?  Patient states that she walks 2 or 3 times a day  Adherence Review: Is the patient currently on ACE/ARB medication? No Does the patient have >5 day gap between last estimated fill dates? No   Care Gaps: Colonoscopy -07/18/18 Diabetic Foot Exam -NA Mammogram -NA Ophthalmology -  NA Dexa Scan - 03/02/20 Annual Well Visit - 02/21/21 Micro albumin -NA Hemoglobin A1c - 02/21/21  Star Rating Drugs: Rosuvastatin - 02/11/21 [place] Clinical Pharmacist Assistant [phone]

## 2021-04-15 ENCOUNTER — Telehealth: Payer: BC Managed Care – PPO

## 2021-04-18 ENCOUNTER — Telehealth: Payer: BC Managed Care – PPO

## 2021-04-22 DIAGNOSIS — N390 Urinary tract infection, site not specified: Secondary | ICD-10-CM | POA: Diagnosis not present

## 2021-04-22 DIAGNOSIS — R300 Dysuria: Secondary | ICD-10-CM | POA: Diagnosis not present

## 2021-05-05 DIAGNOSIS — Z1231 Encounter for screening mammogram for malignant neoplasm of breast: Secondary | ICD-10-CM | POA: Diagnosis not present

## 2021-05-05 LAB — HM MAMMOGRAPHY

## 2021-06-09 DIAGNOSIS — D1801 Hemangioma of skin and subcutaneous tissue: Secondary | ICD-10-CM | POA: Diagnosis not present

## 2021-06-09 DIAGNOSIS — L218 Other seborrheic dermatitis: Secondary | ICD-10-CM | POA: Diagnosis not present

## 2021-06-09 DIAGNOSIS — L853 Xerosis cutis: Secondary | ICD-10-CM | POA: Diagnosis not present

## 2021-06-09 DIAGNOSIS — L814 Other melanin hyperpigmentation: Secondary | ICD-10-CM | POA: Diagnosis not present

## 2021-06-09 DIAGNOSIS — I8311 Varicose veins of right lower extremity with inflammation: Secondary | ICD-10-CM | POA: Diagnosis not present

## 2021-06-09 DIAGNOSIS — L570 Actinic keratosis: Secondary | ICD-10-CM | POA: Diagnosis not present

## 2021-06-20 ENCOUNTER — Encounter: Payer: Self-pay | Admitting: Internal Medicine

## 2021-06-20 NOTE — Progress Notes (Signed)
Outside notes received. Information abstracted. Notes sent to scan.  

## 2021-06-23 NOTE — Progress Notes (Signed)
Subjective:    Patient ID: Morgan Hancock, female    DOB: October 17, 1945, 76 y.o.   MRN: [id number]  This visit occurred during the SARS-CoV-2 public health emergency.  Safety protocols were in place, including screening questions prior to the visit, additional usage of staff PPE, and extensive cleaning of exam room while observing appropriate contact time as indicated for disinfecting solutions.    HPI The patient is here for an acute visit.  She is here for an acute visit for cold symptoms.   Her symptoms started 5 days ago.  Her husband is sick.   She is experiencing nasal congestion, PND, sore throat, dry cough and headaches.  She denies fevers, shortness of breath, sinus pain.  She has tried taking tylenol   Covid test neg 2/13.  She feels a lot better today.    She is also states abnormal smell to her urine.   Medications and allergies reviewed with patient and updated if appropriate.  Patient Active Problem List   Diagnosis Date Noted   Neck pain on left side 02/21/2021   Episodic lightheadedness 02/21/2021   Herpes zoster without complication 16/96/7893   Nausea 09/24/2019   HNP (herniated nucleus pulposus), cervical 05/14/2019   Carotid arterial disease ([place]) 06/13/2018   Rectal bleeding 06/13/2018   Prediabetes 12/20/2017   Hair thinning 12/18/2017   Atherosclerosis of aorta ([place]) 04/29/2017   Right lower quadrant abdominal pain 04/18/2017   Fibrocystic breast disease 11/20/2015   Osteopenia, high frax 10/29/2015   HLD (hyperlipidemia) 10/28/2015   Fatty liver 10/28/2015   Vitamin D deficiency 10/28/2015   Cataract 10/28/2015   DD (diverticular disease) 06/18/2013   Hx of adenomatous colonic polyps 06/18/2013   Hypertension 05/27/2013    Current Outpatient Medications on File Prior to Visit  Medication Sig Dispense Refill   alendronate (FOSAMAX) 70 MG tablet TAKE 1 TABLET BY MOUTH EVERY 7 (SEVEN) DAYS. TAKE WITH A FULL GLASS OF WATER ON AN EMPTY STOMACH.  12 tablet 3   aspirin 81 MG chewable tablet Chew 81 mg by mouth at bedtime.     atenolol (TENORMIN) 50 MG tablet TAKE 1 TABLET BY MOUTH EVERY DAY 90 tablet 3   BIOTIN PO Take 5,000 mcg by mouth daily.     CALCIUM-VITAMIN D PO Take 1 tablet by mouth 2 (two) times daily. 1200mg  / 1000 units     Coenzyme Q10 (CO Q 10) 10 MG CAPS Take 100 mg by mouth daily.     fenofibrate (TRICOR) 145 MG tablet Take 1 tablet (145 mg total) by mouth daily. 90 tablet 3   hydrocortisone (ANUSOL-HC) 2.5 % rectal cream Apply 1 application topically 2 (two) times daily.     ketoconazole (NIZORAL) 2 % cream Apply 1 application topically 2 (two) times daily.     Omega-3 Fatty Acids (FISH OIL) 1000 MG CAPS Take 1 capsule by mouth daily.     rosuvastatin (CRESTOR) 5 MG tablet TAKE 1 TABLET (5 MG TOTAL) BY MOUTH 2 (TWO) TIMES A WEEK. 27 tablet 3   No current facility-administered medications on file prior to visit.    Past Medical History:  Diagnosis Date   Cataract    Colon polyps 06/18/2013   Fatty liver    Hx of adenomatous colonic polyps 06/18/2013   Overview:  Had colonoscopy 06/18/2013 added new dx of colon polyps and Diverticuiosis    Hyperlipidemia    Hypertension    Osteopenia after menopause 2019    Past Surgical History:  Procedure Laterality Date   ABDOMINAL HYSTERECTOMY     prolapse   ANTERIOR CERVICAL DECOMP/DISCECTOMY FUSION N/A 05/14/2019   Procedure: ANTERIOR CERVICAL DECOMPRESSION/DISCECTOMY FUSION CERVICAL FIVE- CERVICAL SIX, CERVICAL SIX- CERVICAL SEVEN, CERVICAL SEVEN- THORACIC ONE;  Surgeon: Jovita Gamma, MD;  Location: [facility];  Service: Neurosurgery;  Laterality: N/A;  ANTERIOR CERVICAL DECOMPRESSION/DISCECTOMY FUSION CERVICAL 5- CERVICAL 6, CERVICAL 6- CERVICAL 7, CERVICAL 7- THORACIC 1   CHOLECYSTECTOMY     COLONOSCOPY  06/18/2013   GALLBLADDER SURGERY  [id number]   TONSILLECTOMY  03/12/1952   TUBAL LIGATION      Social History   Socioeconomic History   Marital status: Married     Spouse name: Not on file   Number of children: Not on file   Years of education: Not on file   Highest education level: Not on file  Occupational History   Not on file  Tobacco Use   Smoking status: Never   Smokeless tobacco: Never  Vaping Use   Vaping Use: Never used  Substance and Sexual Activity   Alcohol use: No   Drug use: No   Sexual activity: Not Currently  Other Topics Concern   Not on file  Social History Narrative   Married   Exercise: gardening, mows lawn, [place] [facility] [employer] [facility] [place]   No alcohol tobacco or drug use   Social Determinants of Radio broadcast assistant Strain: Low Risk    Difficulty of Paying Living Expenses: Not hard at all  Food Insecurity: No Food Insecurity   Worried About Charity fundraiser in the Last Year: Never true   Arboriculturist in the Last Year: Never true  Transportation Needs: No Transportation Needs   Lack of Transportation (Medical): No   Lack of Transportation (Non-Medical): No  Physical Activity: Sufficiently Active   Days of Exercise per Week: 5 days   Minutes of Exercise per Session: 30 min  Stress: No Stress Concern Present   Feeling of Stress : Not at all  Social Connections: Socially Integrated   Frequency of Communication with Friends and Family: Three times a week   Frequency of Social Gatherings with Friends and Family: Once a week   Attends Religious Services: More than 4 times per year   Active Member of [employer] or Organizations: Yes   Attends Music therapist: More than 4 times per year   Marital Status: Married    Family History  Problem Relation Age of Onset   Hyperlipidemia Mother    Heart disease Father    Diabetes Father    Parkinson's disease Father    Hyperlipidemia Brother    Hypertension Brother    Miscarriages / Stillbirths Maternal Grandmother    Miscarriages / Stillbirths Paternal Grandfather    Colon cancer Neg Hx    Stomach cancer Neg Hx    Colon polyps Neg Hx    Esophageal  cancer Neg Hx    Rectal cancer Neg Hx     Review of Systems  Constitutional:  Negative for fever.  HENT:  Positive for congestion, postnasal drip and sore throat. Negative for ear pain, sinus pressure and sinus pain.   Respiratory:  Positive for cough. Negative for shortness of breath and wheezing.   Gastrointestinal:  Negative for diarrhea and nausea.  Genitourinary:  Negative for dysuria, frequency (no change) and hematuria.       Abnormal urine smell  Musculoskeletal:  Positive for myalgias.  Neurological:  Positive for headaches.  Objective:   Vitals:   06/24/21 1601  BP: 140/76  Pulse: 70  Temp: 98.6 F (37 C)  SpO2: 95%   BP Readings from Last 3 Encounters:  06/24/21 140/76  02/21/21 128/70  11/15/20 122/80   Wt Readings from Last 3 Encounters:  06/24/21 138 lb (62.6 kg)  02/21/21 137 lb (62.1 kg)  11/15/20 135 lb 6.4 oz (61.4 kg)   Body mass index is 25.24 kg/m.   Physical Exam Constitutional:      General: She is not in acute distress.    Appearance: Normal appearance. She is not ill-appearing.  HENT:     Head: Normocephalic and atraumatic.     Right Ear: Tympanic membrane, ear canal and external ear normal.     Left Ear: Tympanic membrane, ear canal and external ear normal.     Mouth/Throat:     Mouth: Mucous membranes are moist.     Pharynx: No oropharyngeal exudate or posterior oropharyngeal erythema.  Eyes:     Conjunctiva/sclera: Conjunctivae normal.  Cardiovascular:     Rate and Rhythm: Normal rate and regular rhythm.  Pulmonary:     Effort: Pulmonary effort is normal. No respiratory distress.     Breath sounds: Normal breath sounds. No wheezing or rales.  Musculoskeletal:     Cervical back: Neck supple. No tenderness.  Lymphadenopathy:     Cervical: No cervical adenopathy.  Skin:    General: Skin is warm and dry.  Neurological:     Mental Status: She is alert.           Assessment & Plan:    See Problem List for Assessment  and Plan of chronic medical problems.

## 2021-06-24 ENCOUNTER — Encounter: Payer: Self-pay | Admitting: Internal Medicine

## 2021-06-24 ENCOUNTER — Ambulatory Visit (INDEPENDENT_AMBULATORY_CARE_PROVIDER_SITE_OTHER): Payer: Medicare Other | Admitting: Internal Medicine

## 2021-06-24 ENCOUNTER — Other Ambulatory Visit: Payer: Self-pay

## 2021-06-24 VITALS — BP 140/76 | HR 70 | Temp 98.6°F | Ht 62.0 in | Wt 138.0 lb

## 2021-06-24 DIAGNOSIS — J069 Acute upper respiratory infection, unspecified: Secondary | ICD-10-CM | POA: Diagnosis not present

## 2021-06-24 DIAGNOSIS — R8290 Unspecified abnormal findings in urine: Secondary | ICD-10-CM | POA: Insufficient documentation

## 2021-06-24 NOTE — Patient Instructions (Addendum)
° ° °  Urine tests ordered.  Give a sample downstairs.       Medications changes include :   none

## 2021-06-24 NOTE — Assessment & Plan Note (Signed)
Acute Abnormal urine smell, no other concerning symptoms Check urinalysis, urine culture

## 2021-06-24 NOTE — Assessment & Plan Note (Signed)
Acute Symptoms viral in nature Continue symptomatic treatment with over-the-counter cold medications, Tylenol/ibuprofen Increase rest and fluids Call if symptoms worsen or do not improve

## 2021-06-25 LAB — URINALYSIS, ROUTINE W REFLEX MICROSCOPIC
Bacteria, UA: NONE SEEN /HPF
Bilirubin Urine: NEGATIVE
Glucose, UA: NEGATIVE
Hgb urine dipstick: NEGATIVE
Nitrite: NEGATIVE
RBC / HPF: NONE SEEN /HPF (ref 0–2)
Specific Gravity, Urine: 1.022 (ref 1.001–1.035)
pH: 5.5 (ref 5.0–8.0)

## 2021-06-25 LAB — MICROSCOPIC MESSAGE

## 2021-06-25 LAB — URINE CULTURE

## 2021-08-09 ENCOUNTER — Ambulatory Visit (AMBULATORY_SURGERY_CENTER): Payer: BC Managed Care – PPO | Admitting: *Deleted

## 2021-08-09 VITALS — Ht 62.0 in | Wt 138.0 lb

## 2021-08-09 DIAGNOSIS — Z86010 Personal history of colonic polyps: Secondary | ICD-10-CM

## 2021-08-09 NOTE — Progress Notes (Signed)
Patient's pre-visit was done today over the phone with the patient. Name,DOB and address verified. Patient denies any allergies to Eggs and Soy. Patient denies any problems with anesthesia/sedation. Patient is not taking any diet pills or blood thinners. No home Oxygen. Insurance confirmed with patient. ? ?Prep instructions sent to pt's MyChart (if available) -pt is aware. Patient understands to call us back with any questions or concerns. Patient is aware of our care-partner policy and [id number] safety protocol.  ? ?EMMI education assigned to the patient for the procedure, sent to [place].  ? ?The patient is COVID-19 vaccinated.   ?

## 2021-08-30 ENCOUNTER — Ambulatory Visit (AMBULATORY_SURGERY_CENTER): Payer: Medicare Other | Admitting: Internal Medicine

## 2021-08-30 ENCOUNTER — Encounter: Payer: Self-pay | Admitting: Internal Medicine

## 2021-08-30 VITALS — BP 122/64 | HR 59 | Temp 98.7°F | Resp 14 | Ht 62.0 in | Wt 138.0 lb

## 2021-08-30 DIAGNOSIS — Z86010 Personal history of colonic polyps: Secondary | ICD-10-CM

## 2021-08-30 DIAGNOSIS — D124 Benign neoplasm of descending colon: Secondary | ICD-10-CM

## 2021-08-30 DIAGNOSIS — K635 Polyp of colon: Secondary | ICD-10-CM | POA: Diagnosis not present

## 2021-08-30 MED ORDER — SODIUM CHLORIDE 0.9 % IV SOLN: 500.0000 mL | Freq: Once | INTRAVENOUS | Status: DC

## 2021-08-30 NOTE — Patient Instructions (Addendum)
I found and removed one tiny polyp. ? ?It looks benign - will let you know after pathology review. ? ?I will not be recommending a routine repeat colonoscopy. ? ?I appreciate the opportunity to care for you. ?Gatha Mayer, MD, [facility] ? ? ?Handouts were given to your care partner on polyps, diverticulosis, and hemorrhoids. ?You may resume your current medications today. ?Await biopsy results.  May take 1-3 weeks to receive pathology results. ?No repeat colonoscopy due to age. ?Please call if any questions or concerns. ?  ? ? ? ?YOU HAD AN ENDOSCOPIC PROCEDURE TODAY AT [facility]:   Refer to the procedure report that was given to you for any specific questions about what was found during the examination.  If the procedure report does not answer your questions, please call your gastroenterologist to clarify.  If you requested that your care partner not be given the details of your procedure findings, then the procedure report has been included in a sealed envelope for you to review at your convenience later. ? ?YOU SHOULD EXPECT: Some feelings of bloating in the abdomen. Passage of more gas than usual.  Walking can help get rid of the air that was put into your GI tract during the procedure and reduce the bloating. If you had a lower endoscopy (such as a colonoscopy or flexible sigmoidoscopy) you may notice spotting of blood in your stool or on the toilet paper. If you underwent a bowel prep for your procedure, you may not have a normal bowel movement for a few days. ? ?Please Note:  You might notice some irritation and congestion in your nose or some drainage.  This is from the oxygen used during your procedure.  There is no need for concern and it should clear up in a day or so. ? ?SYMPTOMS TO REPORT IMMEDIATELY: ? ?Following lower endoscopy (colonoscopy or flexible sigmoidoscopy): ? Excessive amounts of blood in the stool ? Significant tenderness or worsening of abdominal pains ? Swelling of the  abdomen that is new, acute ? Fever of 100?F or higher ? ?For urgent or emergent issues, a gastroenterologist can be reached at any hour by calling [phone]. ?Do not use MyChart messaging for urgent concerns.  ? ? ?DIET:  We do recommend a small meal at first, but then you may proceed to your regular diet.  Drink plenty of fluids but you should avoid alcoholic beverages for 24 hours. ? ?ACTIVITY:  You should plan to take it easy for the rest of today and you should NOT DRIVE or use heavy machinery until tomorrow (because of the sedation medicines used during the test).   ? ?FOLLOW UP: ?Our staff will call the number listed on your records 48-72 hours following your procedure to check on you and address any questions or concerns that you may have regarding the information given to you following your procedure. If we do not reach you, we will leave a message.  We will attempt to reach you two times.  During this call, we will ask if you have developed any symptoms of COVID 19. If you develop any symptoms (ie: fever, flu-like symptoms, shortness of breath, cough etc.) before then, please call [phone].  If you test positive for Covid 19 in the 2 weeks post procedure, please call and report this information to [place].   ? ?If any biopsies were taken you will be contacted by phone or by letter within the next 1-3 weeks.  Please call us  at [phone] if you have not heard about the biopsies in 3 weeks.  ? ? ?SIGNATURES/CONFIDENTIALITY: ?You and/or your care partner have signed paperwork which will be entered into your electronic medical record.  These signatures attest to the fact that that the information above on your After Visit Summary has been reviewed and is understood.  Full responsibility of the confidentiality of this discharge information lies with you and/or your care-partner.  ?

## 2021-08-30 NOTE — Progress Notes (Signed)
No problems noted in the recovery room. maw 

## 2021-08-30 NOTE — Progress Notes (Signed)
Called to room to assist during endoscopic procedure.  Patient ID and intended procedure confirmed with present staff. Received instructions for my participation in the procedure from the performing physician.  

## 2021-08-30 NOTE — Op Note (Signed)
[place] ?Patient Name: Morgan Hancock ?Procedure Date: 08/30/2021 11:03 AM ?MRN: [id number] ?Endoscopist: Gatha Mayer , MD ?Age: 76 ?Referring MD:  ?Date of Birth: 1945/10/15 ?Gender: Female ?Account #: [id number] ?Procedure:                Colonoscopy ?Indications:              Surveillance: Personal history of adenomatous  ?                          polyps on last colonoscopy 3 years ago, Last  ?                          colonoscopy: March 2020 ?Medicines:                Monitored Anesthesia Care ?Procedure:                Pre-Anesthesia Assessment: ?                          - Prior to the procedure, a History and Physical  ?                          was performed, and patient medications and  ?                          allergies were reviewed. The patient's tolerance of  ?                          previous anesthesia was also reviewed. The risks  ?                          and benefits of the procedure and the sedation  ?                          options and risks were discussed with the patient.  ?                          All questions were answered, and informed consent  ?                          was obtained. Prior Anticoagulants: The patient has  ?                          taken no previous anticoagulant or antiplatelet  ?                          agents. ASA Grade Assessment: II - A patient with  ?                          mild systemic disease. After reviewing the risks  ?                          and benefits, the patient was deemed in  ?  satisfactory condition to undergo the procedure. ?                          After obtaining informed consent, the colonoscope  ?                          was passed under direct vision. Throughout the  ?                          procedure, the patient's blood pressure, pulse, and  ?                          oxygen saturations were monitored continuously. The  ?                          PCF-HQ190L Colonoscope was introduced through  the  ?                          anus and advanced to the the cecum, identified by  ?                          appendiceal orifice and ileocecal valve. The  ?                          colonoscopy was performed without difficulty. The  ?                          patient tolerated the procedure well. The quality  ?                          of the bowel preparation was good. The ileocecal  ?                          valve, appendiceal orifice, and rectum were  ?                          photographed. The bowel preparation used was  ?                          Miralax via split dose instruction. ?Scope In: 11:14:31 AM ?Scope Out: 11:33:48 AM ?Scope Withdrawal Time: 0 hours 14 minutes 28 seconds  ?Total Procedure Duration: 0 hours 19 minutes 17 seconds  ?Findings:                 The perianal and digital rectal examinations were  ?                          normal. ?                          A diminutive polyp was found in the distal  ?                          descending colon. The polyp was sessile. The polyp  ?  was removed with a cold snare. Resection and  ?                          retrieval were complete. Verification of patient  ?                          identification for the specimen was done. Estimated  ?                          blood loss was minimal. ?                          Multiple diverticula were found in the sigmoid  ?                          colon. ?                          External and internal hemorrhoids were found. ?                          The exam was otherwise without abnormality on  ?                          direct and retroflexion views. ?Complications:            No immediate complications. ?Estimated Blood Loss:     Estimated blood loss was minimal. ?Impression:               - One diminutive polyp in the distal descending  ?                          colon, removed with a cold snare. Resected and  ?                          retrieved. ?                          -  Diverticulosis in the sigmoid colon. ?                          - External and internal hemorrhoids. ?                          - The examination was otherwise normal on direct  ?                          and retroflexion views. ?                          - Personal history of colonic polyps. 2015 3  ?                          adenomas ?                          07/2018 3 adenomas ?Recommendation:           -  Patient has a contact number available for  ?                          emergencies. The signs and symptoms of potential  ?                          delayed complications were discussed with the  ?                          patient. Return to normal activities tomorrow.  ?                          Written discharge instructions were provided to the  ?                          patient. ?                          - Resume previous diet. ?                          - Continue present medications. ?                          - No repeat colonoscopy due to age. ?Gatha Mayer, MD ?08/30/2021 11:40:55 AM ?This report has been signed electronically. ?

## 2021-08-30 NOTE — Progress Notes (Signed)
[facility] History and Physical ? ? ?Primary Care Physician:  Binnie Rail, MD ? ? ?Reason for Procedure:   Hx polyps ? ?Plan:    colonoscopy ? ? ? ? ?HPI: Morgan Hancock is a 76 y.o. female here for surveillance colonoscopy ? ?2015 3 adenomas ?07/2018 3 adenomas  ?Past Medical History:  ?Diagnosis Date  ? Cataract   ? Colon polyps 06/18/2013  ? Fatty liver   ? Hx of adenomatous colonic polyps 06/18/2013  ? Overview:  Had colonoscopy 06/18/2013 added new dx of colon polyps and Diverticuiosis   ? Hyperlipidemia   ? Hypertension   ? Osteopenia after menopause 2019  ? ? ?Past Surgical History:  ?Procedure Laterality Date  ? ABDOMINAL HYSTERECTOMY    ? prolapse  ? ANTERIOR CERVICAL DECOMP/DISCECTOMY FUSION N/A 05/14/2019  ? Procedure: ANTERIOR CERVICAL DECOMPRESSION/DISCECTOMY FUSION CERVICAL FIVE- CERVICAL SIX, CERVICAL SIX- CERVICAL SEVEN, CERVICAL SEVEN- THORACIC ONE;  Surgeon: Jovita Gamma, MD;  Location: [facility];  Service: Neurosurgery;  Laterality: N/A;  ANTERIOR CERVICAL DECOMPRESSION/DISCECTOMY FUSION CERVICAL 5- CERVICAL 6, CERVICAL 6- CERVICAL 7, CERVICAL 7- THORACIC 1  ? CHOLECYSTECTOMY    ? COLONOSCOPY  06/18/2013  ? COLONOSCOPY WITH PROPOFOL  07/18/2018  ? Dr.Luceil Herrin  ? GALLBLADDER SURGERY  07/06/2012  ? TONSILLECTOMY  03/12/1952  ? TUBAL LIGATION    ? ? ?Prior to Admission medications   ?Medication Sig Start Date End Date Taking? Authorizing Provider  ?alendronate (FOSAMAX) 70 MG tablet TAKE 1 TABLET BY MOUTH EVERY 7 (SEVEN) DAYS. TAKE WITH A FULL GLASS OF WATER ON AN EMPTY STOMACH. 02/07/21   Binnie Rail, MD  ?aspirin 81 MG chewable tablet Chew 81 mg by mouth at bedtime.    [provider]  ?atenolol (TENORMIN) 50 MG tablet TAKE 1 TABLET BY MOUTH EVERY DAY 02/11/21   Binnie Rail, MD  ?BIOTIN PO Take 5,000 mcg by mouth daily.    [provider]  ?CALCIUM-VITAMIN D PO Take 1 tablet by mouth 2 (two) times daily. '1200mg'$  / 1000 units    [provider]  ?Coenzyme  Q10 (CO Q 10) 10 MG CAPS Take 100 mg by mouth daily.    [provider]  ?fenofibrate (TRICOR) 145 MG tablet Take 1 tablet (145 mg total) by mouth daily. 02/21/21   Binnie Rail, MD  ?hydrocortisone (ANUSOL-HC) 2.5 % rectal cream Apply 1 application topically 2 (two) times daily. 06/09/21   [provider]  ?ketoconazole (NIZORAL) 2 % cream Apply 1 application topically 2 (two) times daily. 06/09/21   [provider]  ?Omega-3 Fatty Acids (FISH OIL) 1000 MG CAPS Take 1 capsule by mouth daily. 07/01/12   [provider]  ?rosuvastatin (CRESTOR) 5 MG tablet TAKE 1 TABLET (5 MG TOTAL) BY MOUTH 2 (TWO) TIMES A WEEK. 02/14/21   Binnie Rail, MD  ? ? ?Current Outpatient Medications  ?Medication Sig Dispense Refill  ? alendronate (FOSAMAX) 70 MG tablet TAKE 1 TABLET BY MOUTH EVERY 7 (SEVEN) DAYS. TAKE WITH A FULL GLASS OF WATER ON AN EMPTY STOMACH. 12 tablet 3  ? aspirin 81 MG chewable tablet Chew 81 mg by mouth at bedtime.    ? atenolol (TENORMIN) 50 MG tablet TAKE 1 TABLET BY MOUTH EVERY DAY 90 tablet 3  ? BIOTIN PO Take 5,000 mcg by mouth daily.    ? CALCIUM-VITAMIN D PO Take 1 tablet by mouth 2 (two) times daily. '1200mg'$  / 1000 units    ? Coenzyme Q10 (CO Q  10) 10 MG CAPS Take 100 mg by mouth daily.    ? fenofibrate (TRICOR) 145 MG tablet Take 1 tablet (145 mg total) by mouth daily. 90 tablet 3  ? hydrocortisone (ANUSOL-HC) 2.5 % rectal cream Apply 1 application topically 2 (two) times daily.    ? ketoconazole (NIZORAL) 2 % cream Apply 1 application topically 2 (two) times daily.    ? Omega-3 Fatty Acids (FISH OIL) 1000 MG CAPS Take 1 capsule by mouth daily.    ? rosuvastatin (CRESTOR) 5 MG tablet TAKE 1 TABLET (5 MG TOTAL) BY MOUTH 2 (TWO) TIMES A WEEK. 27 tablet 3  ? ?Current Facility-Administered Medications  ?Medication Dose Route Frequency Provider Last Rate Last Admin  ? 0.9 %  sodium chloride infusion  500 mL Intravenous Once Gatha Mayer, MD      ? ? ?Allergies as of  08/30/2021 - Review Complete 08/30/2021  ?Allergen Reaction Noted  ? Lipitor [atorvastatin]  10/28/2015  ? ? ?Family History  ?Problem Relation Age of Onset  ? Hyperlipidemia Mother   ? Heart disease Father   ? Diabetes Father   ? Parkinson's disease Father   ? Hyperlipidemia Brother   ? Hypertension Brother   ? Miscarriages / Stillbirths Maternal Grandmother   ? Miscarriages / Stillbirths Paternal Grandfather   ? Colon cancer Neg Hx   ? Stomach cancer Neg Hx   ? Colon polyps Neg Hx   ? Esophageal cancer Neg Hx   ? Rectal cancer Neg Hx   ? ? ?Social History  ? ?Socioeconomic History  ? Marital status: Married  ?  Spouse name: Not on file  ? Number of children: Not on file  ? Years of education: Not on file  ? Highest education level: Not on file  ?Occupational History  ? Not on file  ?Tobacco Use  ? Smoking status: Never  ? Smokeless tobacco: Never  ?Vaping Use  ? Vaping Use: Never used  ?Substance and Sexual Activity  ? Alcohol use: No  ? Drug use: No  ? Sexual activity: Not Currently  ?Other Topics Concern  ? Not on file  ?Social History Narrative  ? Married  ? Exercise: gardening, mows lawn, [place] [facility] [employer] [facility] [place]  ? No alcohol tobacco or drug use  ? ?Social Determinants of Health  ? ?Financial Resource Strain: Low Risk   ? Difficulty of Paying Living Expenses: Not hard at all  ?Food Insecurity: No Food Insecurity  ? Worried About Charity fundraiser in the Last Year: Never true  ? Ran Out of Food in the Last Year: Never true  ?Transportation Needs: No Transportation Needs  ? Lack of Transportation (Medical): No  ? Lack of Transportation (Non-Medical): No  ?Physical Activity: Sufficiently Active  ? Days of Exercise per Week: 5 days  ? Minutes of Exercise per Session: 30 min  ?Stress: No Stress Concern Present  ? Feeling of Stress : Not at all  ?Social Connections: Socially Integrated  ? Frequency of Communication with Friends and Family: Three times a week  ? Frequency of Social Gatherings with Friends and  Family: Once a week  ? Attends Religious Services: More than 4 times per year  ? Active Member of Clubs or Organizations: Yes  ? Attends Archivist Meetings: More than 4 times per year  ? Marital Status: Married  ?Intimate Partner Violence: Not At Risk  ? Fear of Current or Ex-Partner: No  ? Emotionally Abused: No  ? Physically  Abused: No  ? Sexually Abused: No  ? ? ?Review of Systems: ? ?All other review of systems negative except as mentioned in the HPI. ? ?Physical Exam: ?Vital signs ?BP 136/66 (BP Location: Right Arm, Patient Position: Sitting, Cuff Size: Normal)   Pulse 60   Temp 98.7 ?F (37.1 ?C) (Temporal)   Ht '5\' 2"'$  (1.575 m)   Wt 138 lb (62.6 kg)   SpO2 99%   BMI 25.24 kg/m?  ? ?General:   Alert,  Well-developed, well-nourished, pleasant and cooperative in NAD ?Lungs:  Clear throughout to auscultation.   ?Heart:  Regular rate and rhythm; no murmurs, clicks, rubs,  or gallops. ?Abdomen:  Soft, nontender and nondistended. Normal bowel sounds.   ?Neuro/Psych:  Alert and cooperative. Normal mood and affect. A and O x 3 ? ? ?'@Danyell Shader'$  Simonne Maffucci, MD, [facility] ?[facility] ?[phone] (pager) ?08/30/2021 11:05 AM@ ? ?

## 2021-08-30 NOTE — Progress Notes (Signed)
Vitals-DT  Pt's states no medical or surgical changes since previsit or office visit.  

## 2021-08-30 NOTE — Progress Notes (Signed)
Pt non-responsive, VVS, Report to RN  °

## 2021-09-01 ENCOUNTER — Telehealth: Payer: Self-pay

## 2021-09-01 NOTE — Telephone Encounter (Signed)
?  Follow up Call- ? ? ?  08/30/2021  ? 10:24 AM  ?Call back number  ?Post procedure Call Back phone  # [phone]  ?Permission to leave phone message Yes  ?  ? ?Patient questions: ? ?Do you have a fever, pain , or abdominal swelling? No. ?Pain Score  0 * ? ?Have you tolerated food without any problems? Yes.   ? ?Have you been able to return to your normal activities? Yes.   ? ?Do you have any questions about your discharge instructions: ?Diet   No. ?Medications  No. ?Follow up visit  No. ? ?Do you have questions or concerns about your Care? No. ? ?Actions: ?* If pain score is 4 or above: ?No action needed, pain <4. ? ? ?

## 2021-09-02 ENCOUNTER — Encounter: Payer: Self-pay | Admitting: Internal Medicine

## 2021-09-15 DIAGNOSIS — H43813 Vitreous degeneration, bilateral: Secondary | ICD-10-CM | POA: Diagnosis not present

## 2021-12-08 DIAGNOSIS — N8110 Cystocele, unspecified: Secondary | ICD-10-CM | POA: Diagnosis not present

## 2021-12-12 DIAGNOSIS — N8111 Cystocele, midline: Secondary | ICD-10-CM | POA: Diagnosis not present

## 2021-12-20 NOTE — Therapy (Unsigned)
OUTPATIENT PHYSICAL THERAPY FEMALE PELVIC EVALUATION   Patient Name: Morgan Hancock MRN: [id number] DOB:[birth year], 76 y.o., female Today's Date: 12/21/2021   PT End of Session - 12/21/21 1223     Visit Number 1    Date for PT Re-Evaluation 03/15/22    Authorization Type [place] - Visit Number 1    Authorization - Number of Visits 10    PT Start Time 7793    PT Stop Time 1220    PT Time Calculation (min) 35 min    Activity Tolerance Patient tolerated treatment well    Behavior During Therapy [facility] for tasks assessed/performed             Past Medical History:  Diagnosis Date   Cataract    Colon polyps 06/18/2013   Fatty liver    Hx of adenomatous colonic polyps 06/18/2013   Overview:  Had colonoscopy 06/18/2013 added new dx of colon polyps and Diverticuiosis    Hyperlipidemia    Hypertension    Osteopenia after menopause 2019   Past Surgical History:  Procedure Laterality Date   ABDOMINAL HYSTERECTOMY     prolapse   ANTERIOR CERVICAL DECOMP/DISCECTOMY FUSION N/A 05/14/2019   Procedure: ANTERIOR CERVICAL DECOMPRESSION/DISCECTOMY FUSION CERVICAL FIVE- CERVICAL SIX, CERVICAL SIX- CERVICAL SEVEN, CERVICAL SEVEN- THORACIC ONE;  Surgeon: Jovita Gamma, MD;  Location: [facility];  Service: Neurosurgery;  Laterality: N/A;  ANTERIOR CERVICAL DECOMPRESSION/DISCECTOMY FUSION CERVICAL 5- CERVICAL 6, CERVICAL 6- CERVICAL 7, CERVICAL 7- THORACIC 1   CHOLECYSTECTOMY     COLONOSCOPY  06/18/2013   COLONOSCOPY WITH PROPOFOL  07/18/2018   Dr.Gessner   GALLBLADDER SURGERY  07/06/2012   TONSILLECTOMY  03/12/1952   TUBAL LIGATION     Patient Active Problem List   Diagnosis Date Noted   Abnormal urine odor 06/24/2021   Viral upper respiratory tract infection 06/24/2021   Neck pain on left side 02/21/2021   Episodic lightheadedness 02/21/2021   Herpes zoster without complication 90/30/0923   Nausea 09/24/2019   HNP (herniated nucleus pulposus), cervical 05/14/2019    Carotid arterial disease ([place]) 06/13/2018   Rectal bleeding 06/13/2018   Prediabetes 12/20/2017   Hair thinning 12/18/2017   Atherosclerosis of aorta ([place]) 04/29/2017   Right lower quadrant abdominal pain 04/18/2017   Fibrocystic breast disease 11/20/2015   Osteopenia, high frax 10/29/2015   HLD (hyperlipidemia) 10/28/2015   Fatty liver 10/28/2015   Vitamin D deficiency 10/28/2015   Cataract 10/28/2015   DD (diverticular disease) 06/18/2013   Hx of adenomatous colonic polyps 06/18/2013   Hypertension 05/27/2013    PCP: Binnie Rail, MD  REFERRING PROVIDER: Dian Queen, MD  REFERRING DIAG: N81.10 (ICD-10-CM) - Cystocele, unspecified  THERAPY DIAG:  Muscle weakness (generalized)  Cystocele, unspecified  Rationale for Evaluation and Treatment Rehabilitation  ONSET DATE: 11/20/2021  SUBJECTIVE:  SUBJECTIVE STATEMENT: Last month patient felt pressure in the pelvic area. Patient felt the prolapse and able to push it back in.  Fluid intake: water, juice, milk, tea, 1 soft drink per day    PAIN:  Are you having pain? Yes NPRS scale: 4/10 Pain location: Anterior lower abdominal area  Pain type: aching Pain description: intermittent   Aggravating factors: when she gets up in the morning Relieving factors: get up and walk around  PRECAUTIONS: None  WEIGHT BEARING RESTRICTIONS No  FALLS:  Has patient fallen in last 6 months? No  LIVING ENVIRONMENT: Lives with: lives with their spouse  OCCUPATION: retired Education officer, [employer]  PLOF: Independent  PATIENT GOALS reduce prolapse, control her gas  PERTINENT HISTORY:  Osteopenia; Abdominal hysterectomy 2014; Gall bladder ; Anterior cervical disectomy fusion 1/6/21surgery   BOWEL MOVEMENT Pain with bowel movement: No Type of bowel  movement:Type (Bristol Stool Scale) firt bit is Type 1 then type 4, Frequency 2 x per day, and Strain No Fully empty rectum: Yes:   Leakage: No  URINATION Pain with urination: No Fully empty bladder: Yes: sits longer than feels a little comes out Stream: Strong Urgency: Yes: has to grab herself , when she hears water running, when she gets home from the store Frequency: urinate every 2-3 hours Leakage: Coughing and Sneezing as she walks to the bathroom Pads: Yes: 1 during day  INTERCOURSE Pain with intercourse: Initial Penetration and During Penetration using toys Ability to have vaginal penetration:  Yes:   Climax: very rarely Marinoff Scale: 1/3  PREGNANCY Vaginal deliveries first one was breech and needed to do surgery to remove the baby, 1 live birth  Currently pregnant No  PROLAPSE Cystocele comes out with straining and end of day.     OBJECTIVE:   DIAGNOSTIC FINDINGS:  none    COGNITION:  Overall cognitive status: Within functional limits for tasks assessed     SENSATION:  Light touch: Appears intact  Proprioception: Appears intact                POSTURE: No Significant postural limitations   PELVIC ALIGNMENT: Correct alignment  LUMBARAROM/PROM  A/PROM A/PROM  eval  Flexion full  Extension Decreased by 50%  Right lateral flexion Decreased by 25%  Left lateral flexion Decreased by 25%  Right rotation Decreased by 25%  Left rotation Decreased by 25%   (Blank rows = not tested)  LOWER EXTREMITY ROM:  Passive ROM Right eval Left eval  Hip external rotation 50 50   (Blank rows = not tested)  LOWER EXTREMITY MMT:  MMT Right eval Left eval  Hip abduction 4/5     PALPATION:   General  lower abdominal does not contract and the upper abdominal contracts the most.                 External Perineal Exam redness around the vulvar area and introitus                             Internal Pelvic Floor tightness on the left iliococcygeus and  tenderness  Patient confirms identification and approves PT to assess internal pelvic floor and treatment Yes  PELVIC MMT:   MMT eval  Vaginal 3/5 with small lift  (Blank rows = not tested)        TONE: good  PROLAPSE: Anterior wall weakness that comes just out of of the introitus  TODAY'S TREATMENT  EVAL Date: 12/21/2021  HEP established-see below    PATIENT EDUCATION:  12/21/2021 Education details: Access Code: [email or web address]; education on vaginal moisturizers Person educated: Patient Education method: Explanation, Demonstration, Tactile cues, Verbal cues, and Handouts Education comprehension: verbalized understanding, returned demonstration, verbal cues required, tactile cues required, and needs further education   HOME EXERCISE PROGRAM: 12/21/2021 Access Code: [email or web address] URL: [email or web address] Date: 12/21/2021 Prepared by: Earlie Counts  Exercises - Supine Pelvic Floor Contraction  - 3 x daily - 7 x weekly - 1 sets - 10 reps - 5 sec hold  ASSESSMENT:  CLINICAL IMPRESSION: Patient is a 76 y.o. female  who was seen today for physical therapy evaluation and treatment for cystocele. Patient has noticed the bladder drop 1 month ago. She had a repair of the cystocele and rectocele several years ago. She has lower abdominal intermittent pain at level 4/10 in the morning. Pelvic floor strength is 3/5 holding for 3 sec Anterior wall weakness just out of the introitus when in supine and she is bearing down. She has tightness along the left iliococcygeus. Patient has some vaginal dryness, the vulvar area is red and introitus is red. Patient does not contract the lower abdominals and will over contract the upper abdominals. Bilateral hip abduction is 4/5. She has limited lumbar ROM. Patient will benefit skilled therapy to improve pelvic floor strength and coordination while being educated on prolapse management.    OBJECTIVE IMPAIRMENTS decreased coordination, decreased  endurance, decreased ROM, decreased strength, increased fascial restrictions, and pain.   ACTIVITY LIMITATIONS carrying, lifting, bending, sitting, standing, squatting, and transfers  PARTICIPATION LIMITATIONS: cleaning, laundry, and community activity  PERSONAL FACTORS Age, Fitness, Past/current experiences, and 1-2 comorbidities:    Osteopenia; Abdominal hysterectomy 2014; Gall bladder ; Anterior cervical disectomy fusion 1/6/21surgeryare also affecting patient's functional outcome.   REHAB POTENTIAL: Good  CLINICAL DECISION MAKING: Evolving/moderate complexity  EVALUATION COMPLEXITY: Moderate   GOALS: Goals reviewed with patient? Yes  SHORT TERM GOALS: Target date: 01/18/2022  Patient independent with her intial program for pelvic floor strength and lower abdominal strength Baseline: Goal status: INITIAL  2.  Lower abdominal pain decreased >/= 50%  since initial eval due to improved strength in the pelvic floor.  Baseline:  Goal status: INITIAL  3.  Patient was educated on vaginal moisturizers to use to improve the health of the vaginal tissue. Baseline:  Goal status: INITIAL   LONG TERM GOALS: Target date: 03/15/2022   Patient is independent with her pelvic floor and core exercises.  Baseline:  Goal status: INITIAL  2.  Patient understands pressure management with daily activities to reduce the strain on the pelvic floor. Baseline:  Goal status: INITIAL  3.  Patient feels her prolapse improved >/= 50% with using the prolapse management techniques and increasing pelvic floor strength.  Baseline:  Goal status: INITIAL  4.  Patient reports she is straining 50% less with correct toileting techniques and to reduce the pressure on her cystocele.  Baseline:  Goal status: INITIAL    PLAN: PT FREQUENCY: 1x/week  PT DURATION: 12 weeks  PLANNED INTERVENTIONS: Therapeutic exercises, Therapeutic activity, Neuromuscular re-education, Patient/Family education, Self Care,  Electrical stimulation, Cryotherapy, Moist heat, Biofeedback, and Manual therapy  PLAN FOR NEXT SESSION: supine bridge, supine hip flexion isometric, supine hip adduction and abduction, manual work to the abdomen to work on equal contraction of upper and lower   Earlie Counts, PT 12/21/21 3:04 PM

## 2021-12-21 ENCOUNTER — Ambulatory Visit: Payer: Medicare Other | Attending: Obstetrics and Gynecology | Admitting: Physical Therapy

## 2021-12-21 ENCOUNTER — Other Ambulatory Visit: Payer: Self-pay

## 2021-12-21 ENCOUNTER — Encounter: Payer: Self-pay | Admitting: Physical Therapy

## 2021-12-21 DIAGNOSIS — N8110 Cystocele, unspecified: Secondary | ICD-10-CM | POA: Insufficient documentation

## 2021-12-21 DIAGNOSIS — M6281 Muscle weakness (generalized): Secondary | ICD-10-CM | POA: Diagnosis not present

## 2021-12-21 NOTE — Patient Instructions (Signed)
Moisturizers They are used in the vagina to hydrate the mucous membrane that make up the vaginal canal. Designed to keep a more normal acid balance (ph) Once placed in the vagina, it will last between two to three days.  Use 2-3 times per week at bedtime  Ingredients to avoid is glycerin and fragrance, can increase chance of infection Should not be used just before sex due to causing irritation Most are gels administered either in a tampon-shaped applicator or as a vaginal suppository. They are non-hormonal.   Types of Moisturizers(internal use) 2 times per week  Vitamin E vaginal suppositories- Whole foods, Amazon Moist Again Coconut oil- can break down condoms Julva- (Do no use if on Tamoxifen) amazon Yes moisturizer- amazon Olive and Bee intimate cream- [email or web address].[email or web address] Mae vaginal moisturizer- Amazon Aloe Good Clean Love Hyaluronic acid Hyalofemme replens   Creams to use externally on the Vulva area daily V-magic cream - amazon Julva-amazon Vital "V Wild Yam salve ( help moisturize and help with thinning vulvar area, does have [place] by Irwin Brakeman labial moisturizer (Amazon,  Coconut or olive oil aloe Good Clean Love Enchanted Rose by intimate rose  Things to avoid in the vaginal area Do not use things to irritate the vulvar area No lotions just specialized creams for the vulva area- Neogyn, V-magic, No soaps; can use Aveeno or Calendula cleanser if needed. Must be gentle No deodorants No douches Good to sleep without underwear to let the vaginal area to air out No scrubbing: spread the lips to let warm water rinse over labias and pat dry  [facility] [address], [place], [place] [address] Phone # [phone] Fax [phone]

## 2021-12-29 ENCOUNTER — Encounter: Payer: Self-pay | Admitting: Physical Therapy

## 2021-12-29 ENCOUNTER — Encounter: Payer: Medicare Other | Attending: Obstetrics and Gynecology | Admitting: Physical Therapy

## 2021-12-29 DIAGNOSIS — M6281 Muscle weakness (generalized): Secondary | ICD-10-CM | POA: Diagnosis not present

## 2021-12-29 DIAGNOSIS — N8110 Cystocele, unspecified: Secondary | ICD-10-CM | POA: Diagnosis not present

## 2021-12-29 NOTE — Therapy (Signed)
OUTPATIENT PHYSICAL THERAPY TREATMENT NOTE   Patient Name: Morgan Hancock MRN: [id number] DOB:1946/05/08, 76 y.o., female Today's Date: 12/29/2021  PCP: Binnie Rail, MD REFERRING PROVIDER: Dian Queen, MD  END OF SESSION:   PT End of Session - 12/29/21 0932     Visit Number 2    Date for PT Re-Evaluation 03/15/22    Authorization Type UHC medicare    Authorization - Visit Number 2    Authorization - Number of Visits 10    PT Start Time 0930    PT Stop Time 0041    PT Time Calculation (min) 45 min    Activity Tolerance Patient tolerated treatment well    Behavior During Therapy [facility] for tasks assessed/performed             Past Medical History:  Diagnosis Date   Cataract    Colon polyps 06/18/2013   Fatty liver    Hx of adenomatous colonic polyps 06/18/2013   Overview:  Had colonoscopy 06/18/2013 added new dx of colon polyps and Diverticuiosis    Hyperlipidemia    Hypertension    Osteopenia after menopause 2019   Past Surgical History:  Procedure Laterality Date   ABDOMINAL HYSTERECTOMY     prolapse   ANTERIOR CERVICAL DECOMP/DISCECTOMY FUSION N/A 05/14/2019   Procedure: ANTERIOR CERVICAL DECOMPRESSION/DISCECTOMY FUSION CERVICAL FIVE- CERVICAL SIX, CERVICAL SIX- CERVICAL SEVEN, CERVICAL SEVEN- THORACIC ONE;  Surgeon: Jovita Gamma, MD;  Location: [facility];  Service: Neurosurgery;  Laterality: N/A;  ANTERIOR CERVICAL DECOMPRESSION/DISCECTOMY FUSION CERVICAL 5- CERVICAL 6, CERVICAL 6- CERVICAL 7, CERVICAL 7- THORACIC 1   CHOLECYSTECTOMY     COLONOSCOPY  06/18/2013   COLONOSCOPY WITH PROPOFOL  07/18/2018   Dr.Gessner   GALLBLADDER SURGERY  07/06/2012   TONSILLECTOMY  03/12/1952   TUBAL LIGATION     Patient Active Problem List   Diagnosis Date Noted   Abnormal urine odor 06/24/2021   Viral upper respiratory tract infection 06/24/2021   Neck pain on left side 02/21/2021   Episodic lightheadedness 02/21/2021   Herpes zoster without complication 59/30/1237    Nausea 09/24/2019   HNP (herniated nucleus pulposus), cervical 05/14/2019   Carotid arterial disease ([place]) 06/13/2018   Rectal bleeding 06/13/2018   Prediabetes 12/20/2017   Hair thinning 12/18/2017   Atherosclerosis of aorta ([place]) 04/29/2017   Right lower quadrant abdominal pain 04/18/2017   Fibrocystic breast disease 11/20/2015   Osteopenia, high frax 10/29/2015   HLD (hyperlipidemia) 10/28/2015   Fatty liver 10/28/2015   Vitamin D deficiency 10/28/2015   Cataract 10/28/2015   DD (diverticular disease) 06/18/2013   Hx of adenomatous colonic polyps 06/18/2013   Hypertension 05/27/2013   REFERRING PROVIDER: Dian Queen, MD   REFERRING DIAG: N81.10 (ICD-10-CM) - Cystocele, unspecified   THERAPY DIAG:  Muscle weakness (generalized)   Cystocele, unspecified   Rationale for Evaluation and Treatment Rehabilitation   ONSET DATE: 11/20/2021   SUBJECTIVE:  SUBJECTIVE STATEMENT: I am not feeling the pressure in the pelvic floor as much.  I am not able to hold contraction for 10 seconds.    PAIN:  Are you having pain? has not noticed it NPRS scale: 0/10 Pain location: Anterior lower abdominal area   Pain type: aching Pain description: intermittent    Aggravating factors: when she gets up in the morning Relieving factors: get up and walk around   PRECAUTIONS: None   WEIGHT BEARING RESTRICTIONS No   FALLS:  Has patient fallen in last 6 months? No   LIVING ENVIRONMENT: Lives with: lives with their spouse   OCCUPATION: retired Education officer, museum   PLOF: Independent   PATIENT GOALS reduce prolapse, control her gas   PERTINENT HISTORY:  Osteopenia; Abdominal hysterectomy 2014; Gall bladder ; Anterior cervical disectomy fusion 1/6/21surgery     BOWEL MOVEMENT Pain with bowel  movement: No Type of bowel movement:Type (Bristol Stool Scale) firt bit is Type 1 then type 4, Frequency 2 x per day, and Strain No Fully empty rectum: Yes:   Leakage: No   URINATION Pain with urination: No Fully empty bladder: Yes: sits longer than feels a little comes out Stream: Strong Urgency: Yes: has to grab herself , when she hears water running, when she gets home from the store Frequency: urinate every 2-3 hours Leakage: Coughing and Sneezing as she walks to the bathroom Pads: Yes: 1 during day   INTERCOURSE Pain with intercourse: Initial Penetration and During Penetration using toys Ability to have vaginal penetration:  Yes:   Climax: very rarely Marinoff Scale: 1/3   PREGNANCY Vaginal deliveries first one was breech and needed to do surgery to remove the baby, 1 live birth   Currently pregnant No   PROLAPSE Cystocele comes out with straining and end of day.        OBJECTIVE:    DIAGNOSTIC FINDINGS:  none       COGNITION:            Overall cognitive status: Within functional limits for tasks assessed                          SENSATION:            Light touch: Appears intact            Proprioception: Appears intact                  POSTURE: No Significant postural limitations               PELVIC ALIGNMENT: Correct alignment   LUMBARAROM/PROM   A/PROM A/PROM  eval  Flexion full  Extension Decreased by 50%  Right lateral flexion Decreased by 25%  Left lateral flexion Decreased by 25%  Right rotation Decreased by 25%  Left rotation Decreased by 25%   (Blank rows = not tested)   LOWER EXTREMITY ROM:   Passive ROM Right eval Left eval  Hip external rotation 50 50   (Blank rows = not tested)   LOWER EXTREMITY MMT:   MMT Right eval Left eval  Hip abduction 4/5       PALPATION:   General  lower abdominal does not contract and the upper abdominal contracts the most.                  External Perineal Exam redness around the vulvar area  and introitus  Internal Pelvic Floor tightness on the left iliococcygeus and tenderness   Patient confirms identification and approves PT to assess internal pelvic floor and treatment Yes   PELVIC MMT:   MMT eval  Vaginal 3/5 with small lift  (Blank rows = not tested)         TONE: good   PROLAPSE: Anterior wall weakness that comes just out of of the introitus   TODAY'S TREATMENT  12/29/2021 Manual: Soft tissue mobilization:manual work to diaphragm with breath. Manual work on the rectus midline, manual work on the obliques Myofascial release:tissue rolling of the abdominal especially in the midline Neuromuscular re-education: Core facilitation:supine hip flexion isometric holding 5 sed 10x each side Sup[ine bridge with ball squeeze 10x Down training:diaphragmatic breathing with yellow band around the lower rib cage and she is breathing into it.  Self-care: Reviewed vaginal moisturizers and she perfers to only do for the outside and not internally. She has not started yet.     EVAL Date: 12/21/2021 HEP established-see below      PATIENT EDUCATION:  12/29/2021 Education details: Access Code: [email or web address]; Person educated: Patient Education method: Explanation, Demonstration, Tactile cues, Verbal cues, and Handouts Education comprehension: verbalized understanding, returned demonstration, verbal cues required, tactile cues required, and needs further education     HOME EXERCISE PROGRAM: 12/29/2021  Access Code: [email or web address] URL: [email or web address] Date: 12/29/2021 Prepared by: Earlie Counts  Exercises - Supine Pelvic Floor Contraction  - 3 x daily - 7 x weekly - 1 sets - 10 reps - 5 sec hold - Supine Diaphragmatic Breathing  - 1 x daily - 7 x weekly - 1 sets - 10 reps - Hooklying Isometric Hip Flexion  - 1 x daily - 7 x weekly - 1 sets - 10 reps - 5 sec hold - Supine Bridge with Mini Swiss Ball Between Knees  - 1 x daily - 7 x weekly -  2 sets - 10 reps ASSESSMENT:   CLINICAL IMPRESSION: Patient is a 76 y.o. female  who was seen today for physical therapy treatment for cystocele. Patient has not noticed pain in the lower abdominal area since last visit. Patient was able to perform diaphragmatic breathing with expanding the lower rib cage. She is not using her vaginal moisturizers yet but when she does prefers to only use externally. Patient will benefit skilled therapy to improve pelvic floor strength and coordination while being educated on prolapse management.      OBJECTIVE IMPAIRMENTS decreased coordination, decreased endurance, decreased ROM, decreased strength, increased fascial restrictions, and pain.    ACTIVITY LIMITATIONS carrying, lifting, bending, sitting, standing, squatting, and transfers   PARTICIPATION LIMITATIONS: cleaning, laundry, and community activity   PERSONAL FACTORS Age, Fitness, Past/current experiences, and 1-2 comorbidities:    Osteopenia; Abdominal hysterectomy 2014; Gall bladder ; Anterior cervical disectomy fusion 1/6/21surgeryare also affecting patient's functional outcome.    REHAB POTENTIAL: Good   CLINICAL DECISION MAKING: Evolving/moderate complexity   EVALUATION COMPLEXITY: Moderate     GOALS: Goals reviewed with patient? Yes   SHORT TERM GOALS: Target date: 01/18/2022   Patient independent with her intial program for pelvic floor strength and lower abdominal strength Baseline: Goal status: INITIAL   2.  Lower abdominal pain decreased >/= 50%  since initial eval due to improved strength in the pelvic floor.  Baseline:  Goal status: INITIAL   3.  Patient was educated on vaginal moisturizers to use to improve the health of the vaginal tissue. Baseline:  Goal status: Met 12/29/2021  LONG TERM GOALS: Target date: 03/15/2022    Patient is independent with her pelvic floor and core exercises.  Baseline:  Goal status: INITIAL   2.  Patient understands pressure management  with daily activities to reduce the strain on the pelvic floor. Baseline:  Goal status: INITIAL   3.  Patient feels her prolapse improved >/= 50% with using the prolapse management techniques and increasing pelvic floor strength.  Baseline:  Goal status: INITIAL   4.  Patient reports she is straining 50% less with correct toileting techniques and to reduce the pressure on her cystocele.  Baseline:  Goal status: INITIAL       PLAN: PT FREQUENCY: 1x/week   PT DURATION: 12 weeks   PLANNED INTERVENTIONS: Therapeutic exercises, Therapeutic activity, Neuromuscular re-education, Patient/Family education, Self Care, Electrical stimulation, Cryotherapy, Moist heat, Biofeedback, and Manual therapy   PLAN FOR NEXT SESSION:  supine hip abduction, manual work to the abdomen to work on equal contraction of upper and lower; sitting pelvic floor contraction    Earlie Counts, PT 12/29/21 9:33 AM

## 2022-01-05 ENCOUNTER — Other Ambulatory Visit: Payer: Self-pay | Admitting: Internal Medicine

## 2022-01-05 ENCOUNTER — Encounter: Payer: Medicare Other | Admitting: Physical Therapy

## 2022-01-05 ENCOUNTER — Encounter: Payer: Self-pay | Admitting: Physical Therapy

## 2022-01-05 DIAGNOSIS — M6281 Muscle weakness (generalized): Secondary | ICD-10-CM

## 2022-01-05 DIAGNOSIS — N8110 Cystocele, unspecified: Secondary | ICD-10-CM | POA: Diagnosis not present

## 2022-01-05 NOTE — Therapy (Signed)
OUTPATIENT PHYSICAL THERAPY TREATMENT NOTE   Patient Name: Morgan Hancock MRN: [id number] DOB:1945-11-15, 76 y.o., female Today's Date: 01/05/2022  PCP: Binnie Rail, MD REFERRING PROVIDER: Dian Queen, MD  END OF SESSION:   PT End of Session - 01/05/22 0930     Visit Number 3    Date for PT Re-Evaluation 03/15/22    Authorization Type [employer] medicare    Authorization - Visit Number 3    Authorization - Number of Visits 10    PT Start Time 0930    PT Stop Time 1100    PT Time Calculation (min) 45 min    Activity Tolerance Patient tolerated treatment well    Behavior During Therapy [facility] for tasks assessed/performed             Past Medical History:  Diagnosis Date   Cataract    Colon polyps 06/18/2013   Fatty liver    Hx of adenomatous colonic polyps 06/18/2013   Overview:  Had colonoscopy 06/18/2013 added new dx of colon polyps and Diverticuiosis    Hyperlipidemia    Hypertension    Osteopenia after menopause 2019   Past Surgical History:  Procedure Laterality Date   ABDOMINAL HYSTERECTOMY     prolapse   ANTERIOR CERVICAL DECOMP/DISCECTOMY FUSION N/A 05/14/2019   Procedure: ANTERIOR CERVICAL DECOMPRESSION/DISCECTOMY FUSION CERVICAL FIVE- CERVICAL SIX, CERVICAL SIX- CERVICAL SEVEN, CERVICAL SEVEN- THORACIC ONE;  Surgeon: Jovita Gamma, MD;  Location: [facility];  Service: Neurosurgery;  Laterality: N/A;  ANTERIOR CERVICAL DECOMPRESSION/DISCECTOMY FUSION CERVICAL 5- CERVICAL 6, CERVICAL 6- CERVICAL 7, CERVICAL 7- THORACIC 1   CHOLECYSTECTOMY     COLONOSCOPY  06/18/2013   COLONOSCOPY WITH PROPOFOL  07/18/2018   Dr.Gessner   GALLBLADDER SURGERY  07/06/2012   TONSILLECTOMY  03/12/1952   TUBAL LIGATION     Patient Active Problem List   Diagnosis Date Noted   Abnormal urine odor 06/24/2021   Viral upper respiratory tract infection 06/24/2021   Neck pain on left side 02/21/2021   Episodic lightheadedness 02/21/2021   Herpes zoster without complication 34/96/1164    Nausea 09/24/2019   HNP (herniated nucleus pulposus), cervical 05/14/2019   Carotid arterial disease ([place]) 06/13/2018   Rectal bleeding 06/13/2018   Prediabetes 12/20/2017   Hair thinning 12/18/2017   Atherosclerosis of aorta ([place]) 04/29/2017   Right lower quadrant abdominal pain 04/18/2017   Fibrocystic breast disease 11/20/2015   Osteopenia, high frax 10/29/2015   HLD (hyperlipidemia) 10/28/2015   Fatty liver 10/28/2015   Vitamin D deficiency 10/28/2015   Cataract 10/28/2015   DD (diverticular disease) 06/18/2013   Hx of adenomatous colonic polyps 06/18/2013   Hypertension 05/27/2013   REFERRING PROVIDER: Dian Queen, MD   REFERRING DIAG: N81.10 (ICD-10-CM) - Cystocele, unspecified   THERAPY DIAG:  Muscle weakness (generalized)   Cystocele, unspecified   Rationale for Evaluation and Treatment Rehabilitation   ONSET DATE: 11/20/2021   SUBJECTIVE:  SUBJECTIVE STATEMENT: I am doing fine. I still have gas issues. My abdominal muscles were sore but the overall pain is better.    PAIN:  Are you having pain? has not noticed it NPRS scale: 0/10 Pain location: Anterior lower abdominal area   Pain type: aching Pain description: intermittent    Aggravating factors: when she gets up in the morning Relieving factors: get up and walk around   PRECAUTIONS: None   WEIGHT BEARING RESTRICTIONS No   FALLS:  Has patient fallen in last 6 months? No   LIVING ENVIRONMENT: Lives with: lives with their spouse   OCCUPATION: retired Education officer, museum   PLOF: Independent   PATIENT GOALS reduce prolapse, control her gas   PERTINENT HISTORY:  Osteopenia; Abdominal hysterectomy 2014; Gall bladder ; Anterior cervical disectomy fusion 1/6/21surgery     BOWEL MOVEMENT Pain with bowel movement:  No Type of bowel movement:Type (Bristol Stool Scale) firt bit is Type 1 then type 4, Frequency 2 x per day, and Strain No Fully empty rectum: Yes:   Leakage: No   URINATION Pain with urination: No Fully empty bladder: Yes: sits longer than feels a little comes out Stream: Strong Urgency: Yes: has to grab herself , when she hears water running, when she gets home from the store Frequency: urinate every 2-3 hours Leakage: Coughing and Sneezing as she walks to the bathroom Pads: Yes: 1 during day   INTERCOURSE Pain with intercourse: Initial Penetration and During Penetration using toys Ability to have vaginal penetration:  Yes:   Climax: very rarely Marinoff Scale: 1/3   PREGNANCY Vaginal deliveries first one was breech and needed to do surgery to remove the baby, 1 live birth   Currently pregnant No   PROLAPSE Cystocele comes out with straining and end of day.        OBJECTIVE:    DIAGNOSTIC FINDINGS:  none       COGNITION:            Overall cognitive status: Within functional limits for tasks assessed                          SENSATION:            Light touch: Appears intact            Proprioception: Appears intact                  POSTURE: No Significant postural limitations               PELVIC ALIGNMENT: Correct alignment   LUMBARAROM/PROM   A/PROM A/PROM  eval  Flexion full  Extension Decreased by 50%  Right lateral flexion Decreased by 25%  Left lateral flexion Decreased by 25%  Right rotation Decreased by 25%  Left rotation Decreased by 25%   (Blank rows = not tested)   LOWER EXTREMITY ROM:   Passive ROM Right eval Left eval  Hip external rotation 50 50   (Blank rows = not tested)   LOWER EXTREMITY MMT:   MMT Right eval Left eval  Hip abduction 4/5       PALPATION:   General  lower abdominal does not contract and the upper abdominal contracts the most.                  External Perineal Exam redness around the vulvar area and  introitus  Internal Pelvic Floor tightness on the left iliococcygeus and tenderness   Patient confirms identification and approves PT to assess internal pelvic floor and treatment Yes   PELVIC MMT:   MMT eval  Vaginal 3/5 with small lift  (Blank rows = not tested)         TONE: good   PROLAPSE: Anterior wall weakness that comes just out of of the introitus   TODAY'S TREATMENT  01/05/2022 Manual: Soft tissue mobilization:manual work to the diaphragm and the upper abdomen Myofascial release:tissue rolling of the abdomen especially in the midline Neuromuscular re-education: Core retraining: abdominal contraction with tactile cues to brace upper and lower without bulging the lower Supine hip flexion isometric holding 5 sec 10x each side with pelvic floor contraction Bridge with ball and pelvic floor contraction 15x Supine ball squeeze alternat shoulder flexion with 1# in each hand 20x Sitting pelvic floor contraction 10x with ball and 10x without Core facilitation: Form correction: Pelvic floor contraction training: Down training: Exercises: Stretches/mobility: Strengthening: Therapeutic activities: Functional strengthening activities:educated patient on how to toilet correctly with feet elevated, correct breathing, and pushing the stool out.  Self-care:     12/29/2021 Manual: Soft tissue mobilization:manual work to diaphragm with breath. Manual work on the rectus midline, manual work on the obliques Myofascial release:tissue rolling of the abdominal especially in the midline Neuromuscular re-education: Core facilitation:supine hip flexion isometric holding 5 sed 10x each side Sup[ine bridge with ball squeeze 10x Down training:diaphragmatic breathing with yellow band around the lower rib cage and she is breathing into it.  Self-care: Reviewed vaginal moisturizers and she perfers to only do for the outside and not internally. She has not started  yet.     EVAL Date: 12/21/2021 HEP established-see below      PATIENT EDUCATION:  01/04/2022 Education details: Access Code: [email or web address]; Person educated: Patient Education method: Explanation, Demonstration, Tactile cues, Verbal cues, and Handouts Education comprehension: verbalized understanding, returned demonstration, verbal cues required, tactile cues required, and needs further education     HOME EXERCISE PROGRAM: 01/05/2022  Access Code: [email or web address] URL: [email or web address] Date: 01/05/2022 Prepared by: Earlie Counts  Exercises - Supine Diaphragmatic Breathing  - 1 x daily - 7 x weekly - 1 sets - 10 reps - Hooklying Isometric Hip Flexion  - 1 x daily - 7 x weekly - 1 sets - 10 reps - 5 sec hold - Supine Bridge with Mini Swiss Ball Between Knees  - 1 x daily - 7 x weekly - 2 sets - 10 reps - Seated Pelvic Floor Contraction  - 3 x daily - 7 x weekly - 1 sets - 10 reps - 5 sec hold   ASSESSMENT:   CLINICAL IMPRESSION: Patient is a 76 y.o. female  who was seen today for physical therapy treatment for cystocele. Patient reports her pressure feeling decreased by 40%. Patient is able to contract her lower and upper abdominals together without bulging the lower abdominals. Patient was educated on how to toilet correctly to assist with her straining. Patient will benefit skilled therapy to improve pelvic floor strength and coordination while being educated on prolapse management.      OBJECTIVE IMPAIRMENTS decreased coordination, decreased endurance, decreased ROM, decreased strength, increased fascial restrictions, and pain.    ACTIVITY LIMITATIONS carrying, lifting, bending, sitting, standing, squatting, and transfers   PARTICIPATION LIMITATIONS: cleaning, laundry, and community activity   PERSONAL FACTORS Age, Fitness, Past/current experiences, and 1-2 comorbidities:    Osteopenia; Abdominal hysterectomy 2014; Gall bladder ; Anterior cervical  disectomy fusion  1/6/21surgeryare also affecting patient's functional outcome.    REHAB POTENTIAL: Good   CLINICAL DECISION MAKING: Evolving/moderate complexity   EVALUATION COMPLEXITY: Moderate     GOALS: Goals reviewed with patient? Yes   SHORT TERM GOALS: Target date: 01/18/2022   Patient independent with her intial program for pelvic floor strength and lower abdominal strength Baseline: Goal status: Met 01/05/2022   2.  Lower abdominal pain decreased >/= 50%  since initial eval due to improved strength in the pelvic floor.  Baseline:  Goal status: INITIAL   3.  Patient was educated on vaginal moisturizers to use to improve the health of the vaginal tissue. Baseline:  Goal status: Met 12/29/2021     LONG TERM GOALS: Target date: 03/15/2022    Patient is independent with her pelvic floor and core exercises.  Baseline:  Goal status: INITIAL   2.  Patient understands pressure management with daily activities to reduce the strain on the pelvic floor. Baseline:  Goal status: INITIAL   3.  Patient feels her prolapse improved >/= 50% with using the prolapse management techniques and increasing pelvic floor strength.  Baseline:  Goal status: INITIAL   4.  Patient reports she is straining 50% less with correct toileting techniques and to reduce the pressure on her cystocele.  Baseline:  Goal status: INITIAL       PLAN: PT FREQUENCY: 1x/week   PT DURATION: 12 weeks   PLANNED INTERVENTIONS: Therapeutic exercises, Therapeutic activity, Neuromuscular re-education, Patient/Family education, Self Care, Electrical stimulation, Cryotherapy, Moist heat, Biofeedback, and Manual therapy   PLAN FOR NEXT SESSION:  supine hip abduction, supine dead bug, quadruped ex  Earlie Counts, PT 01/05/22 10:25 AM

## 2022-01-12 ENCOUNTER — Encounter: Payer: Self-pay | Admitting: Physical Therapy

## 2022-01-12 ENCOUNTER — Encounter: Payer: Medicare Other | Attending: Obstetrics and Gynecology | Admitting: Physical Therapy

## 2022-01-12 DIAGNOSIS — N8110 Cystocele, unspecified: Secondary | ICD-10-CM | POA: Diagnosis not present

## 2022-01-12 DIAGNOSIS — M6281 Muscle weakness (generalized): Secondary | ICD-10-CM | POA: Insufficient documentation

## 2022-01-12 DIAGNOSIS — E785 Hyperlipidemia, unspecified: Secondary | ICD-10-CM | POA: Diagnosis not present

## 2022-01-12 DIAGNOSIS — I10 Essential (primary) hypertension: Secondary | ICD-10-CM | POA: Diagnosis not present

## 2022-01-12 DIAGNOSIS — M8580 Other specified disorders of bone density and structure, unspecified site: Secondary | ICD-10-CM | POA: Diagnosis not present

## 2022-01-12 NOTE — Patient Instructions (Signed)
Urge Incontinence  Ideal urination frequency is every 2-4 wakeful hours, which equates to 5-8 times within a 24-hour period.   Urge incontinence is leakage that occurs when the bladder muscle contracts, creating a sudden need to go before getting to the bathroom.   Going too often when your bladder isn't actually full can disrupt the body's automatic signals to store and hold urine longer, which will increase urgency/frequency.  In this case, the bladder "is running the show" and strategies can be learned to retrain this pattern.   One should be able to control the first urge to urinate, at around 144m.  The bladder can hold up to a "grande latte," or 4089m To help you gain control, practice the Urge Drill below when urgency strikes.  This drill will help retrain your bladder signals and allow you to store and hold urine longer.  The overall goal is to stretch out your time between voids to reach a more manageable voiding schedule.    Practice your "quick flicks" often throughout the day (each waking hour) even when you don't need feel the urge to go.  This will help strengthen your pelvic floor muscles, making them more effective in controlling leakage.  Urge Drill  When you feel an urge to go, follow these steps to regain control: Stop what you are doing and be still Take one deep breath, directing your air into your abdomen Think an affirming thought, such as "I've got this." Do 5 quick flicks of your pelvic floor Move heels up and down in standing or sitting Walk with control to the bathroom to void, or delay voiding If the urge happens as you are walking to the bathroom then do the above again ChEarlie CountsPT Women's [id number] CourtSuAlvaradoNC [id number]: [email or web address]

## 2022-01-12 NOTE — Therapy (Signed)
OUTPATIENT PHYSICAL THERAPY TREATMENT NOTE   Patient Name: Morgan Hancock MRN: [id number] DOB:[birth year], 76 y.o., female Today's Date: 01/12/2022  PCP: Binnie Rail, MD REFERRING PROVIDER: Dian Queen, MD  END OF SESSION:   PT End of Session - 01/12/22 0921     Visit Number 4    Date for PT Re-Evaluation 03/15/22    Authorization Type [employer] medicare    Authorization - Visit Number 4    Authorization - Number of Visits 10    PT Start Time 0921    PT Stop Time 1010    PT Time Calculation (min) 49 min    Activity Tolerance Patient tolerated treatment well    Behavior During Therapy [facility] for tasks assessed/performed             Past Medical History:  Diagnosis Date   Cataract    Colon polyps 06/18/2013   Fatty liver    Hx of adenomatous colonic polyps 06/18/2013   Overview:  Had colonoscopy 06/18/2013 added new dx of colon polyps and Diverticuiosis    Hyperlipidemia    Hypertension    Osteopenia after menopause 2019   Past Surgical History:  Procedure Laterality Date   ABDOMINAL HYSTERECTOMY     prolapse   ANTERIOR CERVICAL DECOMP/DISCECTOMY FUSION N/A 05/14/2019   Procedure: ANTERIOR CERVICAL DECOMPRESSION/DISCECTOMY FUSION CERVICAL FIVE- CERVICAL SIX, CERVICAL SIX- CERVICAL SEVEN, CERVICAL SEVEN- THORACIC ONE;  Surgeon: Jovita Gamma, MD;  Location: [facility];  Service: Neurosurgery;  Laterality: N/A;  ANTERIOR CERVICAL DECOMPRESSION/DISCECTOMY FUSION CERVICAL 5- CERVICAL 6, CERVICAL 6- CERVICAL 7, CERVICAL 7- THORACIC 1   CHOLECYSTECTOMY     COLONOSCOPY  06/18/2013   COLONOSCOPY WITH PROPOFOL  07/18/2018   Dr.Gessner   GALLBLADDER SURGERY  07/06/2012   TONSILLECTOMY  03/12/1952   TUBAL LIGATION     Patient Active Problem List   Diagnosis Date Noted   Abnormal urine odor 06/24/2021   Viral upper respiratory tract infection 06/24/2021   Neck pain on left side 02/21/2021   Episodic lightheadedness 02/21/2021   Herpes zoster without complication 56/38/9373    Nausea 09/24/2019   HNP (herniated nucleus pulposus), cervical 05/14/2019   Carotid arterial disease ([place]) 06/13/2018   Rectal bleeding 06/13/2018   Prediabetes 12/20/2017   Hair thinning 12/18/2017   Atherosclerosis of aorta ([place]) 04/29/2017   Right lower quadrant abdominal pain 04/18/2017   Fibrocystic breast disease 11/20/2015   Osteopenia, high frax 10/29/2015   HLD (hyperlipidemia) 10/28/2015   Fatty liver 10/28/2015   Vitamin D deficiency 10/28/2015   Cataract 10/28/2015   DD (diverticular disease) 06/18/2013   Hx of adenomatous colonic polyps 06/18/2013   Hypertension 05/27/2013   REFERRING PROVIDER: Dian Queen, MD   REFERRING DIAG: N81.10 (ICD-10-CM) - Cystocele, unspecified   THERAPY DIAG:  Muscle weakness (generalized)   Cystocele, unspecified   Rationale for Evaluation and Treatment Rehabilitation   ONSET DATE: 11/20/2021   SUBJECTIVE:  SUBJECTIVE STATEMENT: My abdominal muscles are not sore now. I will leak with water running, sneeze. I have urgency with the bowels at times. Trouble controlling her gas.   PAIN:  Are you having pain? has not noticed it NPRS scale: 0/10 Pain location: Anterior lower abdominal area   Pain type: aching Pain description: intermittent    Aggravating factors: when she gets up in the morning Relieving factors: get up and walk around   PRECAUTIONS: None   WEIGHT BEARING RESTRICTIONS No   FALLS:  Has patient fallen in last 6 months? No   LIVING ENVIRONMENT: Lives with: lives with their spouse   OCCUPATION: retired Education officer, [employer]   PLOF: Independent   PATIENT GOALS reduce prolapse, control her gas   PERTINENT HISTORY:  Osteopenia; Abdominal hysterectomy 2014; Gall bladder ; Anterior cervical disectomy fusion 1/6/21surgery      BOWEL MOVEMENT Pain with bowel movement: No Type of bowel movement:Type (Bristol Stool Scale) firt bit is Type 1 then type 4, Frequency 2 x per day, and Strain No Fully empty rectum: Yes:   Leakage: No   URINATION Pain with urination: No Fully empty bladder: Yes: sits longer than feels a little comes out Stream: Strong Urgency: Yes: has to grab herself , when she hears water running, when she gets home from the store Frequency: urinate every 2-3 hours Leakage: Coughing and Sneezing as she walks to the bathroom Pads: Yes: 1 during day   INTERCOURSE Pain with intercourse: Initial Penetration and During Penetration using toys Ability to have vaginal penetration:  Yes:   Climax: very rarely Marinoff Scale: 1/3   PREGNANCY Vaginal deliveries first one was breech and needed to do surgery to remove the baby, 1 live birth   Currently pregnant No   PROLAPSE Cystocele comes out with straining and end of day.        OBJECTIVE:    DIAGNOSTIC FINDINGS:  none       COGNITION:            Overall cognitive status: Within functional limits for tasks assessed                          SENSATION:            Light touch: Appears intact            Proprioception: Appears intact                  POSTURE: No Significant postural limitations               PELVIC ALIGNMENT: Correct alignment   LUMBARAROM/PROM   A/PROM A/PROM  eval  Flexion full  Extension Decreased by 50%  Right lateral flexion Decreased by 25%  Left lateral flexion Decreased by 25%  Right rotation Decreased by 25%  Left rotation Decreased by 25%   (Blank rows = not tested)   LOWER EXTREMITY ROM:   Passive ROM Right eval Left eval  Hip external rotation 50 50   (Blank rows = not tested)   LOWER EXTREMITY MMT:   MMT Right eval Left eval  Hip abduction 4/5       PALPATION:   General  lower abdominal does not contract and the upper abdominal contracts the most.                  External Perineal  Exam redness around the vulvar area and introitus  Internal Pelvic Floor tightness on the left iliococcygeus and tenderness   Patient confirms identification and approves PT to assess internal pelvic floor and treatment Yes   PELVIC MMT:   MMT eval  Vaginal 3/5 with small lift  (Blank rows = not tested)         TONE: good   PROLAPSE: Anterior wall weakness that comes just out of of the introitus   TODAY'S TREATMENT  01/12/2022 Neuromuscular re-education: Pelvic floor contraction training:supine contract the anus for 10 seconds 10 times  Contraction of the vagina for 10 seconds 10x supine Hip flexion isometric with other leg moving up and down with pelvic floor contraction 10x 2 each side Bridge with red band around knees with pelvic floor contraction moveing arms up and down holding 1# weight.  Quadruped lift one extremity 10x with pelvic floor contraction and not moving body.   Self-care: Educated patient to lay down to reduce her prolapse for 10 minutes daily for pressure management Educated patient on the urge to void stool and urine to be able to walk to the commode slowly    01/05/2022 Manual: Soft tissue mobilization:manual work to the diaphragm and the upper abdomen Myofascial release:tissue rolling of the abdomen especially in the midline Neuromuscular re-education: Core retraining: abdominal contraction with tactile cues to brace upper and lower without bulging the lower Supine hip flexion isometric holding 5 sec 10x each side with pelvic floor contraction Bridge with ball and pelvic floor contraction 15x Supine ball squeeze alternat shoulder flexion with 1# in each hand 20x Sitting pelvic floor contraction 10x with ball and 10x without Therapeutic activities: Functional strengthening activities:educated patient on how to toilet correctly with feet elevated, correct breathing, and pushing the stool out.  Self-care:       12/29/2021 Manual: Soft tissue mobilization:manual work to diaphragm with breath. Manual work on the rectus midline, manual work on the obliques Myofascial release:tissue rolling of the abdominal especially in the midline Neuromuscular re-education: Core facilitation:supine hip flexion isometric holding 5 sed 10x each side Sup[ine bridge with ball squeeze 10x Down training:diaphragmatic breathing with yellow band around the lower rib cage and she is breathing into it.  Self-care: Reviewed vaginal moisturizers and she perfers to only do for the outside and not internally. She has not started yet.     PATIENT EDUCATION:  01/12/2022 Education details: Access Code: [email or web address];, urge to void for urine and stool Person educated: Patient Education method: Explanation, Demonstration, Tactile cues, Verbal cues, and Handouts Education comprehension: verbalized understanding, returned demonstration, verbal cues required, tactile cues required, and needs further education     HOME EXERCISE PROGRAM:                 01/12/2022  Access Code: [email or web address] URL: [email or web address] Date: 01/12/2022 Prepared by: Earlie Counts  Program Notes lay down with feet elevated 10 minutes per day to reduce your prolapse and when you have pressure.   Exercises - Supine Diaphragmatic Breathing  - 1 x daily - 7 x weekly - 1 sets - 10 reps - Hooklying Isometric Hip Flexion  - 1 x daily - 7 x weekly - 1 sets - 10 reps - 5 sec hold - Seated Pelvic Floor Contraction  - 3 x daily - 7 x weekly - 1 sets - 10 reps - 5 sec hold - Supine Pelvic Floor Contraction  - 1 x daily - 7 x weekly - 1 sets - 10 reps - 10 sec hold - Supine Bridging with Alternating Band  Pulls with Resistance at Head  - 1 x daily - 7 x weekly - 2 sets - 10 reps - Quadruped Exhale with Pelvic Floor Contraction and Arm Raise  - 1 x daily - 7 x weekly - 1 sets - 10 reps - Quadruped Exhale with Pelvic Floor Contraction and Leg Extension  - 1 x daily  - 7 x weekly - 1 sets - 10 reps   ASSESSMENT:   CLINICAL IMPRESSION: Patient is a 76 y.o. female  who was seen today for physical therapy treatment for cystocele. Lower abdominal pain decreased by 50%. Patient is not noticing her prolapse as  much. She will notice it with pressure and has no pain with it. No straining with bowel movements. Patient is able to feel herself contract the anus for 10 seconds but feels it let go and has to engage the muscles again. Patient was ready for advanced HEP and was able to contract the pelvic floor while keeping the core engaged. Patient will benefit skilled therapy to improve pelvic floor strength and coordination while being educated on prolapse management.      OBJECTIVE IMPAIRMENTS decreased coordination, decreased endurance, decreased ROM, decreased strength, increased fascial restrictions, and pain.    ACTIVITY LIMITATIONS carrying, lifting, bending, sitting, standing, squatting, and transfers   PARTICIPATION LIMITATIONS: cleaning, laundry, and community activity   PERSONAL FACTORS Age, Fitness, Past/current experiences, and 1-2 comorbidities:    Osteopenia; Abdominal hysterectomy 2014; Gall bladder ; Anterior cervical disectomy fusion 1/6/21surgeryare also affecting patient's functional outcome.    REHAB POTENTIAL: Good   CLINICAL DECISION MAKING: Evolving/moderate complexity   EVALUATION COMPLEXITY: Moderate     GOALS: Goals reviewed with patient? Yes   SHORT TERM GOALS: Target date: 01/18/2022   Patient independent with her intial program for pelvic floor strength and lower abdominal strength Baseline: Goal status: Met 01/05/2022   2.  Lower abdominal pain decreased >/= 50%  since initial eval due to improved strength in the pelvic floor.  Baseline:  Goal status: Met 01/12/2022   3.  Patient was educated on vaginal moisturizers to use to improve the health of the vaginal tissue. Baseline:  Goal status: Met 12/29/2021     LONG TERM  GOALS: Target date: 03/15/2022    Patient is independent with her pelvic floor and core exercises.  Baseline:  Goal status: INITIAL   2.  Patient understands pressure management with daily activities to reduce the strain on the pelvic floor. Baseline:  Goal status: INITIAL   3.  Patient feels her prolapse improved >/= 50% with using the prolapse management techniques and increasing pelvic floor strength.  Baseline:  Goal status: ongoing 01/12/2022   4.  Patient reports she is straining 50% less with correct toileting techniques and to reduce the pressure on her cystocele.  Baseline:  Goal status: Met 01/12/2022       PLAN: PT FREQUENCY: 1x/week   PT DURATION: 12 weeks   PLANNED INTERVENTIONS: Therapeutic exercises, Therapeutic activity, Neuromuscular re-education, Patient/Family education, Self Care, Electrical stimulation, Cryotherapy, Moist heat, Biofeedback, and Manual therapy   PLAN FOR NEXT SESSION:  review HEP and advance, check on urge to void Earlie Counts, PT 01/12/22 10:11 AM

## 2022-01-26 ENCOUNTER — Encounter: Payer: Self-pay | Admitting: Physical Therapy

## 2022-01-26 ENCOUNTER — Encounter: Payer: Medicare Other | Admitting: Physical Therapy

## 2022-01-26 DIAGNOSIS — N8110 Cystocele, unspecified: Secondary | ICD-10-CM

## 2022-01-26 DIAGNOSIS — E785 Hyperlipidemia, unspecified: Secondary | ICD-10-CM | POA: Diagnosis not present

## 2022-01-26 DIAGNOSIS — M8580 Other specified disorders of bone density and structure, unspecified site: Secondary | ICD-10-CM | POA: Diagnosis not present

## 2022-01-26 DIAGNOSIS — M6281 Muscle weakness (generalized): Secondary | ICD-10-CM | POA: Diagnosis not present

## 2022-01-26 DIAGNOSIS — I10 Essential (primary) hypertension: Secondary | ICD-10-CM | POA: Diagnosis not present

## 2022-01-26 NOTE — Therapy (Signed)
OUTPATIENT PHYSICAL THERAPY TREATMENT NOTE   Patient Name: Morgan Hancock MRN: [id number] DOB:[birth year], 76 y.o., female Today's Date: 01/26/2022  PCP: Binnie Rail, MD REFERRING PROVIDER: Dian Queen, MD  END OF SESSION:   PT End of Session - 01/26/22 1303     Visit Number 5    Date for PT Re-Evaluation 03/15/22    Authorization Type [place] - Visit Number 5    Authorization - Number of Visits 10    PT Start Time 7026    PT Stop Time 3785    PT Time Calculation (min) 42 min    Activity Tolerance Patient tolerated treatment well    Behavior During Therapy WFL for tasks assessed/performed             Past Medical History:  Diagnosis Date   Cataract    Colon polyps 06/18/2013   Fatty liver    Hx of adenomatous colonic polyps 06/18/2013   Overview:  Had colonoscopy 06/18/2013 added new dx of colon polyps and Diverticuiosis    Hyperlipidemia    Hypertension    Osteopenia after menopause 2019   Past Surgical History:  Procedure Laterality Date   ABDOMINAL HYSTERECTOMY     prolapse   ANTERIOR CERVICAL DECOMP/DISCECTOMY FUSION N/A 05/14/2019   Procedure: ANTERIOR CERVICAL DECOMPRESSION/DISCECTOMY FUSION CERVICAL FIVE- CERVICAL SIX, CERVICAL SIX- CERVICAL SEVEN, CERVICAL SEVEN- THORACIC ONE;  Surgeon: Jovita Gamma, MD;  Location: [place];  Service: Neurosurgery;  Laterality: N/A;  ANTERIOR CERVICAL DECOMPRESSION/DISCECTOMY FUSION CERVICAL 5- CERVICAL 6, CERVICAL 6- CERVICAL 7, CERVICAL 7- THORACIC 1   CHOLECYSTECTOMY     COLONOSCOPY  06/18/2013   COLONOSCOPY WITH PROPOFOL  07/18/2018   Dr.Gessner   GALLBLADDER SURGERY  07/06/2012   TONSILLECTOMY  03/12/1952   TUBAL LIGATION     Patient Active Problem List   Diagnosis Date Noted   Abnormal urine odor 06/24/2021   Viral upper respiratory tract infection 06/24/2021   Neck pain on left side 02/21/2021   Episodic lightheadedness 02/21/2021   Herpes zoster without complication 88/50/2774    Nausea 09/24/2019   HNP (herniated nucleus pulposus), cervical 05/14/2019   Carotid arterial disease ([place]) 06/13/2018   Rectal bleeding 06/13/2018   Prediabetes 12/20/2017   Hair thinning 12/18/2017   Atherosclerosis of aorta ([place]) 04/29/2017   Right lower quadrant abdominal pain 04/18/2017   Fibrocystic breast disease 11/20/2015   Osteopenia, high frax 10/29/2015   HLD (hyperlipidemia) 10/28/2015   Fatty liver 10/28/2015   Vitamin D deficiency 10/28/2015   Cataract 10/28/2015   DD (diverticular disease) 06/18/2013   Hx of adenomatous colonic polyps 06/18/2013   Hypertension 05/27/2013   REFERRING PROVIDER: Dian Queen, MD   REFERRING DIAG: N81.10 (ICD-10-CM) - Cystocele, unspecified   THERAPY DIAG:  Muscle weakness (generalized)   Cystocele, unspecified   Rationale for Evaluation and Treatment Rehabilitation   ONSET DATE: 11/20/2021   SUBJECTIVE:  SUBJECTIVE STATEMENT: When out in the garden I feel more pressure. I am doing the urge to void drill and it is helping to deter the urge. I am able to get to the bathroom without leaking urine. I now get a muscle pain instead of the other pain. Urinary leakage with coughing and sneezing is 20% better. No pain with intimacy and vaginal penetration.   PAIN:  Are you having pain? has not noticed it NPRS scale: 0/10 Pain location: Anterior lower abdominal area   Pain type: aching Pain description: intermittent    Aggravating factors: when she gets up in the morning Relieving factors: get up and walk around   PRECAUTIONS: None   WEIGHT BEARING RESTRICTIONS No   FALLS:  Has patient fallen in last 6 months? No   LIVING ENVIRONMENT: Lives with: lives with their spouse   OCCUPATION: retired Education officer, [employer]   PLOF: Independent    PATIENT GOALS reduce prolapse, control her gas   PERTINENT HISTORY:  Osteopenia; Abdominal hysterectomy 2014; Gall bladder ; Anterior cervical disectomy fusion 1/6/21surgery     BOWEL MOVEMENT Pain with bowel movement: No Type of bowel movement:Type (Bristol Stool Scale) firt bit is Type 1 then type 4, Frequency 2 x per day, and Strain No Fully empty rectum: Yes:   Leakage: No   URINATION 01/26/2022 Pain with urination: No Fully empty bladder: Yes: sits longer than feels a little comes out Stream: Strong Urgency: No Frequency: urinate every 2-3 hours Leakage: Coughing and Sneezing in standing Pads: Yes: 1 during day   INTERCOURSE Pain with intercourse: Initial Penetration and During Penetration using toys Ability to have vaginal penetration:  Yes:   Climax: very rarely Marinoff Scale: 0/3   PREGNANCY Vaginal deliveries first one was breech and needed to do surgery to remove the baby, 1 live birth   Currently pregnant No   PROLAPSE Cystocele comes out with straining and end of day.        OBJECTIVE:    DIAGNOSTIC FINDINGS:  none       COGNITION:            Overall cognitive status: Within functional limits for tasks assessed                          SENSATION:            Light touch: Appears intact            Proprioception: Appears intact                  POSTURE: No Significant postural limitations               PELVIC ALIGNMENT: Correct alignment   LUMBARAROM/PROM   A/PROM A/PROM  eval  Flexion full  Extension Decreased by 50%  Right lateral flexion Decreased by 25%  Left lateral flexion Decreased by 25%  Right rotation Decreased by 25%  Left rotation Decreased by 25%   (Blank rows = not tested)   LOWER EXTREMITY ROM:   Passive ROM Right eval Left eval  Hip external rotation 50 50   (Blank rows = not tested)   LOWER EXTREMITY MMT:   MMT Right eval Left eval  Hip abduction 4/5       PALPATION:   General  lower abdominal does not  contract and the upper abdominal contracts the most.  External Perineal Exam redness around the vulvar area and introitus                             Internal Pelvic Floor tightness on the left iliococcygeus and tenderness   Patient confirms identification and approves PT to assess internal pelvic floor and treatment Yes   PELVIC MMT:   MMT eval  Vaginal 3/5 with small lift  (Blank rows = not tested)         TONE: good   PROLAPSE: Anterior wall weakness that comes just out of of the introitus   TODAY'S TREATMENT  01/26/2022 Manual: Neuromuscular re-education: Pelvic floor contraction training:standing hip adductor with red band and pelvic floor contraction 2/10 bi.  Walk sideways holding onto red band with pelvic floor contraction 10x each way Therapeutic activities: Functional strengthening activities:squatting with pelvic floor contraction and keeping distance between the rib cage and pubic bone Lifting items keeping the distance between the pubic bone and rib cage Coughing and sneezing with contracting the pelvic floor Pushing lawn mower with breathing and contracting the pelvic floor After gardening to lay on her back to reset prolapse    01/12/2022 Neuromuscular re-education: Pelvic floor contraction training:supine contract the anus for 10 seconds 10 times  Contraction of the vagina for 10 seconds 10x supine Hip flexion isometric with other leg moving up and down with pelvic floor contraction 10x 2 each side Bridge with red band around knees with pelvic floor contraction moveing arms up and down holding 1# weight.  Quadruped lift one extremity 10x with pelvic floor contraction and not moving body.    Self-care: Educated patient to lay down to reduce her prolapse for 10 minutes daily for pressure management Educated patient on the urge to void stool and urine to be able to walk to the commode slowly    01/05/2022 Manual: Soft tissue mobilization:manual  work to the diaphragm and the upper abdomen Myofascial release:tissue rolling of the abdomen especially in the midline Neuromuscular re-education: Core retraining: abdominal contraction with tactile cues to brace upper and lower without bulging the lower Supine hip flexion isometric holding 5 sec 10x each side with pelvic floor contraction Bridge with ball and pelvic floor contraction 15x Supine ball squeeze alternat shoulder flexion with 1# in each hand 20x Sitting pelvic floor contraction 10x with ball and 10x without Therapeutic activities: Functional strengthening activities:educated patient on how to toilet correctly with feet elevated, correct breathing, and pushing the stool out.  Self-care:      12/29/2021 Manual: Soft tissue mobilization:manual work to diaphragm with breath. Manual work on the rectus midline, manual work on the obliques Myofascial release:tissue rolling of the abdominal especially in the midline Neuromuscular re-education: Core facilitation:supine hip flexion isometric holding 5 sed 10x each side Sup[ine bridge with ball squeeze 10x Down training:diaphragmatic breathing with yellow band around the lower rib cage and she is breathing into it.  Self-care: Reviewed vaginal moisturizers and she perfers to only do for the outside and not internally. She has not started yet.     PATIENT EDUCATION:  01/26/2022 Education details: Access Code: [email or web address];, ways to reduce strain on the cystocele; coughing and sneezing with pelvic floor contraction Person educated: Patient Education method: Explanation, Demonstration, Tactile cues, Verbal cues, and Handouts Education comprehension: verbalized understanding, returned demonstration, verbal cues required, tactile cues required, and needs further education     HOME EXERCISE PROGRAM:  01/26/2022  Access Code: [email or web address] URL: [email or web address] Date: 01/26/2022 Prepared by: Earlie Counts  Program  Notes lay down with feet elevated 10 minutes per day to reduce your prolapse and when you have pressure especially after gardening, mowing. prolapse support underwear  Exercises - Standing Hip Adduction with Anchored Resistance  - 1 x daily - 3 x weekly - 2 sets - 10 reps - Sidestepping  - 1 x daily - 3 x weekly - 2 sets - 10 reps    ASSESSMENT:   CLINICAL IMPRESSION: Patient is a 76 y.o. female  who was seen today for physical therapy treatment for cystocele. Patient will leak urine with coughing and sneezing in standing. She is able to walk to the bathroom without leaking due to using the urge to void technique. She now has no pain with vaginal penetration. She is not having abdominal pain. Patient will benefit skilled therapy to improve pelvic floor strength and coordination while being educated on prolapse management.      OBJECTIVE IMPAIRMENTS decreased coordination, decreased endurance, decreased ROM, decreased strength, increased fascial restrictions, and pain.    ACTIVITY LIMITATIONS carrying, lifting, bending, sitting, standing, squatting, and transfers   PARTICIPATION LIMITATIONS: cleaning, laundry, and community activity   PERSONAL FACTORS Age, Fitness, Past/current experiences, and 1-2 comorbidities:    Osteopenia; Abdominal hysterectomy 2014; Gall bladder ; Anterior cervical disectomy fusion 1/6/21surgeryare also affecting patient's functional outcome.    REHAB POTENTIAL: Good   CLINICAL DECISION MAKING: Evolving/moderate complexity   EVALUATION COMPLEXITY: Moderate     GOALS: Goals reviewed with patient? Yes   SHORT TERM GOALS: Target date: 01/18/2022   Patient independent with her intial program for pelvic floor strength and lower abdominal strength Baseline: Goal status: Met 01/05/2022   2.  Lower abdominal pain decreased >/= 50%  since initial eval due to improved strength in the pelvic floor.  Baseline:  Goal status: Met 01/12/2022   3.  Patient was educated on  vaginal moisturizers to use to improve the health of the vaginal tissue. Baseline:  Goal status: Met 12/29/2021     LONG TERM GOALS: Target date: 03/15/2022    Patient is independent with her pelvic floor and core exercises.  Baseline:  Goal status: INITIAL   2.  Patient understands pressure management with daily activities to reduce the strain on the pelvic floor. Baseline:  Goal status: ongoing 01/26/2022   3.  Patient feels her prolapse improved >/= 50% with using the prolapse management techniques and increasing pelvic floor strength.  Baseline:  Goal status: ongoing 01/12/2022   4.  Patient reports she is straining 50% less with correct toileting techniques and to reduce the pressure on her cystocele.  Baseline:  Goal status: Met 01/12/2022       PLAN: PT FREQUENCY: 1x/week   PT DURATION: 12 weeks   PLANNED INTERVENTIONS: Therapeutic exercises, Therapeutic activity, Neuromuscular re-education, Patient/Family education, Self Care, Electrical stimulation, Cryotherapy, Moist heat, Biofeedback, and Manual therapy   PLAN FOR NEXT SESSION:  review HEP and advance, check on sneeze and cough in standing, quick flicks  Earlie Counts, PT 01/26/22 1:58 PM

## 2022-02-02 ENCOUNTER — Encounter: Payer: Medicare Other | Admitting: Physical Therapy

## 2022-02-02 ENCOUNTER — Encounter: Payer: Self-pay | Admitting: Physical Therapy

## 2022-02-02 DIAGNOSIS — I10 Essential (primary) hypertension: Secondary | ICD-10-CM | POA: Diagnosis not present

## 2022-02-02 DIAGNOSIS — M6281 Muscle weakness (generalized): Secondary | ICD-10-CM | POA: Diagnosis not present

## 2022-02-02 DIAGNOSIS — N8110 Cystocele, unspecified: Secondary | ICD-10-CM

## 2022-02-02 DIAGNOSIS — M8580 Other specified disorders of bone density and structure, unspecified site: Secondary | ICD-10-CM | POA: Diagnosis not present

## 2022-02-02 DIAGNOSIS — E785 Hyperlipidemia, unspecified: Secondary | ICD-10-CM | POA: Diagnosis not present

## 2022-02-02 NOTE — Therapy (Signed)
OUTPATIENT PHYSICAL THERAPY TREATMENT NOTE   Patient Name: Morgan Hancock MRN: [id number] DOB:07-09-1945, 76 y.o., female Today's Date: 02/02/2022  PCP: Binnie Rail, MD REFERRING PROVIDER: Dian Queen, MD  END OF SESSION:   PT End of Session - 02/02/22 1402     Visit Number 6    Date for PT Re-Evaluation 03/15/22    Authorization Type [place] - Visit Number 6    Authorization - Number of Visits 10    PT Start Time 1400    PT Stop Time 7096    PT Time Calculation (min) 45 min    Activity Tolerance Patient tolerated treatment well    Behavior During Therapy [facility] for tasks assessed/performed             Past Medical History:  Diagnosis Date   Cataract    Colon polyps 06/18/2013   Fatty liver    Hx of adenomatous colonic polyps 06/18/2013   Overview:  Had colonoscopy 06/18/2013 added new dx of colon polyps and Diverticuiosis    Hyperlipidemia    Hypertension    Osteopenia after menopause 2019   Past Surgical History:  Procedure Laterality Date   ABDOMINAL HYSTERECTOMY     prolapse   ANTERIOR CERVICAL DECOMP/DISCECTOMY FUSION N/A 05/14/2019   Procedure: ANTERIOR CERVICAL DECOMPRESSION/DISCECTOMY FUSION CERVICAL FIVE- CERVICAL SIX, CERVICAL SIX- CERVICAL SEVEN, CERVICAL SEVEN- THORACIC ONE;  Surgeon: Jovita Gamma, MD;  Location: [place];  Service: Neurosurgery;  Laterality: N/A;  ANTERIOR CERVICAL DECOMPRESSION/DISCECTOMY FUSION CERVICAL 5- CERVICAL 6, CERVICAL 6- CERVICAL 7, CERVICAL 7- THORACIC 1   CHOLECYSTECTOMY     COLONOSCOPY  06/18/2013   COLONOSCOPY WITH PROPOFOL  07/18/2018   Dr.Gessner   GALLBLADDER SURGERY  07/06/2012   TONSILLECTOMY  03/12/1952   TUBAL LIGATION     Patient Active Problem List   Diagnosis Date Noted   Abnormal urine odor 06/24/2021   Viral upper respiratory tract infection 06/24/2021   Neck pain on left side 02/21/2021   Episodic lightheadedness 02/21/2021   Herpes zoster without complication 28/36/6294    Nausea 09/24/2019   HNP (herniated nucleus pulposus), cervical 05/14/2019   Carotid arterial disease ([place]) 06/13/2018   Rectal bleeding 06/13/2018   Prediabetes 12/20/2017   Hair thinning 12/18/2017   Atherosclerosis of aorta ([place]) 04/29/2017   Right lower quadrant abdominal pain 04/18/2017   Fibrocystic breast disease 11/20/2015   Osteopenia, high frax 10/29/2015   HLD (hyperlipidemia) 10/28/2015   Fatty liver 10/28/2015   Vitamin D deficiency 10/28/2015   Cataract 10/28/2015   DD (diverticular disease) 06/18/2013   Hx of adenomatous colonic polyps 06/18/2013   Hypertension 05/27/2013   REFERRING DIAG: N81.10 (ICD-10-CM) - Cystocele, unspecified   THERAPY DIAG:  Muscle weakness (generalized)   Cystocele, unspecified   Rationale for Evaluation and Treatment Rehabilitation   ONSET DATE: 11/20/2021   SUBJECTIVE:  SUBJECTIVE STATEMENT: Leaked urine 1-2 times since last week due to sneezing several times.    PAIN:  Are you having pain? has not noticed it NPRS scale: 0/10 Pain location: Anterior lower abdominal area   Pain type: aching Pain description: intermittent    Aggravating factors: when she gets up in the morning Relieving factors: get up and walk around   PRECAUTIONS: None   WEIGHT BEARING RESTRICTIONS No   FALLS:  Has patient fallen in last 6 months? No   LIVING ENVIRONMENT: Lives with: lives with their spouse   OCCUPATION: retired Education officer, [employer]   PLOF: Independent   PATIENT GOALS reduce prolapse, control her gas   PERTINENT HISTORY:  Osteopenia; Abdominal hysterectomy 2014; Gall bladder ; Anterior cervical disectomy fusion 1/6/21surgery     BOWEL MOVEMENT Pain with bowel movement: No Type of bowel movement:Type (Bristol Stool Scale) firt bit is Type 1 then  type 4, Frequency 2 x per day, and Strain No Fully empty rectum: Yes:   Leakage: No   URINATION 01/26/2022 Pain with urination: No Fully empty bladder: Yes: sits longer than feels a little comes out Stream: Strong Urgency: No Frequency: urinate every 2-3 hours Leakage: Coughing and Sneezing in standing Pads: Yes: 1 during day   INTERCOURSE Pain with intercourse: Initial Penetration and During Penetration using toys Ability to have vaginal penetration:  Yes:   Climax: very rarely Marinoff Scale: 0/3   PREGNANCY Vaginal deliveries first one was breech and needed to do surgery to remove the baby, 1 live birth   Currently pregnant No   PROLAPSE Cystocele comes out with straining and end of day.        OBJECTIVE:    DIAGNOSTIC FINDINGS:  none       COGNITION:            Overall cognitive status: Within functional limits for tasks assessed                          SENSATION:            Light touch: Appears intact            Proprioception: Appears intact                  POSTURE: No Significant postural limitations               PELVIC ALIGNMENT: Correct alignment   LUMBARAROM/PROM   A/PROM A/PROM  eval 02/02/2022  Flexion full full  Extension Decreased by 50% Decreased by 25%  Right lateral flexion Decreased by 25% Decreased by 25%  Left lateral flexion Decreased by 25% Decreased by 25%  Right rotation Decreased by 25% Decreased by 25%  Left rotation Decreased by 25% Decreased by 25%   (Blank rows = not tested)   LOWER EXTREMITY ROM:   Passive ROM Right eval Left eval  Hip external rotation 50 50   (Blank rows = not tested)   LOWER EXTREMITY MMT:   MMT Right eval Right  02/02/2022  Hip abduction 4/5 4+/5      PALPATION:   General  lower abdominal does not contract and the upper abdominal contracts the most.                  External Perineal Exam redness around the vulvar area and introitus  Internal Pelvic Floor  tightness on the left iliococcygeus and tenderness   Patient confirms identification and approves PT to assess internal pelvic floor and treatment Yes   PELVIC MMT:   MMT eval  Vaginal 3/5 with small lift  (Blank rows = not tested)         TONE: good   PROLAPSE: Anterior wall weakness that comes just out of of the introitus   TODAY'S TREATMENT  02/02/2022 Neuromuscular re-education: Core retraining: Core facilitation: Form correction: Pelvic floor contraction training:Hip flexion isometric with other leg moving up and down with pelvic floor contraction 10x 2 each side Bridge with red band around knees with pelvic floor contraction moveing arms up and down holding green band Bird dog 10x each side Standing hip abduction with green band 10x each Standing walking sideways holding onto the green band 10x each way Self-care: Educated patient to lay down to reset her prolapse in the middle of the day or after she is feeling it drop.     01/26/2022 Manual: Neuromuscular re-education: Pelvic floor contraction training:standing hip adductor with red band and pelvic floor contraction 2/10 bi.  Walk sideways holding onto red band with pelvic floor contraction 10x each way Therapeutic activities: Functional strengthening activities:squatting with pelvic floor contraction and keeping distance between the rib cage and pubic bone Lifting items keeping the distance between the pubic bone and rib cage Coughing and sneezing with contracting the pelvic floor Pushing lawn mower with breathing and contracting the pelvic floor After gardening to lay on her back to reset prolapse    01/12/2022 Neuromuscular re-education: Pelvic floor contraction training:supine contract the anus for 10 seconds 10 times  Contraction of the vagina for 10 seconds 10x supine Hip flexion isometric with other leg moving up and down with pelvic floor contraction 10x 2 each side Bridge with red band around knees with  pelvic floor contraction moveing arms up and down holding 1# weight.  Quadruped lift one extremity 10x with pelvic floor contraction and not moving body.    Self-care: Educated patient to lay down to reduce her prolapse for 10 minutes daily for pressure management Educated patient on the urge to void stool and urine to be able to walk to the commode slowly    01/05/2022 Manual: Soft tissue mobilization:manual work to the diaphragm and the upper abdomen Myofascial release:tissue rolling of the abdomen especially in the midline Neuromuscular re-education: Core retraining: abdominal contraction with tactile cues to brace upper and lower without bulging the lower Supine hip flexion isometric holding 5 sec 10x each side with pelvic floor contraction Bridge with ball and pelvic floor contraction 15x Supine ball squeeze alternat shoulder flexion with 1# in each hand 20x Sitting pelvic floor contraction 10x with ball and 10x without Therapeutic activities: Functional strengthening activities:educated patient on how to toilet correctly with feet elevated, correct breathing, and pushing the stool out.  Self-care:      PATIENT EDUCATION:  9/81/2023 Education details: Access Code: [email or web address];, ways to reduce strain on the cystocele; coughing and sneezing with pelvic floor contraction Person educated: Patient Education method: Explanation, Demonstration, Tactile cues, Verbal cues, and Handouts Education comprehension: verbalized understanding, returned demonstration, verbal cues required, tactile cues required, and needs further education     HOME EXERCISE PROGRAM:                 02/02/2022  Access Code: [email or web address] URL: [email or web address] Date: 02/02/2022 Prepared by: Earlie Counts  Program Notes lay down with feet elevated 10 minutes per  day to reduce your prolapse and when you have pressure especially after gardening, mowing. prolapse support underwear  Exercises -  Diaphragmatic Breathing with Hips Elevated (for Pelvic Organ Prolapse)  - 1-2 x daily - 7 x weekly - 1 sets - 1 reps - 10 min hold - Seated Pelvic Floor Contraction  - 3 x daily - 7 x weekly - 1 sets - 10 reps - 5 sec hold - Hooklying Isometric Hip Flexion  - 1 x daily - 3 x weekly - 2 sets - 10 reps - 5 sec hold - Supine Bridging with Alternating Band Pulls with Resistance at Head  - 1 x daily - 3 x weekly - 2 sets - 10 reps - Standing Hip Adduction with Anchored Resistance  - 1 x daily - 3 x weekly - 2 sets - 10 reps - Sidestepping  - 1 x daily - 3 x weekly - 2 sets - 10 reps - Quadruped Pelvic Floor Contraction with Opposite Arm and Leg Lift  - 1 x daily - 3 x weekly - 2 sets - 10 reps    ASSESSMENT:   CLINICAL IMPRESSION: Patient is a 76 y.o. female  who was seen today for physical therapy treatment for cystocele. Patient will leak urine with coughing and sneezing in standing. She is able to walk to the bathroom without leaking due to using the urge to void technique. She now has no pain with vaginal penetration. She is not having abdominal pain. Patient will benefit skilled therapy to improve pelvic floor strength and coordination while being educated on prolapse management.      OBJECTIVE IMPAIRMENTS decreased coordination, decreased endurance, decreased ROM, decreased strength, increased fascial restrictions, and pain.    ACTIVITY LIMITATIONS carrying, lifting, bending, sitting, standing, squatting, and transfers   PARTICIPATION LIMITATIONS: cleaning, laundry, and community activity   PERSONAL FACTORS Age, Fitness, Past/current experiences, and 1-2 comorbidities:    Osteopenia; Abdominal hysterectomy 2014; Gall bladder ; Anterior cervical disectomy fusion 1/6/21surgeryare also affecting patient's functional outcome.    REHAB POTENTIAL: Good   CLINICAL DECISION MAKING: Evolving/moderate complexity   EVALUATION COMPLEXITY: Moderate     GOALS: Goals reviewed with patient? Yes    SHORT TERM GOALS: Target date: 01/18/2022   Patient independent with her intial program for pelvic floor strength and lower abdominal strength Baseline: Goal status: Met 01/05/2022   2.  Lower abdominal pain decreased >/= 50%  since initial eval due to improved strength in the pelvic floor.  Baseline:  Goal status: Met 01/12/2022   3.  Patient was educated on vaginal moisturizers to use to improve the health of the vaginal tissue. Baseline:  Goal status: Met 12/29/2021     LONG TERM GOALS: Target date: 03/15/2022    Patient is independent with her pelvic floor and core exercises.  Baseline:  Goal status: Met 02/02/2022   2.  Patient understands pressure management with daily activities to reduce the strain on the pelvic floor. Baseline:  Goal status: Met 02/02/2022   3.  Patient feels her prolapse improved >/= 50% with using the prolapse management techniques and increasing pelvic floor strength.  Baseline:  Goal status: Met 02/02/2022   4.  Patient reports she is straining 50% less with correct toileting techniques and to reduce the pressure on her cystocele.  Baseline:  Goal status: Met 01/12/2022       PLAN: PT FREQUENCY: 1x/week   PT DURATION: 12 weeks   PLANNED INTERVENTIONS: Therapeutic exercises, Therapeutic activity, Neuromuscular re-education, Patient/Family  education, Self Care, Electrical stimulation, Cryotherapy, Moist heat, Biofeedback, and Manual therapy   PLAN FOR NEXT SESSION:  Discharge to [facility]   Earlie Counts, PT 02/02/22 2:41 PM  PHYSICAL THERAPY DISCHARGE SUMMARY  Visits from Start of Care: 6  Current functional level related to goals / functional outcomes: See above.    Remaining deficits: See above.    Education / Equipment: HEP   Patient agrees to discharge. Patient goals were met. Patient is being discharged due to meeting the stated rehab goals. Thank you for the referral. Earlie Counts, PT 02/02/22 2:41 PM

## 2022-02-09 ENCOUNTER — Encounter: Payer: Medicare Other | Admitting: Physical Therapy

## 2022-02-14 ENCOUNTER — Other Ambulatory Visit: Payer: Self-pay | Admitting: Internal Medicine

## 2022-02-16 ENCOUNTER — Encounter: Payer: Medicare Other | Admitting: Physical Therapy

## 2022-02-21 NOTE — Patient Instructions (Addendum)
Blood work was ordered.   The lab is on the first floor.    Medications changes include :   none     Return in about 6 months (around 08/24/2022) for follow up, Schedule DEXA-[place].    Health Maintenance, Female Adopting a healthy lifestyle and getting preventive care are important in promoting health and wellness. Ask your health care provider about: The right schedule for you to have regular tests and exams. Things you can do on your own to prevent diseases and keep yourself healthy. What should I know about diet, weight, and exercise? Eat a healthy diet  Eat a diet that includes plenty of vegetables, fruits, low-fat dairy products, and lean protein. Do not eat a lot of foods that are high in solid fats, added sugars, or sodium. Maintain a healthy weight Body mass index (BMI) is used to identify weight problems. It estimates body fat based on height and weight. Your health care provider can help determine your BMI and help you achieve or maintain a healthy weight. Get regular exercise Get regular exercise. This is one of the most important things you can do for your health. Most adults should: Exercise for at least 150 minutes each week. The exercise should increase your heart rate and make you sweat (moderate-intensity exercise). Do strengthening exercises at least twice a week. This is in addition to the moderate-intensity exercise. Spend less time sitting. Even light physical activity can be beneficial. Watch cholesterol and blood lipids Have your blood tested for lipids and cholesterol at 76 years of age, then have this test every 5 years. Have your cholesterol levels checked more often if: Your lipid or cholesterol levels are high. You are older than 76 years of age. You are at high risk for heart disease. What should I know about cancer screening? Depending on your health history and family history, you may need to have cancer screening at various ages. This may  include screening for: Breast cancer. Cervical cancer. Colorectal cancer. Skin cancer. Lung cancer. What should I know about heart disease, diabetes, and high blood pressure? Blood pressure and heart disease High blood pressure causes heart disease and increases the risk of stroke. This is more likely to develop in people who have high blood pressure readings or are overweight. Have your blood pressure checked: Every 3-5 years if you are 76-47 years of age. Every year if you are 76 years old or older. Diabetes Have regular diabetes screenings. This checks your fasting blood sugar level. Have the screening done: Once every three years after age 76 if you are at a normal weight and have a low risk for diabetes. More often and at a younger age if you are overweight or have a high risk for diabetes. What should I know about preventing infection? Hepatitis B If you have a higher risk for hepatitis B, you should be screened for this virus. Talk with your health care provider to find out if you are at risk for hepatitis B infection. Hepatitis C Testing is recommended for: Everyone born from [birth year] through 1965. Anyone with known risk factors for hepatitis C. Sexually transmitted infections (STIs) Get screened for STIs, including gonorrhea and chlamydia, if: You are sexually active and are younger than 76 years of age. You are older than 76 years of age and your health care provider tells you that you are at risk for this type of infection. Your sexual activity has changed since you were last screened, and  you are at increased risk for chlamydia or gonorrhea. Ask your health care provider if you are at risk. Ask your health care provider about whether you are at high risk for HIV. Your health care provider may recommend a prescription medicine to help prevent HIV infection. If you choose to take medicine to prevent HIV, you should first get tested for HIV. You should then be tested every 3 months  for as long as you are taking the medicine. Pregnancy If you are about to stop having your period (premenopausal) and you may become pregnant, seek counseling before you get pregnant. Take 400 to 800 micrograms (mcg) of folic acid every day if you become pregnant. Ask for birth control (contraception) if you want to prevent pregnancy. Osteoporosis and menopause Osteoporosis is a disease in which the bones lose minerals and strength with aging. This can result in bone fractures. If you are 76 years old or older, or if you are at risk for osteoporosis and fractures, ask your health care provider if you should: Be screened for bone loss. Take a calcium or vitamin D supplement to lower your risk of fractures. Be given hormone replacement therapy (HRT) to treat symptoms of menopause. Follow these instructions at home: Alcohol use Do not drink alcohol if: Your health care provider tells you not to drink. You are pregnant, may be pregnant, or are planning to become pregnant. If you drink alcohol: Limit how much you have to: 0-1 drink a day. Know how much alcohol is in your drink. In the [place] one drink equals one 12 oz bottle of beer (355 mL), one 5 oz glass of wine (148 mL), or one 1 oz glass of hard liquor (44 mL). Lifestyle Do not use any products that contain nicotine or tobacco. These products include cigarettes, chewing tobacco, and vaping devices, such as e-cigarettes. If you need help quitting, ask your health care provider. Do not use street drugs. Do not share needles. Ask your health care provider for help if you need support or information about quitting drugs. General instructions Schedule regular health, dental, and eye exams. Stay current with your vaccines. Tell your health care provider if: You often feel depressed. You have ever been abused or do not feel safe at home. Summary Adopting a healthy lifestyle and getting preventive care are important in promoting health and  wellness. Follow your health care provider's instructions about healthy diet, exercising, and getting tested or screened for diseases. Follow your health care provider's instructions on monitoring your cholesterol and blood pressure. This information is not intended to replace advice given to you by your health care provider. Make sure you discuss any questions you have with your health care provider. Document Revised: 09/13/2020 Document Reviewed: 09/13/2020 Elsevier Patient Education  [place].

## 2022-02-21 NOTE — Progress Notes (Unsigned)
Subjective:    Patient ID: Morgan Hancock, female    DOB: 1945-08-13, 76 y.o.   MRN: [id number]      HPI Deniya is here for a Physical exam.   No concerns.    Medications and allergies reviewed with patient and updated if appropriate.  Current Outpatient Medications on File Prior to Visit  Medication Sig Dispense Refill   alendronate (FOSAMAX) 70 MG tablet TAKE 1 TABLET BY MOUTH EVERY 7 (SEVEN) DAYS. TAKE WITH A FULL GLASS OF WATER ON AN EMPTY STOMACH. 12 tablet 3   aspirin 81 MG chewable tablet Chew 81 mg by mouth at bedtime.     atenolol (TENORMIN) 50 MG tablet TAKE 1 TABLET BY MOUTH EVERY DAY 90 tablet 3   BIOTIN PO Take 5,000 mcg by mouth daily.     CALCIUM-VITAMIN D PO Take 1 tablet by mouth 2 (two) times daily. '1200mg'$  / 1000 units     fenofibrate (TRICOR) 145 MG tablet Take 1 tablet (145 mg total) by mouth daily. 90 tablet 3   hydrocortisone (ANUSOL-HC) 2.5 % rectal cream Apply 1 application topically 2 (two) times daily.     ketoconazole (NIZORAL) 2 % cream Apply 1 application topically 2 (two) times daily.     Omega-3 Fatty Acids (FISH OIL) 1000 MG CAPS Take 1 capsule by mouth daily.     rosuvastatin (CRESTOR) 5 MG tablet TAKE 1 TABLET (5 MG TOTAL) BY MOUTH 2 (TWO) TIMES A WEEK. 27 tablet 3   No current facility-administered medications on file prior to visit.    Review of Systems  Constitutional:  Negative for fever.  Eyes:  Negative for visual disturbance.  Respiratory:  Negative for cough, shortness of breath and wheezing.   Cardiovascular:  Negative for chest pain, palpitations and leg swelling.  Gastrointestinal:  Negative for abdominal pain, blood in stool, constipation, diarrhea and nausea.       No gerd  Genitourinary:  Negative for dysuria.  Musculoskeletal:  Negative for arthralgias and back pain.  Skin:  Negative for rash.  Neurological:  Positive for light-headedness (with quick movements, maybe head movements). Negative for headaches.   Psychiatric/Behavioral:  Negative for dysphoric mood. The patient is not nervous/anxious.        Objective:   Vitals:   02/22/22 0940  BP: 138/82  Pulse: 60  Temp: 98 F (36.7 C)  SpO2: 97%   Filed Weights   02/22/22 0940  Weight: 141 lb (64 kg)   Body mass index is 25.79 kg/m.  BP Readings from Last 3 Encounters:  02/22/22 138/82  08/30/21 122/64  06/24/21 140/76    Wt Readings from Last 3 Encounters:  02/22/22 141 lb (64 kg)  08/30/21 138 lb (62.6 kg)  08/09/21 138 lb (62.6 kg)       Physical Exam Constitutional: She appears well-developed and well-nourished. No distress.  HENT:  Head: Normocephalic and atraumatic.  Right Ear: External ear normal. Normal ear canal and TM Left Ear: External ear normal.  Normal ear canal and TM Mouth/Throat: Oropharynx is clear and moist.  Eyes: Conjunctivae normal.  Neck: Neck supple. No tracheal deviation present. No thyromegaly present.  No carotid bruit  Cardiovascular: Normal rate, regular rhythm and normal heart sounds.   No murmur heard.  No edema. Pulmonary/Chest: Effort normal and breath sounds normal. No respiratory distress. She has no wheezes. She has no rales.  Breast: deferred   Abdominal: Soft. She exhibits no distension. There is no tenderness.  Lymphadenopathy: She  has no cervical adenopathy.  Skin: Skin is warm and dry. She is not diaphoretic.  Psychiatric: She has a normal mood and affect. Her behavior is normal.     Lab Results  Component Value Date   WBC 5.5 02/21/2021   HGB 13.4 02/21/2021   HCT 40.5 02/21/2021   PLT 221.0 02/21/2021   GLUCOSE 99 02/21/2021   CHOL 168 02/21/2021   TRIG 64.0 02/21/2021   HDL 52.00 02/21/2021   LDLCALC 103 (H) 02/21/2021   ALT 25 02/21/2021   AST 36 02/21/2021   NA 142 02/21/2021   K 4.3 02/21/2021   CL 106 02/21/2021   CREATININE 1.01 02/21/2021   BUN 20 02/21/2021   CO2 28 02/21/2021   TSH 2.46 02/21/2021   HGBA1C 6.4 02/21/2021          Assessment & Plan:   Physical exam: Screening blood work  ordered Exercise  walking daily Weight  good Substance abuse  none   Reviewed recommended immunizations.   Health Maintenance  Topic Date Due   COVID-19 Vaccine (4 - Moderna series) 03/10/2022 (Originally 06/30/2020)   INFLUENZA VACCINE  08/06/2022 (Originally 12/06/2021)   TETANUS/TDAP  02/23/2023 (Originally 05/30/2021)   DEXA SCAN  03/02/2022   Pneumonia Vaccine 4+ Years old  Completed   Hepatitis C Screening  Completed   HPV VACCINES  Aged Out   COLONOSCOPY (Pts 45-67yr Insurance coverage will need to be confirmed)  Discontinued   Zoster Vaccines- Shingrix  Discontinued          See Problem List for Assessment and Plan of chronic medical problems.

## 2022-02-22 ENCOUNTER — Ambulatory Visit (INDEPENDENT_AMBULATORY_CARE_PROVIDER_SITE_OTHER): Payer: Medicare Other | Admitting: Internal Medicine

## 2022-02-22 ENCOUNTER — Encounter: Payer: Self-pay | Admitting: Internal Medicine

## 2022-02-22 ENCOUNTER — Inpatient Hospital Stay: Admission: RE | Admit: 2022-02-22 | Payer: Medicare Other | Source: Ambulatory Visit

## 2022-02-22 VITALS — BP 138/82 | HR 60 | Temp 98.0°F | Ht 62.0 in | Wt 141.0 lb

## 2022-02-22 DIAGNOSIS — N8110 Cystocele, unspecified: Secondary | ICD-10-CM

## 2022-02-22 DIAGNOSIS — M85851 Other specified disorders of bone density and structure, right thigh: Secondary | ICD-10-CM | POA: Diagnosis not present

## 2022-02-22 DIAGNOSIS — E559 Vitamin D deficiency, unspecified: Secondary | ICD-10-CM | POA: Diagnosis not present

## 2022-02-22 DIAGNOSIS — R7303 Prediabetes: Secondary | ICD-10-CM | POA: Diagnosis not present

## 2022-02-22 DIAGNOSIS — Z0000 Encounter for general adult medical examination without abnormal findings: Secondary | ICD-10-CM

## 2022-02-22 DIAGNOSIS — I700 Atherosclerosis of aorta: Secondary | ICD-10-CM

## 2022-02-22 DIAGNOSIS — I10 Essential (primary) hypertension: Secondary | ICD-10-CM

## 2022-02-22 DIAGNOSIS — E782 Mixed hyperlipidemia: Secondary | ICD-10-CM

## 2022-02-22 DIAGNOSIS — M85852 Other specified disorders of bone density and structure, left thigh: Secondary | ICD-10-CM

## 2022-02-22 DIAGNOSIS — R931 Abnormal findings on diagnostic imaging of heart and coronary circulation: Secondary | ICD-10-CM | POA: Diagnosis not present

## 2022-02-22 HISTORY — DX: Cystocele, unspecified: N81.10

## 2022-02-22 LAB — COMPREHENSIVE METABOLIC PANEL
ALT: 26 U/L (ref 0–35)
AST: 37 U/L (ref 0–37)
Albumin: 4.1 g/dL (ref 3.5–5.2)
Alkaline Phosphatase: 35 U/L — ABNORMAL LOW (ref 39–117)
BUN: 20 mg/dL (ref 6–23)
CO2: 28 mEq/L (ref 19–32)
Calcium: 9.5 mg/dL (ref 8.4–10.5)
Chloride: 106 mEq/L (ref 96–112)
Creatinine, Ser: 1.03 mg/dL (ref 0.40–1.20)
GFR: 52.91 mL/min — ABNORMAL LOW (ref >60.00)
Glucose, Bld: 93 mg/dL (ref 70–99)
Potassium: 4 mEq/L (ref 3.5–5.1)
Sodium: 141 mEq/L (ref 135–145)
Total Bilirubin: 0.5 mg/dL (ref 0.2–1.2)
Total Protein: 7.3 g/dL (ref 6.0–8.3)

## 2022-02-22 LAB — CBC WITH DIFFERENTIAL/PLATELET
Basophils Absolute: 0.1 10*3/uL (ref 0.0–0.1)
Basophils Relative: 0.9 % (ref 0.0–3.0)
Eosinophils Absolute: 0.3 10*3/uL (ref 0.0–0.7)
Eosinophils Relative: 5.3 % — ABNORMAL HIGH (ref 0.0–5.0)
HCT: 39.9 % (ref 36.0–46.0)
Hemoglobin: 13.2 g/dL (ref 12.0–15.0)
Lymphocytes Relative: 28.5 % (ref 12.0–46.0)
Lymphs Abs: 1.7 10*3/uL (ref 0.7–4.0)
MCHC: 33.1 g/dL (ref 30.0–36.0)
MCV: 90.5 fl (ref 78.0–100.0)
Monocytes Absolute: 0.4 10*3/uL (ref 0.1–1.0)
Monocytes Relative: 7.2 % (ref 3.0–12.0)
Neutro Abs: 3.5 10*3/uL (ref 1.4–7.7)
Neutrophils Relative %: 58.1 % (ref 43.0–77.0)
Platelets: 233 10*3/uL (ref 150.0–400.0)
RBC: 4.41 Mil/uL (ref 3.87–5.11)
RDW: 14.6 % (ref 11.5–15.5)
WBC: 6 10*3/uL (ref 4.0–10.5)

## 2022-02-22 LAB — LIPID PANEL
Cholesterol: 154 mg/dL (ref 0–200)
HDL: 42.8 mg/dL (ref >39.00)
LDL Cholesterol: 98 mg/dL (ref 0–99)
NonHDL: 111.11
Total CHOL/HDL Ratio: 4
Triglycerides: 64 mg/dL (ref 0.0–149.0)
VLDL: 12.8 mg/dL (ref 0.0–40.0)

## 2022-02-22 LAB — HEMOGLOBIN A1C: Hgb A1c MFr Bld: 6.7 % — ABNORMAL HIGH (ref 4.6–6.5)

## 2022-02-22 LAB — VITAMIN D 25 HYDROXY (VIT D DEFICIENCY, FRACTURES): VITD: 33.48 ng/mL (ref 30.00–100.00)

## 2022-02-22 LAB — TSH: TSH: 1.96 u[IU]/mL (ref 0.35–5.50)

## 2022-02-22 NOTE — Assessment & Plan Note (Signed)
Chronic DEXA due-ordered Fosamax started 03/2020-plan to continue for 5 years Continue calcium and vitamin D supplementation Continue regular exercise Check vitamin D level, CMP, CBC

## 2022-02-22 NOTE — Assessment & Plan Note (Signed)
Chronic Check a1c Low sugar / carb diet Stressed regular exercise  

## 2022-02-22 NOTE — Assessment & Plan Note (Signed)
Chronic Doing pelvic PT Following with gynecology

## 2022-02-22 NOTE — Assessment & Plan Note (Addendum)
Chronic Continue rosuvastatin 5 mg twice a week, fenofibrate 145 mg daily Encouraged healthy diet, regular exercise Higher risk for CAD - would like Ct coronary calcium score

## 2022-02-22 NOTE — Assessment & Plan Note (Signed)
Chronic Taking vitamin D daily Check vitamin D level  

## 2022-02-22 NOTE — Assessment & Plan Note (Signed)
Chronic Blood pressure well controlled CMP Continue atenolol 50 mg daily 

## 2022-02-22 NOTE — Assessment & Plan Note (Signed)
Chronic Regular exercise and healthy diet encouraged Check lipid panel  Continue Crestor 5 mg twice weekly, fenofibrate 145 mg daily

## 2022-02-23 ENCOUNTER — Encounter: Payer: Medicare Other | Admitting: Physical Therapy

## 2022-03-02 ENCOUNTER — Encounter: Payer: Medicare Other | Admitting: Physical Therapy

## 2022-03-03 ENCOUNTER — Ambulatory Visit (INDEPENDENT_AMBULATORY_CARE_PROVIDER_SITE_OTHER)
Admission: RE | Admit: 2022-03-03 | Discharge: 2022-03-03 | Disposition: A | Discharge status: Home / Self Care | Payer: Medicare Other | Source: Ambulatory Visit | Attending: Internal Medicine | Admitting: Internal Medicine

## 2022-03-03 DIAGNOSIS — M85851 Other specified disorders of bone density and structure, right thigh: Secondary | ICD-10-CM

## 2022-03-03 DIAGNOSIS — M85852 Other specified disorders of bone density and structure, left thigh: Secondary | ICD-10-CM | POA: Diagnosis not present

## 2022-03-06 ENCOUNTER — Ambulatory Visit (HOSPITAL_COMMUNITY)
Admission: RE | Admit: 2022-03-06 | Discharge: 2022-03-06 | Disposition: A | Discharge status: Home / Self Care | Payer: Medicare Other | Source: Ambulatory Visit | Attending: Internal Medicine | Admitting: Internal Medicine

## 2022-03-06 DIAGNOSIS — I10 Essential (primary) hypertension: Secondary | ICD-10-CM | POA: Insufficient documentation

## 2022-03-06 DIAGNOSIS — I700 Atherosclerosis of aorta: Secondary | ICD-10-CM | POA: Insufficient documentation

## 2022-03-06 DIAGNOSIS — E782 Mixed hyperlipidemia: Secondary | ICD-10-CM | POA: Insufficient documentation

## 2022-03-08 ENCOUNTER — Encounter: Payer: Self-pay | Admitting: Internal Medicine

## 2022-03-08 DIAGNOSIS — R931 Abnormal findings on diagnostic imaging of heart and coronary circulation: Secondary | ICD-10-CM

## 2022-03-08 HISTORY — DX: Abnormal findings on diagnostic imaging of heart and coronary circulation: R93.1

## 2022-03-08 NOTE — Addendum Note (Signed)
Addended by: Binnie Rail on: 03/08/2022 05:34 AM   Modules accepted: Orders

## 2022-03-16 ENCOUNTER — Encounter: Payer: Self-pay | Admitting: Cardiology

## 2022-03-16 ENCOUNTER — Ambulatory Visit: Payer: Medicare Other | Attending: Cardiology | Admitting: Cardiology

## 2022-03-16 VITALS — BP 170/80 | HR 64 | Ht 62.0 in | Wt 137.4 lb

## 2022-03-16 DIAGNOSIS — R931 Abnormal findings on diagnostic imaging of heart and coronary circulation: Secondary | ICD-10-CM | POA: Diagnosis not present

## 2022-03-16 DIAGNOSIS — R7303 Prediabetes: Secondary | ICD-10-CM | POA: Diagnosis not present

## 2022-03-16 DIAGNOSIS — I10 Essential (primary) hypertension: Secondary | ICD-10-CM

## 2022-03-16 DIAGNOSIS — I700 Atherosclerosis of aorta: Secondary | ICD-10-CM | POA: Diagnosis not present

## 2022-03-16 DIAGNOSIS — I6523 Occlusion and stenosis of bilateral carotid arteries: Secondary | ICD-10-CM | POA: Diagnosis not present

## 2022-03-16 DIAGNOSIS — I2510 Atherosclerotic heart disease of native coronary artery without angina pectoris: Secondary | ICD-10-CM

## 2022-03-16 MED ORDER — ROSUVASTATIN CALCIUM 20 MG PO TABS: 20.0000 mg | ORAL_TABLET | Freq: Every day | ORAL | 3 refills | Status: DC

## 2022-03-16 NOTE — Patient Instructions (Signed)
Medication Instructions:  Your physician has recommended you make the following change in your medication:  Increase Crestor to 20 mg at bedtime  *If you need a refill on your cardiac medications before your next appointment, please call your pharmacy*   Lab Work: NONE If you have labs (blood work) drawn today and your tests are completely normal, you will receive your results only by: [place] (if you have MyChart) OR A paper copy in the mail If you have any lab test that is abnormal or we need to change your treatment, we will call you to review the results.   Testing/Procedures: Your physician has requested that you have a lexiscan myoview. For further information please visit [email or web address]. Please follow instruction sheet, as given.   The test will take approximately 3 to 4 hours to complete; you may bring reading material.  If someone comes with you to your appointment, they will need to remain in the main lobby due to limited space in the testing area.How to prepare for your Myocardial Perfusion Test: Do not eat or drink 3 hours prior to your test, except you may have water. Do not consume products containing caffeine (regular or decaffeinated) 12 hours prior to your test. (ex: coffee, chocolate, sodas, tea). Do bring a list of your current medications with you.  If not listed below, you may take your medications as normal. Do wear comfortable clothes (no dresses or overalls) and walking shoes, tennis shoes preferred (No heels or open toe shoes are allowed). Do NOT wear cologne, perfume, aftershave, or lotions (deodorant is allowed). If these instructions are not followed, your test will have to be rescheduled.    Follow-Up: At [facility], you and your health needs are our priority.  As part of our continuing mission to provide you with exceptional heart care, we have created designated Provider Care Teams.  These Care Teams include your primary  Cardiologist (physician) and Advanced Practice Providers (APPs -  Physician Assistants and Nurse Practitioners) who all work together to provide you with the care you need, when you need it.  We recommend signing up for the patient portal called "MyChart".  Sign up information is provided on this After Visit Summary.  MyChart is used to connect with patients for Virtual Visits (Telemedicine).  Patients are able to view lab/test results, encounter notes, upcoming appointments, etc.  Non-urgent messages can be sent to your provider as well.   To learn more about what you can do with MyChart, go to [email or web address].    Your next appointment:   3 month(s)  The format for your next appointment:   In Person  Provider:   Jenne Campus, MD    Other Instructions   Important Information About Sugar

## 2022-03-16 NOTE — Progress Notes (Signed)
Cardiology Consultation:    Date:  03/16/2022   ID:  Morgan Hancock, DOB 29-Oct-1945, MRN [id number]  PCP:  Binnie Rail, MD  Cardiologist:  Jenne Campus, MD   Referring MD: Binnie Rail, MD   Chief Complaint  Patient presents with   Results    Abnormal CT per patient     History of Present Illness:    Morgan Hancock is a 76 y.o. female who is being seen today for the evaluation of abnormal calcium score at the request of Binnie Rail, MD. past medical history include essential hypertension, dyslipidemia, atherosclerosis of the aortic, carotic arterial disease with up to 20% stenosis diagnosed in February 2020, essential hypertension, dyslipidemia, family history of premature coronary artery disease.  She went to her primary care physician and ask for some test that will tell her if she got any significant coronary artery disease, she eventually end up having calcium score done which showed coronary artery disease with calcium score of 1488 which is 96 percentile for her age sex and much controlled.  Monitor of calcium has been localized in left anterior descending artery, portion of it in the right coronary artery left main very little. Interestingly she is telling me she is completely asymptomatic she can walk climb stairs have no difficulty she is taking care of activities of daily living with no difficulties she is can walk and work in the garden with no problem. She never smoked Multiple family members coronary artery disease some premature.  Past Medical History:  Diagnosis Date   Cataract    Colon polyps 06/18/2013   Episodic lightheadedness 02/21/2021   Fatty liver    Fibrocystic breast disease 11/20/2015   Has breast tenderness on right   Hair thinning 12/18/2017   Herpes zoster without complication 09/32/3557   History of fusion of cervical spine 06/04/2019   Hx of adenomatous colonic polyps 06/18/2013   Overview:  Had colonoscopy 06/18/2013 added new dx of  colon polyps and Diverticuiosis    Hyperlipidemia    Hypertension    Osteopenia after menopause 2019   Prediabetes 12/20/2017    Past Surgical History:  Procedure Laterality Date   ABDOMINAL HYSTERECTOMY     prolapse   ANTERIOR CERVICAL DECOMP/DISCECTOMY FUSION N/A 05/14/2019   Procedure: ANTERIOR CERVICAL DECOMPRESSION/DISCECTOMY FUSION CERVICAL FIVE- CERVICAL SIX, CERVICAL SIX- CERVICAL SEVEN, CERVICAL SEVEN- THORACIC ONE;  Surgeon: Jovita Gamma, MD;  Location: [facility];  Service: Neurosurgery;  Laterality: N/A;  ANTERIOR CERVICAL DECOMPRESSION/DISCECTOMY FUSION CERVICAL 5- CERVICAL 6, CERVICAL 6- CERVICAL 7, CERVICAL 7- THORACIC 1   CHOLECYSTECTOMY     COLONOSCOPY  06/18/2013   COLONOSCOPY WITH PROPOFOL  07/18/2018   Dr.Gessner   GALLBLADDER SURGERY  07/06/2012   TONSILLECTOMY  03/12/1952   TUBAL LIGATION      Current Medications: Current Meds  Medication Sig   alendronate (FOSAMAX) 70 MG tablet TAKE 1 TABLET BY MOUTH EVERY 7 (SEVEN) DAYS. TAKE WITH A FULL GLASS OF WATER ON AN EMPTY STOMACH. (Patient taking differently: Take 70 mg by mouth once a week.)   aspirin 81 MG chewable tablet Chew 81 mg by mouth at bedtime.   atenolol (TENORMIN) 50 MG tablet TAKE 1 TABLET BY MOUTH EVERY DAY (Patient taking differently: Take 50 mg by mouth daily.)   BIOTIN PO Take 5,000 mcg by mouth daily.   CALCIUM-VITAMIN D PO Take 1 tablet by mouth 2 (two) times daily. '1200mg'$  / 1000 units   fenofibrate (TRICOR) 145 MG tablet Take  1 tablet (145 mg total) by mouth daily.   hydrocortisone (ANUSOL-HC) 2.5 % rectal cream Apply 1 application topically 2 (two) times daily.   Omega-3 Fatty Acids (FISH OIL) 1000 MG CAPS Take 1 capsule by mouth daily.   rosuvastatin (CRESTOR) 20 MG tablet Take 1 tablet (20 mg total) by mouth daily.   [DISCONTINUED] ketoconazole (NIZORAL) 2 % cream Apply 1 application topically 2 (two) times daily.   [DISCONTINUED] rosuvastatin (CRESTOR) 5 MG tablet TAKE 1 TABLET (5 MG TOTAL)  BY MOUTH 2 (TWO) TIMES A WEEK.     Allergies:   Lipitor [atorvastatin]   Social History   Socioeconomic History   Marital status: Married    Spouse name: Not on file   Number of children: Not on file   Years of education: Not on file   Highest education level: Not on file  Occupational History   Not on file  Tobacco Use   Smoking status: Never   Smokeless tobacco: Never  Vaping Use   Vaping Use: Never used  Substance and Sexual Activity   Alcohol use: No   Drug use: No   Sexual activity: Not Currently  Other Topics Concern   Not on file  Social History Narrative   Married   Exercise: gardening, mows lawn, [place] [facility] [employer] [facility]   No alcohol tobacco or drug use   Social Determinants of Health   Financial Resource Strain: Low Risk  (11/15/2020)   Overall Financial Resource Strain (CARDIA)    Difficulty of Paying Living Expenses: Not hard at all  Food Insecurity: No Food Insecurity (11/15/2020)   Hunger Vital Sign    Worried About Running Out of Food in the Last Year: Never true    [place] in the Last Year: Never true  Transportation Needs: No Transportation Needs (11/15/2020)   PRAPARE - Hydrologist (Medical): No    Lack of Transportation (Non-Medical): No  Physical Activity: Sufficiently Active (11/15/2020)   Exercise Vital Sign    Days of Exercise per Week: 5 days    Minutes of Exercise per Session: 30 min  Stress: No Stress Concern Present (11/15/2020)   [place]    Feeling of Stress : Not at all  Social Connections: [place] (11/15/2020)   Social Connection and Isolation Panel [NHANES]    Frequency of Communication with Friends and Family: Three times a week    Frequency of Social Gatherings with Friends and Family: Once a week    Attends Religious Services: More than 4 times per year    Active Member of [employer] or Organizations: Yes    Attends Arts development officer: More than 4 times per year    Marital Status: Married     Family History: The patient's family history includes Diabetes in her father; Heart disease in her father; Hyperlipidemia in her brother and mother; Hypertension in her brother; Miscarriages / [place] in her maternal grandmother and paternal grandfather; Parkinson's disease in her father. There is no history of Colon cancer, Stomach cancer, Colon polyps, Esophageal cancer, or Rectal cancer. ROS:   Please see the history of present illness.    All 14 point review of systems negative except as described per history of present illness.  EKGs/Labs/Other Studies Reviewed:    The following studies were reviewed today: Coronary calcium score of 1418 which is 96 percentile.  EKG:  EKG is  ordered today.  The ekg ordered today demonstrates normal sinus rhythm, normal P interval, left axis deviation  Recent Labs: 02/22/2022: ALT 26; BUN 20; Creatinine, Ser 1.03; Hemoglobin 13.2; Platelets 233.0; Potassium 4.0; Sodium 141; TSH 1.96  Recent Lipid Panel    Component Value Date/Time   CHOL 154 02/22/2022 1039   TRIG 64.0 02/22/2022 1039   HDL 42.80 02/22/2022 1039   CHOLHDL 4 02/22/2022 1039   VLDL 12.8 02/22/2022 1039   LDLCALC 98 02/22/2022 1039    Physical Exam:    VS:  BP (!) 170/80 (BP Location: Left Arm, Patient Position: Sitting)   Pulse 64   Ht '5\' 2"'$  (1.575 m)   Wt 137 lb 6.4 oz (62.3 kg)   SpO2 95%   BMI 25.13 kg/m     Wt Readings from Last 3 Encounters:  03/16/22 137 lb 6.4 oz (62.3 kg)  02/22/22 141 lb (64 kg)  08/30/21 138 lb (62.6 kg)     GEN:  Well nourished, well developed in no acute distress HEENT: Normal NECK: No JVD; No carotid bruits LYMPHATICS: No lymphadenopathy CARDIAC: RRR, no murmurs, no rubs, no gallops RESPIRATORY:  Clear to auscultation without rales, wheezing or rhonchi  ABDOMEN: Soft, non-tender, non-distended MUSCULOSKELETAL:  No edema; No deformity  SKIN:  Warm and dry NEUROLOGIC:  Alert and oriented x 3 PSYCHIATRIC:  Normal affect   ASSESSMENT:    1. Primary hypertension   2. Bilateral carotid artery stenosis   3. Atherosclerosis of aorta ([place])   4. Coronary artery disease without angina pectoris, unspecified vessel or lesion type, unspecified whether native or transplanted heart   5. [place] coronary artery calcium score of 1418,   02/2022   6. Prediabetes    PLAN:    In order of problems listed above:  Coronary artery disease as proven by calcification of the coronary artery.  She does not have any symptomatology suggesting obstructive disease, however, we need to get objective evidence of breath therefore, I will schedule her to have a stress test.  In the meantime she is on antiplatelet therapy.  Again she has no symptoms there is no role for antianginal medication.  She is already on beta-blocker which I will continue. Dyslipidemia I did review her K PN which show me LDL of 98 HDL 42.  I will increase dose of her Crestor she will be taking 20 mg.  Fasting lipid profile, AST ALT he will be checked within the next 6 weeks. Carotic arterial disease schedule him to have carotic ultrasounds.  Last test done in 2020.   Medication Adjustments/Labs and Tests Ordered: Current medicines are reviewed at length with the patient today.  Concerns regarding medicines are outlined above.  Orders Placed This Encounter  Procedures   MYOCARDIAL PERFUSION IMAGING   EKG 12-Lead   VAS US CAROTID   Meds ordered this encounter  Medications   rosuvastatin (CRESTOR) 20 MG tablet    Sig: Take 1 tablet (20 mg total) by mouth daily.    Dispense:  90 tablet    Refill:  3    Signed, Park Liter, MD, [facility]. 03/16/2022 12:00 PM    [place]

## 2022-04-06 ENCOUNTER — Telehealth (HOSPITAL_COMMUNITY): Payer: Self-pay | Admitting: *Deleted

## 2022-04-06 NOTE — Telephone Encounter (Signed)
Left message on voicemail per DPR in reference to upcoming appointment scheduled on 04/12/2022 at 8:00 with detailed instructions given per Myocardial Perfusion Study Information Sheet for the test. LM to arrive 15 minutes early, and that it is imperative to arrive on time for appointment to keep from having the test rescheduled. If you need to cancel or reschedule your appointment, please call the office within 24 hours of your appointment. Failure to do so may result in a cancellation of your appointment, and a $50 no show fee. Phone number given for call back for any questions.

## 2022-04-12 ENCOUNTER — Ambulatory Visit: Payer: Medicare Other | Attending: Cardiology

## 2022-04-12 ENCOUNTER — Other Ambulatory Visit: Payer: Self-pay | Admitting: Internal Medicine

## 2022-04-12 ENCOUNTER — Ambulatory Visit (INDEPENDENT_AMBULATORY_CARE_PROVIDER_SITE_OTHER): Payer: Medicare Other

## 2022-04-12 DIAGNOSIS — I700 Atherosclerosis of aorta: Secondary | ICD-10-CM | POA: Diagnosis not present

## 2022-04-12 DIAGNOSIS — R0609 Other forms of dyspnea: Secondary | ICD-10-CM | POA: Diagnosis not present

## 2022-04-12 DIAGNOSIS — I2510 Atherosclerotic heart disease of native coronary artery without angina pectoris: Secondary | ICD-10-CM

## 2022-04-12 DIAGNOSIS — I10 Essential (primary) hypertension: Secondary | ICD-10-CM

## 2022-04-12 DIAGNOSIS — I6523 Occlusion and stenosis of bilateral carotid arteries: Secondary | ICD-10-CM

## 2022-04-12 LAB — MYOCARDIAL PERFUSION IMAGING
LV dias vol: 57 mL (ref 46–106)
LV sys vol: 19 mL
Nuc Stress EF: 66 %
Peak HR: 93 {beats}/min
Rest HR: 60 {beats}/min
Rest Nuclear Isotope Dose: 9.8 mCi
SDS: 2
SRS: 0
SSS: 2
Stress Nuclear Isotope Dose: 30.8 mCi
TID: 1.05

## 2022-04-12 MED ORDER — TECHNETIUM TC 99M TETROFOSMIN IV KIT
30.8000 | PACK | Freq: Once | INTRAVENOUS | Status: AC | PRN
  Administered 2022-04-12: 30.8 via INTRAVENOUS

## 2022-04-12 MED ORDER — REGADENOSON 0.4 MG/5ML IV SOLN
0.4000 mg | Freq: Once | INTRAVENOUS | Status: AC
  Administered 2022-04-12: 0.4 mg via INTRAVENOUS

## 2022-04-12 MED ORDER — TECHNETIUM TC 99M TETROFOSMIN IV KIT
9.8000 | PACK | Freq: Once | INTRAVENOUS | Status: AC | PRN
  Administered 2022-04-12: 9.8 via INTRAVENOUS

## 2022-04-18 ENCOUNTER — Telehealth: Payer: Self-pay

## 2022-04-18 NOTE — Telephone Encounter (Signed)
Patient notified of results through my chart.

## 2022-04-18 NOTE — Telephone Encounter (Signed)
-----   Message from Park Liter, MD sent at 04/18/2022  8:37 AM EST ----- Carotic ultrasound show up to 49% size bilaterally, medical therapy

## 2022-04-18 NOTE — Telephone Encounter (Signed)
-----   Message from Park Liter, MD sent at 04/18/2022  8:39 AM EST ----- Stress test is negative

## 2022-04-18 NOTE — Telephone Encounter (Signed)
Patient notified of results via mychart

## 2022-05-17 DIAGNOSIS — Z1231 Encounter for screening mammogram for malignant neoplasm of breast: Secondary | ICD-10-CM | POA: Diagnosis not present

## 2022-05-18 LAB — HM MAMMOGRAPHY

## 2022-06-01 ENCOUNTER — Encounter: Payer: Self-pay | Admitting: Internal Medicine

## 2022-06-01 NOTE — Progress Notes (Signed)
Outside notes received. Information abstracted. Notes sent to scan. 

## 2022-06-15 DIAGNOSIS — L570 Actinic keratosis: Secondary | ICD-10-CM | POA: Diagnosis not present

## 2022-06-15 DIAGNOSIS — L814 Other melanin hyperpigmentation: Secondary | ICD-10-CM | POA: Diagnosis not present

## 2022-06-15 DIAGNOSIS — D1801 Hemangioma of skin and subcutaneous tissue: Secondary | ICD-10-CM | POA: Diagnosis not present

## 2022-06-15 DIAGNOSIS — L218 Other seborrheic dermatitis: Secondary | ICD-10-CM | POA: Diagnosis not present

## 2022-06-20 ENCOUNTER — Ambulatory Visit: Payer: Medicare Other | Attending: Cardiology | Admitting: Cardiology

## 2022-06-20 ENCOUNTER — Encounter: Payer: Self-pay | Admitting: Cardiology

## 2022-06-20 VITALS — BP 130/70 | HR 70 | Ht 62.0 in | Wt 138.0 lb

## 2022-06-20 DIAGNOSIS — R931 Abnormal findings on diagnostic imaging of heart and coronary circulation: Secondary | ICD-10-CM

## 2022-06-20 DIAGNOSIS — E782 Mixed hyperlipidemia: Secondary | ICD-10-CM | POA: Diagnosis not present

## 2022-06-20 DIAGNOSIS — I10 Essential (primary) hypertension: Secondary | ICD-10-CM | POA: Diagnosis not present

## 2022-06-20 DIAGNOSIS — I700 Atherosclerosis of aorta: Secondary | ICD-10-CM

## 2022-06-20 DIAGNOSIS — I6523 Occlusion and stenosis of bilateral carotid arteries: Secondary | ICD-10-CM | POA: Diagnosis not present

## 2022-06-20 NOTE — Progress Notes (Unsigned)
This visit was accompanied by Truddie Hidden, RN.

## 2022-06-20 NOTE — Patient Instructions (Addendum)
Medication Instructions:  Your physician recommends that you continue on your current medications as directed. Please refer to the Current Medication list given to you today.  *If you need a refill on your cardiac medications before your next appointment, please call your pharmacy*   Lab Work: Your physician recommends that you have your LpA, lipids, AST and ALT done.  If you have labs (blood work) drawn today and your tests are completely normal, you will receive your results only by: [place] (if you have MyChart) OR A paper copy in the mail If you have any lab test that is abnormal or we need to change your treatment, we will call you to review the results.   Testing/Procedures: None ordered   Follow-Up: At [facility], you and your health needs are our priority.  As part of our continuing mission to provide you with exceptional heart care, we have created designated Provider Care Teams.  These Care Teams include your primary Cardiologist (physician) and Advanced Practice Providers (APPs -  Physician Assistants and Nurse Practitioners) who all work together to provide you with the care you need, when you need it.  We recommend signing up for the patient portal called "MyChart".  Sign up information is provided on this After Visit Summary.  MyChart is used to connect with patients for Virtual Visits (Telemedicine).  Patients are able to view lab/test results, encounter notes, upcoming appointments, etc.  Non-urgent messages can be sent to your provider as well.   To learn more about what you can do with MyChart, go to [email or web address].    Your next appointment:   6 month(s)  The format for your next appointment:   In Person  Provider:   Venia Carbon, NP [facility])    Other Instructions This visit was accompanied by Truddie Hidden, RN.   Important Information About Sugar

## 2022-06-20 NOTE — Progress Notes (Unsigned)
Cardiology Office Note:   Date:  06/20/2022   ID:  Morgan Hancock, DOB December 04, 1945, MRN [id number]  PCP:  Binnie Rail, MD  Cardiologist:  Jenne Campus, MD    Referring MD: Binnie Rail, MD   No chief complaint on file.   History of Present Illness:    Morgan Hancock is a 77 y.o. female with past medical history significant for abnormal calcium score.  It was done as a routine screening because of family history of premature coronary artery disease.  After that she had a stress test done which showed no evidence of ischemia, she also got carotid arterial disease which is nonobstructive based on ultrasounds of her neck done in April 12, 2022, dyslipidemia, essential hypertension. She comes today to months for follow-up.  Overall doing very well.  She denies having chest pain tightness squeezing pressure burning chest.  Still very active walks a lot have no difficulty doing it.  She is not on any special diet but after conversation with her I realize she is taking with fairly good diet that meets criteria of Mediterranean diet.  Past Medical History:  Diagnosis Date   Cataract    Colon polyps 06/18/2013   Episodic lightheadedness 02/21/2021   Fatty liver    Fibrocystic breast disease 11/20/2015   Has breast tenderness on right   Hair thinning 12/18/2017   Herpes zoster without complication [id number]   History of fusion of cervical spine 06/04/2019   Hx of adenomatous colonic polyps 06/18/2013   Overview:  Had colonoscopy 06/18/2013 added new dx of colon polyps and Diverticuiosis    Hyperlipidemia    Hypertension    Osteopenia after menopause 2019   Prediabetes 12/20/2017    Past Surgical History:  Procedure Laterality Date   ABDOMINAL HYSTERECTOMY     prolapse   ANTERIOR CERVICAL DECOMP/DISCECTOMY FUSION N/A 05/14/2019   Procedure: ANTERIOR CERVICAL DECOMPRESSION/DISCECTOMY FUSION CERVICAL FIVE- CERVICAL SIX, CERVICAL SIX- CERVICAL SEVEN, CERVICAL SEVEN- THORACIC  ONE;  Surgeon: Jovita Gamma, MD;  Location: [facility];  Service: Neurosurgery;  Laterality: N/A;  ANTERIOR CERVICAL DECOMPRESSION/DISCECTOMY FUSION CERVICAL 5- CERVICAL 6, CERVICAL 6- CERVICAL 7, CERVICAL 7- THORACIC 1   CHOLECYSTECTOMY     COLONOSCOPY  06/18/2013   COLONOSCOPY WITH PROPOFOL  07/18/2018   Dr.Gessner   GALLBLADDER SURGERY  07/06/2012   TONSILLECTOMY  03/12/1952   TUBAL LIGATION      Current Medications: Current Meds  Medication Sig   alendronate (FOSAMAX) 70 MG tablet TAKE 1 TABLET BY MOUTH EVERY 7 (SEVEN) DAYS. TAKE WITH A FULL GLASS OF WATER ON AN EMPTY STOMACH.   aspirin 81 MG chewable tablet Chew 81 mg by mouth at bedtime.   atenolol (TENORMIN) 50 MG tablet TAKE 1 TABLET BY MOUTH EVERY DAY   BIOTIN PO Take 5,000 mcg by mouth daily.   CALCIUM-VITAMIN D PO Take 1 tablet by mouth 2 (two) times daily. 1259m / 1000 units   fenofibrate (TRICOR) 145 MG tablet TAKE 1 TABLET BY MOUTH EVERY DAY   hydrocortisone (ANUSOL-HC) 2.5 % rectal cream Apply 1 application topically 2 (two) times daily.   Omega-3 Fatty Acids (FISH OIL) 1000 MG CAPS Take 1 capsule by mouth daily.   rosuvastatin (CRESTOR) 20 MG tablet Take 1 tablet (20 mg total) by mouth daily.     Allergies:   Lipitor [atorvastatin]   Social History   Socioeconomic History   Marital status: Married    Spouse name: Not on file   Number of children:  Not on file   Years of education: Not on file   Highest education level: Not on file  Occupational History   Not on file  Tobacco Use   Smoking status: Never   Smokeless tobacco: Never  Vaping Use   Vaping Use: Never used  Substance and Sexual Activity   Alcohol use: No   Drug use: No   Sexual activity: Not Currently  Other Topics Concern   Not on file  Social History Narrative   Married   Exercise: gardening, mows lawn, [place] [facility] [employer] [facility] [place]   No alcohol tobacco or drug use   Social Determinants of Health   Financial Resource Strain: Low Risk   (11/15/2020)   Overall Financial Resource Strain (CARDIA)    Difficulty of Paying Living Expenses: Not hard at all  Food Insecurity: No Food Insecurity (11/15/2020)   Hunger Vital Sign    Worried About Running Out of Food in the Last Year: Never true    Ran Out of Food in the Last Year: Never true  Transportation Needs: No Transportation Needs (11/15/2020)   PRAPARE - Hydrologist (Medical): No    Lack of Transportation (Non-Medical): No  Physical Activity: Sufficiently Active (11/15/2020)   Exercise Vital Sign    Days of Exercise per Week: 5 days    Minutes of Exercise per Session: 30 min  Stress: No Stress Concern Present (11/15/2020)   [place]    Feeling of Stress : Not at all  Social Connections: [place] (11/15/2020)   Social Connection and Isolation Panel [NHANES]    Frequency of Communication with Friends and Family: Three times a week    Frequency of Social Gatherings with Friends and Family: Once a week    Attends Religious Services: More than 4 times per year    Active Member of [employer] or Organizations: Yes    Attends Music therapist: More than 4 times per year    Marital Status: Married     Family History: The patient's family history includes Diabetes in her father; Heart disease in her father; Hyperlipidemia in her brother and mother; Hypertension in her brother; Miscarriages / [place] in her maternal grandmother and paternal grandfather; Parkinson's disease in her father. There is no history of Colon cancer, Stomach cancer, Colon polyps, Esophageal cancer, or Rectal cancer. ROS:   Please see the history of present illness.    All 14 point review of systems negative except as described per history of present illness  EKGs/Labs/Other Studies Reviewed:      Recent Labs: 02/22/2022: ALT 26; BUN 20; Creatinine, Ser 1.03; Hemoglobin 13.2; Platelets  233.0; Potassium 4.0; Sodium 141; TSH 1.96  Recent Lipid Panel    Component Value Date/Time   CHOL 154 02/22/2022 1039   TRIG 64.0 02/22/2022 1039   HDL 42.80 02/22/2022 1039   CHOLHDL 4 02/22/2022 1039   VLDL 12.8 02/22/2022 1039   LDLCALC 98 02/22/2022 1039    Physical Exam:    VS:  BP 130/70   Pulse 70   Ht 5' 2"$  (1.575 m)   Wt 138 lb (62.6 kg)   SpO2 96%   BMI 25.24 kg/m     Wt Readings from Last 3 Encounters:  06/20/22 138 lb (62.6 kg)  04/12/22 137 lb (62.1 kg)  03/16/22 137 lb 6.4 oz (62.3 kg)     GEN:  Well nourished, well developed in no acute distress  HEENT: Normal NECK: No JVD; No carotid bruits LYMPHATICS: No lymphadenopathy CARDIAC: RRR, no murmurs, no rubs, no gallops RESPIRATORY:  Clear to auscultation without rales, wheezing or rhonchi  ABDOMEN: Soft, non-tender, non-distended MUSCULOSKELETAL:  No edema; No deformity  SKIN: Warm and dry LOWER EXTREMITIES: no swelling NEUROLOGIC:  Alert and oriented x 3 PSYCHIATRIC:  Normal affect   ASSESSMENT:    1. Bilateral carotid artery stenosis   2. Agatston coronary artery calcium score of 1418,   02/2022   3. Atherosclerosis of aorta ([place])   4. Primary hypertension   5. Mixed hyperlipidemia    PLAN:    In order of problems listed above:  Coronary artery disease appears to be nonobstructive.  She is asymptomatic the key is risk factors modifications, will continue antiplatelet therapy, will reduce her cholesterol. Dyslipidemia.  She is taking Crestor 20 as well as Tricor, I did review K PN which show me data from October of last year with LDL 98 HDL 42 however that was on nine 5 mg Crestor, will ask her to have fasting lipid profile done today as well as LP(a). Carotic arterial disease, no TIA CVA-like symptoms.  Will repeat carotic ultrasounds in a year. We did talk about healthy lifestyle need to exercise on the regular basis as well as we discussed basic of Mediterranean diet   Medication  Adjustments/Labs and Tests Ordered: Current medicines are reviewed at length with the patient today.  Concerns regarding medicines are outlined above.  No orders of the defined types were placed in this encounter.  Medication changes: No orders of the defined types were placed in this encounter.   Signed, Park Liter, MD, [facility] 06/20/2022 12:57 PM    [place]

## 2022-06-21 LAB — AST: AST: 40 IU/L (ref 0–40)

## 2022-06-21 LAB — LIPID PANEL
Chol/HDL Ratio: 2.7 ratio (ref 0.0–4.4)
Cholesterol, Total: 120 mg/dL (ref 100–199)
HDL: 45 mg/dL (ref >39)
LDL Chol Calc (NIH): 60 mg/dL (ref 0–99)
Triglycerides: 75 mg/dL (ref 0–149)
VLDL Cholesterol Cal: 15 mg/dL (ref 5–40)

## 2022-06-21 LAB — LIPOPROTEIN A (LPA): Lipoprotein (a): 16.2 nmol/L (ref <75.0)

## 2022-06-21 LAB — ALT: ALT: 25 IU/L (ref 0–32)

## 2022-06-22 ENCOUNTER — Telehealth: Payer: Self-pay

## 2022-06-22 NOTE — Telephone Encounter (Signed)
Results reviewed with pt as per Dr. Krasowski's note.  Pt verbalized understanding and had no additional questions. Routed to PCP  

## 2022-08-23 ENCOUNTER — Encounter: Payer: Self-pay | Admitting: Internal Medicine

## 2022-08-23 DIAGNOSIS — I2510 Atherosclerotic heart disease of native coronary artery without angina pectoris: Secondary | ICD-10-CM | POA: Insufficient documentation

## 2022-08-23 HISTORY — DX: Atherosclerotic heart disease of native coronary artery without angina pectoris: I25.10

## 2022-08-23 NOTE — Patient Instructions (Addendum)
      Blood work was ordered.   The lab is on the first floor.    Medications changes include :   none      Return in about 6 months (around 02/23/2023) for Physical Exam.

## 2022-08-23 NOTE — Progress Notes (Unsigned)
Subjective:    Patient ID: Morgan Hancock, female    DOB: November 17, 1945, 77 y.o.   MRN: [id number]     HPI Morgan Hancock is here for follow up of her chronic medical problems.  Walks daily  Eating ok - could do better with her diet.    Medications and allergies reviewed with patient and updated if appropriate.  Current Outpatient Medications on File Prior to Visit  Medication Sig Dispense Refill   alendronate (FOSAMAX) 70 MG tablet TAKE 1 TABLET BY MOUTH EVERY 7 (SEVEN) DAYS. TAKE WITH A FULL GLASS OF WATER ON AN EMPTY STOMACH. 12 tablet 3   aspirin 81 MG chewable tablet Chew 81 mg by mouth at bedtime.     atenolol (TENORMIN) 50 MG tablet TAKE 1 TABLET BY MOUTH EVERY DAY 90 tablet 3   BIOTIN PO Take 5,000 mcg by mouth daily.     CALCIUM-VITAMIN D PO Take 1 tablet by mouth 2 (two) times daily.  / 1000 units     fenofibrate (TRICOR) 145 MG tablet TAKE 1 TABLET BY MOUTH EVERY DAY 90 tablet 3   hydrocortisone (ANUSOL-HC) 2.5 % rectal cream Apply 1 application topically 2 (two) times daily.     Omega-3 Fatty Acids (FISH OIL) 1000 MG CAPS Take 1 capsule by mouth daily.     rosuvastatin (CRESTOR) 20 MG tablet Take 1 tablet (20 mg total) by mouth daily. 90 tablet 3   No current facility-administered medications on file prior to visit.     Review of Systems  Constitutional:  Negative for fever.  Respiratory:  Negative for cough, shortness of breath and wheezing.   Cardiovascular:  Negative for chest pain, palpitations and leg swelling.  Neurological:  Positive for light-headedness (transient with standing quickly or quick movements). Negative for headaches.       Objective:   Vitals:   08/24/22 1001  BP: 118/80  Pulse: (!) 59  Temp: 98 F (36.7 C)  SpO2: 98%   BP Readings from Last 3 Encounters:  08/24/22 118/80  06/20/22 130/70  03/16/22 (!) 170/80   Wt Readings from Last 3 Encounters:  08/24/22 133 lb (60.3 kg)  06/20/22 138 lb (62.6 kg)  04/12/22 137 lb (62.1  kg)   Body mass index is 24.33 kg/m.    Physical Exam Constitutional:      General: She is not in acute distress.    Appearance: Normal appearance.  HENT:     Head: Normocephalic and atraumatic.  Eyes:     Conjunctiva/sclera: Conjunctivae normal.  Cardiovascular:     Rate and Rhythm: Normal rate and regular rhythm.     Heart sounds: Normal heart sounds.  Pulmonary:     Effort: Pulmonary effort is normal. No respiratory distress.     Breath sounds: Normal breath sounds. No wheezing.  Musculoskeletal:     Cervical back: Neck supple.     Right lower leg: No edema.     Left lower leg: No edema.  Lymphadenopathy:     Cervical: No cervical adenopathy.  Skin:    General: Skin is warm and dry.     Findings: No rash.  Neurological:     Mental Status: She is alert. Mental status is at baseline.  Psychiatric:        Mood and Affect: Mood normal.        Behavior: Behavior normal.       Diabetic Foot Exam - Simple   Simple Foot Form Diabetic Foot exam  was performed with the following findings: Yes 08/24/2022 10:40 AM  Visual Inspection No deformities, no ulcerations, no other skin breakdown bilaterally: Yes Sensation Testing Intact to touch and monofilament testing bilaterally: Yes Pulse Check Posterior Tibialis and Dorsalis pulse intact bilaterally: Yes Comments      Lab Results  Component Value Date   WBC 6.0 02/22/2022   HGB 13.2 02/22/2022   HCT 39.9 02/22/2022   PLT 233.0 02/22/2022   GLUCOSE 93 02/22/2022   CHOL 120 06/20/2022   TRIG 75 06/20/2022   HDL 45 06/20/2022   LDLCALC 60 06/20/2022   ALT 25 06/20/2022   AST 40 06/20/2022   NA 141 02/22/2022   K 4.0 02/22/2022   CL 106 02/22/2022   CREATININE 1.03 02/22/2022   BUN 20 02/22/2022   CO2 28 02/22/2022   TSH 1.96 02/22/2022   HGBA1C 6.7 (H) 02/22/2022     Assessment & Plan:    See Problem List for Assessment and Plan of chronic medical problems.

## 2022-08-24 ENCOUNTER — Ambulatory Visit (INDEPENDENT_AMBULATORY_CARE_PROVIDER_SITE_OTHER): Payer: Medicare Other | Admitting: Internal Medicine

## 2022-08-24 VITALS — BP 118/80 | HR 59 | Temp 98.0°F | Ht 62.0 in | Wt 133.0 lb

## 2022-08-24 DIAGNOSIS — E559 Vitamin D deficiency, unspecified: Secondary | ICD-10-CM

## 2022-08-24 DIAGNOSIS — M85852 Other specified disorders of bone density and structure, left thigh: Secondary | ICD-10-CM

## 2022-08-24 DIAGNOSIS — E1169 Type 2 diabetes mellitus with other specified complication: Secondary | ICD-10-CM | POA: Diagnosis not present

## 2022-08-24 DIAGNOSIS — R7303 Prediabetes: Secondary | ICD-10-CM

## 2022-08-24 DIAGNOSIS — I6523 Occlusion and stenosis of bilateral carotid arteries: Secondary | ICD-10-CM

## 2022-08-24 DIAGNOSIS — I10 Essential (primary) hypertension: Secondary | ICD-10-CM

## 2022-08-24 DIAGNOSIS — I2510 Atherosclerotic heart disease of native coronary artery without angina pectoris: Secondary | ICD-10-CM | POA: Diagnosis not present

## 2022-08-24 DIAGNOSIS — I700 Atherosclerosis of aorta: Secondary | ICD-10-CM

## 2022-08-24 DIAGNOSIS — E782 Mixed hyperlipidemia: Secondary | ICD-10-CM

## 2022-08-24 DIAGNOSIS — M85851 Other specified disorders of bone density and structure, right thigh: Secondary | ICD-10-CM | POA: Diagnosis not present

## 2022-08-24 LAB — CBC WITH DIFFERENTIAL/PLATELET
Basophils Absolute: 0.1 10*3/uL (ref 0.0–0.1)
Basophils Relative: 1 % (ref 0.0–3.0)
Eosinophils Absolute: 0.2 10*3/uL (ref 0.0–0.7)
Eosinophils Relative: 4.7 % (ref 0.0–5.0)
HCT: 40.6 % (ref 36.0–46.0)
Hemoglobin: 13.8 g/dL (ref 12.0–15.0)
Lymphocytes Relative: 29.7 % (ref 12.0–46.0)
Lymphs Abs: 1.6 10*3/uL (ref 0.7–4.0)
MCHC: 34 g/dL (ref 30.0–36.0)
MCV: 91.3 fl (ref 78.0–100.0)
Monocytes Absolute: 0.4 10*3/uL (ref 0.1–1.0)
Monocytes Relative: 6.8 % (ref 3.0–12.0)
Neutro Abs: 3.1 10*3/uL (ref 1.4–7.7)
Neutrophils Relative %: 57.8 % (ref 43.0–77.0)
Platelets: 200 10*3/uL (ref 150.0–400.0)
RBC: 4.44 Mil/uL (ref 3.87–5.11)
RDW: 14.6 % (ref 11.5–15.5)
WBC: 5.3 10*3/uL (ref 4.0–10.5)

## 2022-08-24 LAB — COMPREHENSIVE METABOLIC PANEL
ALT: 30 U/L (ref 0–35)
AST: 51 U/L — ABNORMAL HIGH (ref 0–37)
Albumin: 4.3 g/dL (ref 3.5–5.2)
Alkaline Phosphatase: 40 U/L (ref 39–117)
BUN: 20 mg/dL (ref 6–23)
CO2: 28 mEq/L (ref 19–32)
Calcium: 9.6 mg/dL (ref 8.4–10.5)
Chloride: 105 mEq/L (ref 96–112)
Creatinine, Ser: 0.99 mg/dL (ref 0.40–1.20)
GFR: 55.29 mL/min — ABNORMAL LOW (ref >60.00)
Glucose, Bld: 86 mg/dL (ref 70–99)
Potassium: 4.4 mEq/L (ref 3.5–5.1)
Sodium: 141 mEq/L (ref 135–145)
Total Bilirubin: 0.6 mg/dL (ref 0.2–1.2)
Total Protein: 7.3 g/dL (ref 6.0–8.3)

## 2022-08-24 LAB — MICROALBUMIN / CREATININE URINE RATIO
Creatinine,U: 129 mg/dL
Microalb Creat Ratio: 1 mg/g (ref 0.0–30.0)
Microalb, Ur: 1.3 mg/dL (ref 0.0–1.9)

## 2022-08-24 LAB — VITAMIN D 25 HYDROXY (VIT D DEFICIENCY, FRACTURES): VITD: 41.68 ng/mL (ref 30.00–100.00)

## 2022-08-24 LAB — TSH: TSH: 1.29 u[IU]/mL (ref 0.35–5.50)

## 2022-08-24 LAB — HEMOGLOBIN A1C: Hgb A1c MFr Bld: 6.3 % (ref 4.6–6.5)

## 2022-08-24 NOTE — Assessment & Plan Note (Signed)
Chronic Minimal in b/l ICA's Continue crestor 20 mg daily, fenofibrate 145 mg daily Stressed healthy diet, regular exercise

## 2022-08-24 NOTE — Assessment & Plan Note (Addendum)
Chronic Regular exercise and healthy diet encouraged LDL well control Continue Crestor 20 mg twice weekly, fenofibrate 145 mg daily

## 2022-08-24 NOTE — Assessment & Plan Note (Signed)
Chronic Continue crestor 20 mg daily, fenofibrate 145 mg daily Regular exercise, healthy diet

## 2022-08-24 NOTE — Assessment & Plan Note (Signed)
Chronic Taking vitamin d daily Check vitamin d level  

## 2022-08-24 NOTE — Assessment & Plan Note (Signed)
Chronic °Blood pressure well controlled °CMP °Continue atenolol 50 mg daily °

## 2022-08-24 NOTE — Assessment & Plan Note (Addendum)
New dx last fall  Lab Results  Component Value Date   HGBA1C 6.7 (H) 02/22/2022   Sugars controlled Check A1c, urine microalbumin today Continue lifestyle control Stressed regular exercise, diabetic diet

## 2022-08-24 NOTE — Assessment & Plan Note (Signed)
Chronic CAC score 1418 2023 Following with cardiology Stress test 04/2022 negative Stressed healthy diet, regular exercise BP controlled Stressed sugar control Cmp, cbc  Lab Results  Component Value Date   LDLCALC 60 06/20/2022

## 2022-08-24 NOTE — Assessment & Plan Note (Signed)
Chronic DEXA  up to date - done 02/2022 - improved Fosamax started 03/2020-plan to continue for 5 years Continue calcium and vitamin D supplementation Continue regular exercise Check vitamin D level, CMP, CBC

## 2022-09-07 DIAGNOSIS — R7303 Prediabetes: Secondary | ICD-10-CM | POA: Diagnosis not present

## 2022-09-07 DIAGNOSIS — H2513 Age-related nuclear cataract, bilateral: Secondary | ICD-10-CM | POA: Diagnosis not present

## 2022-11-16 ENCOUNTER — Ambulatory Visit (INDEPENDENT_AMBULATORY_CARE_PROVIDER_SITE_OTHER): Payer: Medicare Other

## 2022-11-16 VITALS — Ht 62.0 in | Wt 132.0 lb

## 2022-11-16 DIAGNOSIS — Z0000 Encounter for general adult medical examination without abnormal findings: Secondary | ICD-10-CM

## 2022-11-16 NOTE — Patient Instructions (Addendum)
Morgan Hancock , Thank you for taking time to come for your Medicare Wellness Visit. I appreciate your ongoing commitment to your health goals. Please review the following plan we discussed and let me know if I can assist you in the future.   These are the goals we discussed:  Goals       DIET - INCREASE WATER INTAKE (pt-stated)      I would like to lose weight.  My ideal weight goal is to be 125-129 pounds.        This is a list of the screening recommended for you and due dates:  Health Maintenance  Topic Date Due   Eye exam for diabetics  Never done   DTaP/Tdap/Td vaccine (2 - Td or Tdap) 05/30/2021   COVID-19 Vaccine (4 - 2023-24 season) 01/06/2022   Flu Shot  12/07/2022   Hemoglobin A1C  02/23/2023   Yearly kidney function blood test for diabetes  08/24/2023   Yearly kidney health urinalysis for diabetes  08/24/2023   Complete foot exam   08/24/2023   Medicare Annual Wellness Visit  11/16/2023   DEXA scan (bone density measurement)  03/03/2024   Pneumonia Vaccine  Completed   Hepatitis C Screening  Completed   HPV Vaccine  Aged Out   Colon Cancer Screening  Discontinued   Zoster (Shingles) Vaccine  Discontinued    Advanced directives: Yes; documents on file with [place].  Conditions/risks identified: Yes  Next appointment: It was nice speaking with you today!  Please follow up in one year for your annual wellness visit via telephone call with Nurse Percell Miller on 11/19/2023 at 10:30 a.m.  If you need to cancel or reschedule please call [phone].   Preventive Care 77 Years and Older, Female Preventive care refers to lifestyle choices and visits with your health care provider that can promote health and wellness. What does preventive care include? A yearly physical exam. This is also called an annual well check. Dental exams once or twice a year. Routine eye exams. Ask your health care provider how often you should have your eyes checked. Personal lifestyle choices,  including: Daily care of your teeth and gums. Regular physical activity. Eating a healthy diet. Avoiding tobacco and drug use. Limiting alcohol use. Practicing safe sex. Taking low-dose aspirin every day. Taking vitamin and mineral supplements as recommended by your health care provider. What happens during an annual well check? The services and screenings done by your health care provider during your annual well check will depend on your age, overall health, lifestyle risk factors, and family history of disease. Counseling  Your health care provider may ask you questions about your: Alcohol use. Tobacco use. Drug use. Emotional well-being. Home and relationship well-being. Sexual activity. Eating habits. History of falls. Memory and ability to understand (cognition). Work and work Astronomer. Reproductive health. Screening  You may have the following tests or measurements: Height, weight, and BMI. Blood pressure. Lipid and cholesterol levels. These may be checked every 5 years, or more frequently if you are over 82 years old. Skin check. Lung cancer screening. You may have this screening every year starting at age 25 if you have a 30-pack-year history of smoking and currently smoke or have quit within the past 15 years. Fecal occult blood test (FOBT) of the stool. You may have this test every year starting at age 20. Flexible sigmoidoscopy or colonoscopy. You may have a sigmoidoscopy every 5 years or a colonoscopy every 10 years starting at  age 65. Hepatitis C blood test. Hepatitis B blood test. Sexually transmitted disease (STD) testing. Diabetes screening. This is done by checking your blood sugar (glucose) after you have not eaten for a while (fasting). You may have this done every 1-3 years. Bone density scan. This is done to screen for osteoporosis. You may have this done starting at age 63. Mammogram. This may be done every 1-2 years. Talk to your health care provider  about how often you should have regular mammograms. Talk with your health care provider about your test results, treatment options, and if necessary, the need for more tests. Vaccines  Your health care provider may recommend certain vaccines, such as: Influenza vaccine. This is recommended every year. Tetanus, diphtheria, and acellular pertussis (Tdap, Td) vaccine. You may need a Td booster every 10 years. Zoster vaccine. You may need this after age 86. Pneumococcal 13-valent conjugate (PCV13) vaccine. One dose is recommended after age 57. Pneumococcal polysaccharide (PPSV23) vaccine. One dose is recommended after age 38. Talk to your health care provider about which screenings and vaccines you need and how often you need them. This information is not intended to replace advice given to you by your health care provider. Make sure you discuss any questions you have with your health care provider. Document Released: 05/21/2015 Document Revised: 01/12/2016 Document Reviewed: 02/23/2015 Elsevier Interactive Patient Education  2017 [employer].  Fall Prevention in the Home Falls can cause injuries. They can happen to people of all ages. There are many things you can do to make your home safe and to help prevent falls. What can I do on the outside of my home? Regularly fix the edges of walkways and driveways and fix any cracks. Remove anything that might make you trip as you walk through a door, such as a raised step or threshold. Trim any bushes or trees on the path to your home. Use bright outdoor lighting. Clear any walking paths of anything that might make someone trip, such as rocks or tools. Regularly check to see if handrails are loose or broken. Make sure that both sides of any steps have handrails. Any raised decks and porches should have guardrails on the edges. Have any leaves, snow, or ice cleared regularly. Use sand or salt on walking paths during winter. Clean up any spills in  your garage right away. This includes oil or grease spills. What can I do in the bathroom? Use night lights. Install grab bars by the toilet and in the tub and shower. Do not use towel bars as grab bars. Use non-skid mats or decals in the tub or shower. If you need to sit down in the shower, use a plastic, non-slip stool. Keep the floor dry. Clean up any water that spills on the floor as soon as it happens. Remove soap buildup in the tub or shower regularly. Attach bath mats securely with double-sided non-slip rug tape. Do not have throw rugs and other things on the floor that can make you trip. What can I do in the bedroom? Use night lights. Make sure that you have a light by your bed that is easy to reach. Do not use any sheets or blankets that are too big for your bed. They should not hang down onto the floor. Have a firm chair that has side arms. You can use this for support while you get dressed. Do not have throw rugs and other things on the floor that can make you trip. What can I  do in the kitchen? Clean up any spills right away. Avoid walking on wet floors. Keep items that you use a lot in easy-to-reach places. If you need to reach something above you, use a strong step stool that has a grab bar. Keep electrical cords out of the way. Do not use floor polish or wax that makes floors slippery. If you must use wax, use non-skid floor wax. Do not have throw rugs and other things on the floor that can make you trip. What can I do with my stairs? Do not leave any items on the stairs. Make sure that there are handrails on both sides of the stairs and use them. Fix handrails that are broken or loose. Make sure that handrails are as long as the stairways. Check any carpeting to make sure that it is firmly attached to the stairs. Fix any carpet that is loose or worn. Avoid having throw rugs at the top or bottom of the stairs. If you do have throw rugs, attach them to the floor with carpet  tape. Make sure that you have a light switch at the top of the stairs and the bottom of the stairs. If you do not have them, ask someone to add them for you. What else can I do to help prevent falls? Wear shoes that: Do not have high heels. Have rubber bottoms. Are comfortable and fit you well. Are closed at the toe. Do not wear sandals. If you use a stepladder: Make sure that it is fully opened. Do not climb a closed stepladder. Make sure that both sides of the stepladder are locked into place. Ask someone to hold it for you, if possible. Clearly mark and make sure that you can see: Any grab bars or handrails. First and last steps. Where the edge of each step is. Use tools that help you move around (mobility aids) if they are needed. These include: Canes. Walkers. Scooters. Crutches. Turn on the lights when you go into a dark area. Replace any light bulbs as soon as they burn out. Set up your furniture so you have a clear path. Avoid moving your furniture around. If any of your floors are uneven, fix them. If there are any pets around you, be aware of where they are. Review your medicines with your doctor. Some medicines can make you feel dizzy. This can increase your chance of falling. Ask your doctor what other things that you can do to help prevent falls. This information is not intended to replace advice given to you by your health care provider. Make sure you discuss any questions you have with your health care provider. Document Released: 02/18/2009 Document Revised: 09/30/2015 Document Reviewed: 05/29/2014 Elsevier Interactive Patient Education  2017 [employer].

## 2022-11-16 NOTE — Progress Notes (Signed)
Subjective:   Morgan Hancock is a 77 y.o. female who presents for Medicare Annual (Subsequent) preventive examination.  Visit Complete: Virtual  I connected with  Sondra Barges on 11/16/22 by a audio enabled telemedicine application and verified that I am speaking with the correct person using two identifiers.  Patient Location: Home  Provider Location: Office/Clinic  I discussed the limitations of evaluation and management by telemedicine. The patient expressed understanding and agreed to proceed.  Review of Systems     Cardiac Risk Factors include: advanced age (>20men, >20 women);family history of premature cardiovascular disease;dyslipidemia;hypertension     Objective:    Today's Vitals   11/16/22 0952  Weight: 132 lb (59.9 kg)  Height: 5\' 2"  (1.575 m)  PainSc: 2   PainLoc: Shoulder   Body mass index is 24.14 kg/m.     11/16/2022    9:56 AM 12/21/2021   11:50 AM 11/15/2020   10:57 AM 05/12/2019    1:11 PM 09/19/2017    1:26 PM  Advanced Directives  Does Patient Have a Medical Advance Directive? Yes Yes Yes Yes Yes  Type of Estate agent of [place];Living will Healthcare Power of [place];Living will  Healthcare Power of [place];Living will Healthcare Power of [place];Living will  Does patient want to make changes to medical advance directive? No - Patient declined No - Patient declined No - Patient declined No - Patient declined   Copy of Healthcare Power of Attorney in Chart? Yes - validated most recent copy scanned in chart (See row information) No - copy requested   No - copy requested    Current Medications (verified) Outpatient Encounter Medications as of 11/16/2022  Medication Sig   alendronate (FOSAMAX) 70 MG tablet TAKE 1 TABLET BY MOUTH EVERY 7 (SEVEN) DAYS. TAKE WITH A FULL GLASS OF WATER ON AN EMPTY STOMACH.   aspirin 81 MG chewable tablet Chew 81 mg by mouth at bedtime.   atenolol (TENORMIN) 50 MG tablet TAKE 1 TABLET BY MOUTH  EVERY DAY   BIOTIN PO Take 5,000 mcg by mouth daily.   CALCIUM-VITAMIN D PO Take 1 tablet by mouth 2 (two) times daily. 1200mg  / 1000 units   fenofibrate (TRICOR) 145 MG tablet TAKE 1 TABLET BY MOUTH EVERY DAY   hydrocortisone (ANUSOL-HC) 2.5 % rectal cream Apply 1 application topically 2 (two) times daily.   Omega-3 Fatty Acids (FISH OIL) 1000 MG CAPS Take 1 capsule by mouth daily.   rosuvastatin (CRESTOR) 20 MG tablet Take 1 tablet (20 mg total) by mouth daily.   No facility-administered encounter medications on file as of 11/16/2022.    Allergies (verified) Lipitor [atorvastatin]   History: Past Medical History:  Diagnosis Date   Cataract    Colon polyps 06/18/2013   Episodic lightheadedness 02/21/2021   Fatty liver    Fibrocystic breast disease 11/20/2015   Has breast tenderness on right   Hair thinning 12/18/2017   Herpes zoster without complication 09/24/2019   History of fusion of cervical spine 06/04/2019   Hx of adenomatous colonic polyps 06/18/2013   Overview:  Had colonoscopy 06/18/2013 added new dx of colon polyps and Diverticuiosis    Hyperlipidemia    Hypertension    Osteopenia after menopause 2019   Prediabetes 12/20/2017   Past Surgical History:  Procedure Laterality Date   ABDOMINAL HYSTERECTOMY     prolapse   ANTERIOR CERVICAL DECOMP/DISCECTOMY FUSION N/A 05/14/2019   Procedure: ANTERIOR CERVICAL DECOMPRESSION/DISCECTOMY FUSION CERVICAL FIVE- CERVICAL SIX, CERVICAL SIX- CERVICAL SEVEN,  CERVICAL SEVEN- THORACIC ONE;  Surgeon: Shirlean Kelly, MD;  Location: [facility];  Service: Neurosurgery;  Laterality: N/A;  ANTERIOR CERVICAL DECOMPRESSION/DISCECTOMY FUSION CERVICAL 5- CERVICAL 6, CERVICAL 6- CERVICAL 7, CERVICAL 7- THORACIC 1   CHOLECYSTECTOMY     COLONOSCOPY  06/18/2013   COLONOSCOPY WITH PROPOFOL  07/18/2018   Dr.Gessner   GALLBLADDER SURGERY  07/06/2012   TONSILLECTOMY  03/12/1952   TUBAL LIGATION     Family History  Problem Relation Age of Onset    Hyperlipidemia Mother    Heart disease Father    Diabetes Father    Parkinson's disease Father    Hyperlipidemia Brother    Hypertension Brother    Miscarriages / Stillbirths Maternal Grandmother    Miscarriages / Stillbirths Paternal Grandfather    Colon cancer Neg Hx    Stomach cancer Neg Hx    Colon polyps Neg Hx    Esophageal cancer Neg Hx    Rectal cancer Neg Hx    Social History   Socioeconomic History   Marital status: Married    Spouse name: Not on file   Number of children: Not on file   Years of education: Not on file   Highest education level: Bachelor's degree (e.g., BA, AB, BS)  Occupational History   Not on file  Tobacco Use   Smoking status: Never   Smokeless tobacco: Never  Vaping Use   Vaping status: Never Used  Substance and Sexual Activity   Alcohol use: No   Drug use: No   Sexual activity: Not Currently  Other Topics Concern   Not on file  Social History Narrative   Married   Exercise: gardening, mows lawn, [facility]   No alcohol tobacco or drug use   Social Determinants of Health   Financial Resource Strain: Low Risk  (11/16/2022)   Overall Financial Resource Strain (CARDIA)    Difficulty of Paying Living Expenses: Not hard at all  Food Insecurity: No Food Insecurity (11/16/2022)   Hunger Vital Sign    Worried About Running Out of Food in the Last Year: Never true    Ran Out of Food in the Last Year: Never true  Transportation Needs: No Transportation Needs (11/16/2022)   PRAPARE - Administrator, Civil Service (Medical): No    Lack of Transportation (Non-Medical): No  Physical Activity: Sufficiently Active (11/16/2022)   Exercise Vital Sign    Days of Exercise per Week: 5 days    Minutes of Exercise per Session: 30 min  Recent Concern: Physical Activity - Insufficiently Active (08/20/2022)   Exercise Vital Sign    Days of Exercise per Week: 3 days    Minutes of Exercise per Session: 10 min  Stress: No Stress  Concern Present (11/16/2022)   [employer] of Occupational Health - Occupational Stress Questionnaire    Feeling of Stress : Not at all  Social Connections: Socially Integrated (11/16/2022)   Social Connection and Isolation Panel [NHANES]    Frequency of Communication with Friends and Family: Three times a week    Frequency of Social Gatherings with Friends and Family: Three times a week    Attends Religious Services: More than 4 times per year    Active Member of Clubs or Organizations: Yes    Attends Banker Meetings: More than 4 times per year    Marital Status: Married    Tobacco Counseling Counseling given: Not Answered   Clinical Intake:  Pre-visit preparation completed:  Yes  Pain : No/denies pain Pain Score: 2      BMI - recorded: 24.14 Nutritional Status: BMI of 19-24  Normal Nutritional Risks: None Diabetes: No  How often do you need to have someone help you when you read instructions, pamphlets, or other written materials from your doctor or pharmacy?: 1 - Never What is the last grade level you completed in school?: BS in Early Childhood Eduaction  Interpreter Needed?: No  Information entered by :: Juanetta Negash N. Zenna Traister, LPN.   Activities of Daily Living    11/16/2022   10:00 AM  In your present state of health, do you have any difficulty performing the following activities:  Hearing? 0  Vision? 0  Difficulty concentrating or making decisions? 0  Walking or climbing stairs? 0  Dressing or bathing? 0  Doing errands, shopping? 0  Preparing Food and eating ? N  Using the Toilet? N  In the past six months, have you accidently leaked urine? Y  Comment wear protection  Do you have problems with loss of bowel control? N  Managing your Medications? N  Managing your Finances? N  Housekeeping or managing your Housekeeping? N    Patient Care Team: Pincus Sanes, MD as PCP - General (Internal Medicine) Yvette Rack, OD (Optometry) Iva Boop, MD as Consulting Physician (Gastroenterology) Marcelle Overlie, MD as Consulting Physician (Obstetrics and Gynecology)  Indicate any recent Medical Services you may have received from other than [facility] providers in the past year (date may be approximate).     Assessment:   This is a routine wellness examination for [place].  Hearing/Vision screen Hearing Screening - Comments:: Denies hearing difficulties.  Vision Screening - Comments:: Wears rx glasses - up to date with routine eye exams with Ray Hagar, OD.   Dietary issues and exercise activities discussed:     Goals Addressed               This Visit's Progress     DIET - INCREASE WATER INTAKE (pt-stated)        I would like to lose weight.  My ideal weight goal is to be 125-129 pounds.      Depression Screen    11/16/2022    9:59 AM 08/24/2022   10:11 AM 11/15/2020   10:55 AM 02/18/2020    8:43 AM 08/19/2019   10:41 AM 06/13/2018   11:13 AM 09/19/2017    1:26 PM  PHQ 2/9 Scores  PHQ - 2 Score 0 0 0 0 0 0 0  PHQ- 9 Score 0 0     0    Fall Risk    11/16/2022    9:58 AM 08/24/2022   10:11 AM 02/22/2022    9:47 AM 11/15/2020   10:47 AM 08/19/2019   10:40 AM  Fall Risk   Falls in the past year? 0 0 0 0 1  Number falls in past yr: 0 0 0 0 0  Injury with Fall? 0 0 0 0 0  Risk for fall due to : No Fall Risks No Fall Risks No Fall Risks No Fall Risks No Fall Risks  Risk for fall due to: Comment     tripped over a shoe  Follow up Falls prevention discussed Falls evaluation completed Falls evaluation completed Falls evaluation completed Falls evaluation completed;Education provided    MEDICARE RISK AT HOME:  Medicare Risk at Home - 11/16/22 0956     Any stairs in or around the home? No  ramp off the back of house   If so, are there any without handrails? No    Home free of loose throw rugs in walkways, pet beds, electrical cords, etc? Yes    Adequate lighting in your home to reduce risk of falls? Yes    Life  alert? No    Use of a cane, walker or w/c? No    Grab bars in the bathroom? Yes    Shower chair or bench in shower? Yes    Elevated toilet seat or a handicapped toilet? No             TIMED UP AND GO:  Was the test performed?  No    Cognitive Function:    09/19/2017    1:39 PM  MMSE - Mini Mental State Exam  Orientation to time 5  Orientation to Place 5  Registration 3  Attention/ Calculation 5  Recall 3  Language- name 2 objects 2  Language- repeat 1  Language- follow 3 step command 3  Language- read & follow direction 1  Write a sentence 1  Copy design 1  Total score 30        11/16/2022   10:04 AM 08/19/2019   10:42 AM  6CIT Screen  What Year? 0 points 0 points  What month? 0 points 0 points  What time? 0 points 0 points  Count back from 20 0 points 0 points  Months in reverse 0 points 0 points  Repeat phrase 0 points 0 points  Total Score 0 points 0 points    Immunizations Immunization History  Administered Date(s) Administered   Moderna Sars-Covid-2 Vaccination 06/25/2019, 07/23/2019, 05/05/2020   PPD Test 06/08/2014, 06/10/2015   Pneumococcal Conjugate-13 08/31/2014   Pneumococcal Polysaccharide-23 05/26/2005, 05/27/2013   Tdap 05/31/2011    TDAP status: Due, Education has been provided regarding the importance of this vaccine. Advised may receive this vaccine at local pharmacy or Health Dept. Aware to provide a copy of the vaccination record if obtained from local pharmacy or Health Dept. Verbalized acceptance and understanding.  Flu Vaccine status: Declined, Education has been provided regarding the importance of this vaccine but patient still declined. Advised may receive this vaccine at local pharmacy or Health Dept. Aware to provide a copy of the vaccination record if obtained from local pharmacy or Health Dept. Verbalized acceptance and understanding.  Pneumococcal vaccine status: Up to date  Covid-19 vaccine status: Completed  vaccines  Qualifies for Shingles Vaccine? Yes   Zostavax completed No   Shingrix Completed?: No.    Education has been provided regarding the importance of this vaccine. Patient has been advised to call insurance company to determine out of pocket expense if they have not yet received this vaccine. Advised may also receive vaccine at local pharmacy or Health Dept. Verbalized acceptance and understanding.  Screening Tests Health Maintenance  Topic Date Due   OPHTHALMOLOGY EXAM  Never done   DTaP/Tdap/Td (2 - Td or Tdap) 05/30/2021   COVID-19 Vaccine (4 - 2023-24 season) 01/06/2022   INFLUENZA VACCINE  12/07/2022   HEMOGLOBIN A1C  02/23/2023   Diabetic kidney evaluation - eGFR measurement  08/24/2023   Diabetic kidney evaluation - Urine ACR  08/24/2023   FOOT EXAM  08/24/2023   Medicare Annual Wellness (AWV)  11/16/2023   DEXA SCAN  03/03/2024   Pneumonia Vaccine 76+ Years old  Completed   Hepatitis C Screening  Completed   HPV VACCINES  Aged Out   Colonoscopy  Discontinued  Zoster Vaccines- Shingrix  Discontinued    Health Maintenance  Health Maintenance Due  Topic Date Due   OPHTHALMOLOGY EXAM  Never done   DTaP/Tdap/Td (2 - Td or Tdap) 05/30/2021   COVID-19 Vaccine (4 - 2023-24 season) 01/06/2022    Colorectal cancer screening: No longer required.   Mammogram status: Completed 05/18/2022. Repeat every year  Bone Density status: Completed 03/03/2022. Results reflect: Bone density results: OSTEOPOROSIS. Repeat every 2 years.  Lung Cancer Screening: (Low Dose CT Chest recommended if Age 77-80 years, 20 pack-year currently smoking OR have quit w/in 15years.) does not qualify.   Lung Cancer Screening Referral: no  Additional Screening:  Hepatitis C Screening: does qualify; Completed 06/11/2015  Vision Screening: Recommended annual ophthalmology exams for early detection of glaucoma and other disorders of the eye. Is the patient up to date with their annual eye exam?  Yes   Who is the provider or what is the name of the office in which the patient attends annual eye exams? Ray Hagar, OD. If pt is not established with a provider, would they like to be referred to a provider to establish care? No .   Dental Screening: Recommended annual dental exams for proper oral hygiene  Diabetic Foot Exam: Diabetic Foot Exam: Completed 08/24/2022  Community Resource Referral / Chronic Care Management: CRR required this visit?  No   CCM required this visit?  No     Plan:     I have personally reviewed and noted the following in the patient's chart:   Medical and social history Use of alcohol, tobacco or illicit drugs  Current medications and supplements including opioid prescriptions. Patient is not currently taking opioid prescriptions. Functional ability and status Nutritional status Physical activity Advanced directives List of other physicians Hospitalizations, surgeries, and ER visits in previous 12 months Vitals Screenings to include cognitive, depression, and falls Referrals and appointments  In addition, I have reviewed and discussed with patient certain preventive protocols, quality metrics, and best practice recommendations. A written personalized care plan for preventive services as well as general preventive health recommendations were provided to patient.     Mickeal Needy, LPN   1/61/0960   After Visit Summary: (Mail) Due to this being a telephonic visit, the after visit summary with patients personalized plan was offered to patient via mail   Nurse Notes: Normal cognitive status assessed by direct observation via telephone conversation by this Nurse Health Advisor. No abnormalities found.

## 2022-12-16 ENCOUNTER — Other Ambulatory Visit: Payer: Self-pay | Admitting: Internal Medicine

## 2022-12-20 ENCOUNTER — Ambulatory Visit: Payer: Medicare Other | Attending: Cardiology | Admitting: Cardiology

## 2022-12-20 ENCOUNTER — Encounter: Payer: Self-pay | Admitting: Cardiology

## 2022-12-20 VITALS — BP 132/60 | HR 62 | Ht 62.0 in | Wt 132.4 lb

## 2022-12-20 DIAGNOSIS — I6523 Occlusion and stenosis of bilateral carotid arteries: Secondary | ICD-10-CM | POA: Diagnosis not present

## 2022-12-20 DIAGNOSIS — R931 Abnormal findings on diagnostic imaging of heart and coronary circulation: Secondary | ICD-10-CM

## 2022-12-20 DIAGNOSIS — E782 Mixed hyperlipidemia: Secondary | ICD-10-CM

## 2022-12-20 DIAGNOSIS — I2510 Atherosclerotic heart disease of native coronary artery without angina pectoris: Secondary | ICD-10-CM | POA: Diagnosis not present

## 2022-12-20 MED ORDER — PRAVASTATIN SODIUM 40 MG PO TABS: 40.0000 mg | ORAL_TABLET | Freq: Every evening | ORAL | 3 refills | Status: DC

## 2022-12-20 NOTE — Patient Instructions (Addendum)
Medication Instructions:   START: Pravastatin 40mg  1 tablet daily  STOP: Rosuvastatin   Lab Work: Your physician recommends that you return for lab work in: 6 weeks You need to have labs done when you are fasting.  You can come Monday through Friday 8:30 am to 12:00 pm and 1:15 to 4:30. You do not need to make an appointment as the order has already been placed. The labs you are going to have done are AST, ALT  Lipids.    Testing/Procedures: In December Your physician has requested that you have a carotid duplex. This test is an ultrasound of the carotid arteries in your neck. It looks at blood flow through these arteries that supply the brain with blood. Allow one hour for this exam. There are no restrictions or special instructions.   Follow-Up: At [facility], you and your health needs are our priority.  As part of our continuing mission to provide you with exceptional heart care, we have created designated Provider Care Teams.  These Care Teams include your primary Cardiologist (physician) and Advanced Practice Providers (APPs -  Physician Assistants and Nurse Practitioners) who all work together to provide you with the care you need, when you need it.  We recommend signing up for the patient portal called "MyChart".  Sign up information is provided on this After Visit Summary.  MyChart is used to connect with patients for Virtual Visits (Telemedicine).  Patients are able to view lab/test results, encounter notes, upcoming appointments, etc.  Non-urgent messages can be sent to your provider as well.   To learn more about what you can do with MyChart, go to [email or web address].    Your next appointment:   6 month(s)  The format for your next appointment:   In Person  Provider:   Gypsy Balsam, MD    Other Instructions NA

## 2022-12-20 NOTE — Progress Notes (Signed)
Cardiology Office Note:    Date:  12/20/2022   ID:  Morgan Hancock, DOB 1946/04/12, MRN [id number]  PCP:  Morgan Sanes, MD  Cardiologist:  Morgan Balsam, MD    Referring MD: Morgan Sanes, MD   Chief Complaint  Patient presents with   Medication Management    History of Present Illness:    Morgan Hancock is a 77 y.o. female with past medical history significant for abnormal calcium score 1400, after that stress test was done which showed no evidence of ischemia.  Also carotic arterial disease up to 49% stenosis bilaterally.  She does have dyslipidemia but intolerant to rosuvastatin she stopped few months ago.  Essential hypertension.  Comes today to months for follow-up overall she is doing great.  This morning she was cutting grass push moderate no problem.  No chest pain tightness squeezing pressure burning chest  Past Medical History:  Diagnosis Date   Cataract    Colon polyps 06/18/2013   Episodic lightheadedness 02/21/2021   Fatty liver    Fibrocystic breast disease 11/20/2015   Has breast tenderness on right   Hair thinning 12/18/2017   Herpes zoster without complication 09/24/2019   History of fusion of cervical spine 06/04/2019   Hx of adenomatous colonic polyps 06/18/2013   Overview:  Had colonoscopy 06/18/2013 added new dx of colon polyps and Diverticuiosis    Hyperlipidemia    Hypertension    Osteopenia after menopause 2019   Prediabetes 12/20/2017    Past Surgical History:  Procedure Laterality Date   ABDOMINAL HYSTERECTOMY     prolapse   ANTERIOR CERVICAL DECOMP/DISCECTOMY FUSION N/A 05/14/2019   Procedure: ANTERIOR CERVICAL DECOMPRESSION/DISCECTOMY FUSION CERVICAL FIVE- CERVICAL SIX, CERVICAL SIX- CERVICAL SEVEN, CERVICAL SEVEN- THORACIC ONE;  Surgeon: Morgan Kelly, MD;  Location: [facility] OR;  Service: Neurosurgery;  Laterality: N/A;  ANTERIOR CERVICAL DECOMPRESSION/DISCECTOMY FUSION CERVICAL 5- CERVICAL 6, CERVICAL 6- CERVICAL 7, CERVICAL 7- THORACIC 1    CHOLECYSTECTOMY     COLONOSCOPY  06/18/2013   COLONOSCOPY WITH PROPOFOL  07/18/2018   Dr.Gessner   GALLBLADDER SURGERY  07/06/2012   TONSILLECTOMY  03/12/1952   TUBAL LIGATION      Current Medications: Current Meds  Medication Sig   alendronate (FOSAMAX) 70 MG tablet TAKE 1 TABLET BY MOUTH EVERY 7 (SEVEN) DAYS. TAKE WITH A FULL GLASS OF WATER ON AN EMPTY STOMACH. (Patient taking differently: Take 70 mg by mouth once a week.)   aspirin 81 MG chewable tablet Chew 81 mg by mouth at bedtime.   atenolol (TENORMIN) 50 MG tablet TAKE 1 TABLET BY MOUTH EVERY DAY (Patient taking differently: Take 50 mg by mouth daily.)   BIOTIN PO Take 5,000 mcg by mouth daily.   CALCIUM-VITAMIN D PO Take 1 tablet by mouth 2 (two) times daily. 1200mg  / 1000 units   fenofibrate (TRICOR) 145 MG tablet TAKE 1 TABLET BY MOUTH EVERY DAY   hydrocortisone (ANUSOL-HC) 2.5 % rectal cream Apply 1 application topically 2 (two) times daily.   Omega-3 Fatty Acids (FISH OIL) 1000 MG CAPS Take 1 capsule by mouth daily.   [DISCONTINUED] rosuvastatin (CRESTOR) 20 MG tablet Take 1 tablet (20 mg total) by mouth daily.     Allergies:   Lipitor [atorvastatin] and Rosuvastatin   Social History   Socioeconomic History   Marital status: Married    Spouse name: Not on file   Number of children: Not on file   Years of education: Not on file   Highest education level:  Bachelor's degree (e.g., BA, AB, BS)  Occupational History   Not on file  Tobacco Use   Smoking status: Never   Smokeless tobacco: Never  Vaping Use   Vaping status: Never Used  Substance and Sexual Activity   Alcohol use: No   Drug use: No   Sexual activity: Not Currently  Other Topics Concern   Not on file  Social History Narrative   Married   Exercise: gardening, mows lawn, [place] [facility] [employer] [facility]   No alcohol tobacco or drug use   Social Determinants of Health   Financial Resource Strain: Low Risk  (11/16/2022)   Overall Financial Resource  Strain (CARDIA)    Difficulty of Paying Living Expenses: Not hard at all  Food Insecurity: No Food Insecurity (11/16/2022)   Hunger Vital Sign    Worried About Running Out of Food in the Last Year: Never true    Ran Out of Food in the Last Year: Never true  Transportation Needs: No Transportation Needs (11/16/2022)   PRAPARE - Administrator, Civil Service (Medical): No    Lack of Transportation (Non-Medical): No  Physical Activity: Sufficiently Active (11/16/2022)   Exercise Vital Sign    Days of Exercise per Week: 5 days    Minutes of Exercise per Session: 30 min  Recent Concern: Physical Activity - Insufficiently Active (08/20/2022)   Exercise Vital Sign    Days of Exercise per Week: 3 days    Minutes of Exercise per Session: 10 min  Stress: No Stress Concern Present (11/16/2022)   [employer] of Occupational Health - Occupational Stress Questionnaire    Feeling of Stress : Not at all  Social Connections: Socially Integrated (11/16/2022)   Social Connection and Isolation Panel [NHANES]    Frequency of Communication with Friends and Family: Three times a week    Frequency of Social Gatherings with Friends and Family: Three times a week    Attends Religious Services: More than 4 times per year    Active Member of Clubs or Organizations: Yes    Attends Engineer, structural: More than 4 times per year    Marital Status: Married     Family History: The patient's family history includes Diabetes in her father; Heart disease in her father; Hyperlipidemia in her brother and mother; Hypertension in her brother; Miscarriages / [place] in her maternal grandmother and paternal grandfather; Parkinson's disease in her father. There is no history of Colon cancer, Stomach cancer, Colon polyps, Esophageal cancer, or Rectal cancer. ROS:   Please see the history of present illness.    All 14 point review of systems negative except as described per history of present  illness  EKGs/Labs/Other Studies Reviewed:         Recent Labs: 08/24/2022: ALT 30; BUN 20; Creatinine, Ser 0.99; Hemoglobin 13.8; Platelets 200.0; Potassium 4.4; Sodium 141; TSH 1.29  Recent Lipid Panel    Component Value Date/Time   CHOL 120 06/20/2022 1304   TRIG 75 06/20/2022 1304   HDL 45 06/20/2022 1304   CHOLHDL 2.7 06/20/2022 1304   CHOLHDL 4 02/22/2022 1039   VLDL 12.8 02/22/2022 1039   LDLCALC 60 06/20/2022 1304    Physical Exam:    VS:  BP 132/60 (BP Location: Left Arm, Patient Position: Sitting)   Pulse 62   Ht 5\' 2"  (1.575 m)   Wt 132 lb 6.4 oz (60.1 kg)   SpO2 93%   BMI 24.22 kg/m  Wt Readings from Last 3 Encounters:  12/20/22 132 lb 6.4 oz (60.1 kg)  11/16/22 132 lb (59.9 kg)  08/24/22 133 lb (60.3 kg)     GEN:  Well nourished, well developed in no acute distress HEENT: Normal NECK: No JVD; No carotid bruits LYMPHATICS: No lymphadenopathy CARDIAC: RRR, no murmurs, no rubs, no gallops RESPIRATORY:  Clear to auscultation without rales, wheezing or rhonchi  ABDOMEN: Soft, non-tender, non-distended MUSCULOSKELETAL:  No edema; No deformity  SKIN: Warm and dry LOWER EXTREMITIES: no swelling NEUROLOGIC:  Alert and oriented x 3 PSYCHIATRIC:  Normal affect   ASSESSMENT:    1. Agatston coronary artery calcium score of 1418,   02/2022   2. Coronary artery disease involving native coronary artery of native heart without angina pectoris   3. Bilateral carotid artery stenosis   4. Mixed hyperlipidemia    PLAN:    In order of problems listed above:  Elevated calcium score with negative stress test but he is risk factors modifications, she is already on antiplatelet therapy from a vascular which I will continue. Dyslipidemia she stopped Crestor because of muscle aches.  Will try pravastatin.  I will put her on 40 mg daily I warned him if she cannot take that medication he can let me know I will try different medications. Cardiac arterial stenosis we  will schedule her to have carotic ultrasounds in December of this year.   Medication Adjustments/Labs and Tests Ordered: Current medicines are reviewed at length with the patient today.  Concerns regarding medicines are outlined above.  No orders of the defined types were placed in this encounter.  Medication changes: No orders of the defined types were placed in this encounter.   Signed, Georgeanna Lea, MD, [facility] 12/20/2022 3:33 PM    [facility]

## 2022-12-20 NOTE — Addendum Note (Signed)
Addended by: Baldo Ash D on: 12/20/2022 03:44 PM   Modules accepted: Orders

## 2023-01-31 DIAGNOSIS — E782 Mixed hyperlipidemia: Secondary | ICD-10-CM | POA: Diagnosis not present

## 2023-02-01 ENCOUNTER — Other Ambulatory Visit: Payer: Self-pay | Admitting: Internal Medicine

## 2023-02-01 LAB — LIPID PANEL
Chol/HDL Ratio: 3.3 ratio (ref 0.0–4.4)
Cholesterol, Total: 150 mg/dL (ref 100–199)
HDL: 46 mg/dL (ref >39)
LDL Chol Calc (NIH): 89 mg/dL (ref 0–99)
Triglycerides: 75 mg/dL (ref 0–149)
VLDL Cholesterol Cal: 15 mg/dL (ref 5–40)

## 2023-02-01 LAB — AST: AST: 35 IU/L (ref 0–40)

## 2023-02-01 LAB — ALT: ALT: 23 IU/L (ref 0–32)

## 2023-02-02 ENCOUNTER — Telehealth: Payer: Self-pay

## 2023-02-02 MED ORDER — PRAVASTATIN SODIUM 40 MG PO TABS: 40.0000 mg | ORAL_TABLET | Freq: Every evening | ORAL | 3 refills | Status: DC

## 2023-02-02 NOTE — Telephone Encounter (Signed)
LMCM per Dr. Vanetta Shawl note continue current dose of Pravastatin and have Lipid, AST, ALT checked in 6 weeks.

## 2023-02-05 ENCOUNTER — Telehealth: Payer: Self-pay

## 2023-02-05 DIAGNOSIS — I2510 Atherosclerotic heart disease of native coronary artery without angina pectoris: Secondary | ICD-10-CM

## 2023-02-05 DIAGNOSIS — E782 Mixed hyperlipidemia: Secondary | ICD-10-CM

## 2023-02-05 MED ORDER — PRAVASTATIN SODIUM 80 MG PO TABS: 80.0000 mg | ORAL_TABLET | Freq: Every evening | ORAL | 0 refills | Status: DC

## 2023-02-05 NOTE — Telephone Encounter (Signed)
Patient notified of results and recommendations and agreed with plan, medication sent, lab order on file.   

## 2023-02-05 NOTE — Telephone Encounter (Signed)
-----   Message from Gypsy Balsam sent at 02/02/2023  9:19 AM EDT ----- Cholesterol still acceptable but little bit higher than it was before I propose to double the dose of pravastatin and fasting lipid profile, AST LT 6 weeks

## 2023-02-25 ENCOUNTER — Encounter: Payer: Self-pay | Admitting: Internal Medicine

## 2023-02-25 NOTE — Patient Instructions (Addendum)
Blood work was ordered.   The lab is on the first floor.    Medications changes include :   none     Return in about 6 months (around 08/27/2023) for follow up.    Health Maintenance, Female Adopting a healthy lifestyle and getting preventive care are important in promoting health and wellness. Ask your health care provider about: The right schedule for you to have regular tests and exams. Things you can do on your own to prevent diseases and keep yourself healthy. What should I know about diet, weight, and exercise? Eat a healthy diet  Eat a diet that includes plenty of vegetables, fruits, low-fat dairy products, and lean protein. Do not eat a lot of foods that are high in solid fats, added sugars, or sodium. Maintain a healthy weight Body mass index (BMI) is used to identify weight problems. It estimates body fat based on height and weight. Your health care provider can help determine your BMI and help you achieve or maintain a healthy weight. Get regular exercise Get regular exercise. This is one of the most important things you can do for your health. Most adults should: Exercise for at least 150 minutes each week. The exercise should increase your heart rate and make you sweat (moderate-intensity exercise). Do strengthening exercises at least twice a week. This is in addition to the moderate-intensity exercise. Spend less time sitting. Even light physical activity can be beneficial. Watch cholesterol and blood lipids Have your blood tested for lipids and cholesterol at 77 years of age, then have this test every 5 years. Have your cholesterol levels checked more often if: Your lipid or cholesterol levels are high. You are older than 77 years of age. You are at high risk for heart disease. What should I know about cancer screening? Depending on your health history and family history, you may need to have cancer screening at various ages. This may include screening  for: Breast cancer. Cervical cancer. Colorectal cancer. Skin cancer. Lung cancer. What should I know about heart disease, diabetes, and high blood pressure? Blood pressure and heart disease High blood pressure causes heart disease and increases the risk of stroke. This is more likely to develop in people who have high blood pressure readings or are overweight. Have your blood pressure checked: Every 3-5 years if you are 49-77 years of age. Every year if you are 38 years old or older. Diabetes Have regular diabetes screenings. This checks your fasting blood sugar level. Have the screening done: Once every three years after age 69 if you are at a normal weight and have a low risk for diabetes. More often and at a younger age if you are overweight or have a high risk for diabetes. What should I know about preventing infection? Hepatitis B If you have a higher risk for hepatitis B, you should be screened for this virus. Talk with your health care provider to find out if you are at risk for hepatitis B infection. Hepatitis C Testing is recommended for: Everyone born from [birth year] through 1965. Anyone with known risk factors for hepatitis C. Sexually transmitted infections (STIs) Get screened for STIs, including gonorrhea and chlamydia, if: You are sexually active and are younger than 77 years of age. You are older than 77 years of age and your health care provider tells you that you are at risk for this type of infection. Your sexual activity has changed since you were last screened, and you are  at increased risk for chlamydia or gonorrhea. Ask your health care provider if you are at risk. Ask your health care provider about whether you are at high risk for HIV. Your health care provider may recommend a prescription medicine to help prevent HIV infection. If you choose to take medicine to prevent HIV, you should first get tested for HIV. You should then be tested every 3 months for as long as you  are taking the medicine. Pregnancy If you are about to stop having your period (premenopausal) and you may become pregnant, seek counseling before you get pregnant. Take 400 to 800 micrograms (mcg) of folic acid every day if you become pregnant. Ask for birth control (contraception) if you want to prevent pregnancy. Osteoporosis and menopause Osteoporosis is a disease in which the bones lose minerals and strength with aging. This can result in bone fractures. If you are 17 years old or older, or if you are at risk for osteoporosis and fractures, ask your health care provider if you should: Be screened for bone loss. Take a calcium or vitamin D supplement to lower your risk of fractures. Be given hormone replacement therapy (HRT) to treat symptoms of menopause. Follow these instructions at home: Alcohol use Do not drink alcohol if: Your health care provider tells you not to drink. You are pregnant, may be pregnant, or are planning to become pregnant. If you drink alcohol: Limit how much you have to: 0-1 drink a day. Know how much alcohol is in your drink. In the [place] one drink equals one 12 oz bottle of beer (355 mL), one 5 oz glass of wine (148 mL), or one 1 oz glass of hard liquor (44 mL). Lifestyle Do not use any products that contain nicotine or tobacco. These products include cigarettes, chewing tobacco, and vaping devices, such as e-cigarettes. If you need help quitting, ask your health care provider. Do not use street drugs. Do not share needles. Ask your health care provider for help if you need support or information about quitting drugs. General instructions Schedule regular health, dental, and eye exams. Stay current with your vaccines. Tell your health care provider if: You often feel depressed. You have ever been abused or do not feel safe at home. Summary Adopting a healthy lifestyle and getting preventive care are important in promoting health and wellness. Follow your  health care provider's instructions about healthy diet, exercising, and getting tested or screened for diseases. Follow your health care provider's instructions on monitoring your cholesterol and blood pressure. This information is not intended to replace advice given to you by your health care provider. Make sure you discuss any questions you have with your health care provider. Document Revised: 09/13/2020 Document Reviewed: 09/13/2020 Elsevier Patient Education  2024 [employer].

## 2023-02-25 NOTE — Progress Notes (Unsigned)
Subjective:    Patient ID: Morgan Hancock, female    DOB: 03-06-1946, 77 y.o.   MRN: [id number]      HPI Morgan Hancock is here for a Physical exam and her chronic medical problems.   Doing well.     Medications and allergies reviewed with patient and updated if appropriate.  Current Outpatient Medications on File Prior to Visit  Medication Sig Dispense Refill   alendronate (FOSAMAX) 70 MG tablet TAKE 1 TABLET BY MOUTH EVERY 7 (SEVEN) DAYS. TAKE WITH A FULL GLASS OF WATER ON AN EMPTY STOMACH. (Patient taking differently: Take 70 mg by mouth once a week.) 12 tablet 3   aspirin 81 MG chewable tablet Chew 81 mg by mouth at bedtime.     atenolol (TENORMIN) 50 MG tablet TAKE 1 TABLET BY MOUTH EVERY DAY 90 tablet 3   BIOTIN PO Take 5,000 mcg by mouth daily.     CALCIUM-VITAMIN D PO Take 1 tablet by mouth 2 (two) times daily. 1200mg  / 1000 units     fenofibrate (TRICOR) 145 MG tablet TAKE 1 TABLET BY MOUTH EVERY DAY 90 tablet 3   hydrocortisone (ANUSOL-HC) 2.5 % rectal cream Apply 1 application topically 2 (two) times daily.     Omega-3 Fatty Acids (FISH OIL) 1000 MG CAPS Take 1 capsule by mouth daily.     pravastatin (PRAVACHOL) 80 MG tablet Take 1 tablet (80 mg total) by mouth every evening. 90 tablet 0   No current facility-administered medications on file prior to visit.    Review of Systems  Constitutional:  Negative for fever.  Eyes:  Negative for visual disturbance.  Respiratory:  Negative for cough, shortness of breath and wheezing.   Cardiovascular:  Negative for chest pain, palpitations and leg swelling.  Gastrointestinal:  Negative for abdominal pain, blood in stool, constipation and diarrhea.       No gerd  Genitourinary:  Negative for dysuria.       Urinary incontinence  Musculoskeletal:  Positive for arthralgias (fingers - not painful). Negative for back pain.  Skin:  Negative for rash.  Neurological:  Positive for dizziness and light-headedness. Negative for headaches.   Psychiatric/Behavioral:  Negative for dysphoric mood. The patient is not nervous/anxious.        Objective:   Vitals:   02/26/23 1028  BP: 134/72  Pulse: (!) 57  Temp: 98 F (36.7 C)  SpO2: 97%   Filed Weights   02/26/23 1028  Weight: 130 lb (59 kg)   Body mass index is 23.78 kg/m.  BP Readings from Last 3 Encounters:  02/26/23 134/72  12/20/22 132/60  08/24/22 118/80    Wt Readings from Last 3 Encounters:  02/26/23 130 lb (59 kg)  12/20/22 132 lb 6.4 oz (60.1 kg)  11/16/22 132 lb (59.9 kg)       Physical Exam Constitutional: She appears well-developed and well-nourished. No distress.  HENT:  Head: Normocephalic and atraumatic.  Right Ear: External ear normal. Normal ear canal and TM Left Ear: External ear normal.  Normal ear canal and TM Mouth/Throat: Oropharynx is clear and moist.  Eyes: Conjunctivae normal.  Neck: Neck supple. No tracheal deviation present. No thyromegaly present.  No carotid bruit  Cardiovascular: Normal rate, regular rhythm and normal heart sounds.   No murmur heard.  No edema. Pulmonary/Chest: Effort normal and breath sounds normal. No respiratory distress. She has no wheezes. She has no rales.  Breast: deferred   Abdominal: Soft. She exhibits no distension. There  is no tenderness.  Lymphadenopathy: She has no cervical adenopathy.  Skin: Skin is warm and dry. She is not diaphoretic.  Psychiatric: She has a normal mood and affect. Her behavior is normal.   Diabetic Foot Exam - Simple   Simple Foot Form Diabetic Foot exam was performed with the following findings: Yes 02/26/2023 12:25 PM  Visual Inspection No deformities, no ulcerations, no other skin breakdown bilaterally: Yes Sensation Testing Intact to touch and monofilament testing bilaterally: Yes Pulse Check Posterior Tibialis and Dorsalis pulse intact bilaterally: Yes Comments      Lab Results  Component Value Date   WBC 5.3 08/24/2022   HGB 13.8 08/24/2022   HCT  40.6 08/24/2022   PLT 200.0 08/24/2022   GLUCOSE 86 08/24/2022   CHOL 150 01/31/2023   TRIG 75 01/31/2023   HDL 46 01/31/2023   LDLCALC 89 01/31/2023   ALT 23 01/31/2023   AST 35 01/31/2023   NA 141 08/24/2022   K 4.4 08/24/2022   CL 105 08/24/2022   CREATININE 0.99 08/24/2022   BUN 20 08/24/2022   CO2 28 08/24/2022   TSH 1.29 08/24/2022   HGBA1C 6.3 08/24/2022   MICROALBUR 1.3 08/24/2022         Assessment & Plan:   Physical exam: Screening blood work  ordered Exercise  walking Weight  normal Substance abuse  none   Reviewed recommended immunizations.   Health Maintenance  Topic Date Due   OPHTHALMOLOGY EXAM  Never done   HEMOGLOBIN A1C  02/23/2023   COVID-19 Vaccine (4 - 2023-24 season) 03/14/2023 (Originally 01/07/2023)   INFLUENZA VACCINE  08/06/2023 (Originally 12/07/2022)   DTaP/Tdap/Td (2 - Td or Tdap) 02/26/2024 (Originally 05/30/2021)   Diabetic kidney evaluation - eGFR measurement  08/24/2023   Diabetic kidney evaluation - Urine ACR  08/24/2023   Medicare Annual Wellness (AWV)  11/16/2023   FOOT EXAM  02/26/2024   DEXA SCAN  03/03/2024   Pneumonia Vaccine 54+ Years old  Completed   Hepatitis C Screening  Completed   HPV VACCINES  Aged Out   Colonoscopy  Discontinued   Zoster Vaccines- Shingrix  Discontinued          See Problem List for Assessment and Plan of chronic medical problems.

## 2023-02-26 ENCOUNTER — Ambulatory Visit: Payer: Medicare Other | Admitting: Internal Medicine

## 2023-02-26 VITALS — BP 134/72 | HR 57 | Temp 98.0°F | Ht 62.0 in | Wt 130.0 lb

## 2023-02-26 DIAGNOSIS — E782 Mixed hyperlipidemia: Secondary | ICD-10-CM

## 2023-02-26 DIAGNOSIS — M85851 Other specified disorders of bone density and structure, right thigh: Secondary | ICD-10-CM

## 2023-02-26 DIAGNOSIS — Z0000 Encounter for general adult medical examination without abnormal findings: Secondary | ICD-10-CM

## 2023-02-26 DIAGNOSIS — E559 Vitamin D deficiency, unspecified: Secondary | ICD-10-CM | POA: Diagnosis not present

## 2023-02-26 DIAGNOSIS — M85852 Other specified disorders of bone density and structure, left thigh: Secondary | ICD-10-CM

## 2023-02-26 DIAGNOSIS — I10 Essential (primary) hypertension: Secondary | ICD-10-CM

## 2023-02-26 DIAGNOSIS — E1169 Type 2 diabetes mellitus with other specified complication: Secondary | ICD-10-CM

## 2023-02-26 DIAGNOSIS — I2510 Atherosclerotic heart disease of native coronary artery without angina pectoris: Secondary | ICD-10-CM

## 2023-02-26 LAB — CBC WITH DIFFERENTIAL/PLATELET
Basophils Absolute: 0.1 10*3/uL (ref 0.0–0.1)
Basophils Relative: 0.9 % (ref 0.0–3.0)
Eosinophils Absolute: 0.2 10*3/uL (ref 0.0–0.7)
Eosinophils Relative: 3.8 % (ref 0.0–5.0)
HCT: 41.4 % (ref 36.0–46.0)
Hemoglobin: 13.4 g/dL (ref 12.0–15.0)
Lymphocytes Relative: 28.7 % (ref 12.0–46.0)
Lymphs Abs: 1.6 10*3/uL (ref 0.7–4.0)
MCHC: 32.4 g/dL (ref 30.0–36.0)
MCV: 91.4 fL (ref 78.0–100.0)
Monocytes Absolute: 0.4 10*3/uL (ref 0.1–1.0)
Monocytes Relative: 7 % (ref 3.0–12.0)
Neutro Abs: 3.4 10*3/uL (ref 1.4–7.7)
Neutrophils Relative %: 59.6 % (ref 43.0–77.0)
Platelets: 214 10*3/uL (ref 150.0–400.0)
RBC: 4.53 Mil/uL (ref 3.87–5.11)
RDW: 14.9 % (ref 11.5–15.5)
WBC: 5.7 10*3/uL (ref 4.0–10.5)

## 2023-02-26 LAB — COMPREHENSIVE METABOLIC PANEL
ALT: 23 U/L (ref 0–35)
AST: 39 U/L — ABNORMAL HIGH (ref 0–37)
Albumin: 4.1 g/dL (ref 3.5–5.2)
Alkaline Phosphatase: 30 U/L — ABNORMAL LOW (ref 39–117)
BUN: 20 mg/dL (ref 6–23)
CO2: 27 meq/L (ref 19–32)
Calcium: 9.3 mg/dL (ref 8.4–10.5)
Chloride: 106 meq/L (ref 96–112)
Creatinine, Ser: 0.92 mg/dL (ref 0.40–1.20)
GFR: 60.16 mL/min (ref >60.00)
Glucose, Bld: 86 mg/dL (ref 70–99)
Potassium: 4.3 meq/L (ref 3.5–5.1)
Sodium: 142 meq/L (ref 135–145)
Total Bilirubin: 0.8 mg/dL (ref 0.2–1.2)
Total Protein: 7 g/dL (ref 6.0–8.3)

## 2023-02-26 LAB — LIPID PANEL
Cholesterol: 123 mg/dL (ref 0–200)
HDL: 41.9 mg/dL (ref >39.00)
LDL Cholesterol: 68 mg/dL (ref 0–99)
NonHDL: 81.01
Total CHOL/HDL Ratio: 3
Triglycerides: 66 mg/dL (ref 0.0–149.0)
VLDL: 13.2 mg/dL (ref 0.0–40.0)

## 2023-02-26 LAB — VITAMIN D 25 HYDROXY (VIT D DEFICIENCY, FRACTURES): VITD: 35.73 ng/mL (ref 30.00–100.00)

## 2023-02-26 LAB — HEMOGLOBIN A1C: Hgb A1c MFr Bld: 6.3 % (ref 4.6–6.5)

## 2023-02-26 LAB — TSH: TSH: 1.27 u[IU]/mL (ref 0.35–5.50)

## 2023-02-26 NOTE — Assessment & Plan Note (Signed)
Chronic CAC score 1418 in 2023 Following with cardiology Stress test 04/2022 negative Stressed healthy diet, regular exercise BP controlled Stressed sugar control Cmp, cbc, lipids  Lab Results  Component Value Date   LDLCALC 89 01/31/2023   Continue pravastatin 80 mg daily, fenofibrate 145 mg daily

## 2023-02-26 NOTE — Assessment & Plan Note (Signed)
Chronic Blood pressure well controlled CMP, CBC Continue atenolol 50 mg daily

## 2023-02-26 NOTE — Assessment & Plan Note (Addendum)
Chronic Taking vitamin d daily Check vitamin D level

## 2023-02-26 NOTE — Assessment & Plan Note (Signed)
Chronic DEXA  up to date - done 02/2022 - improved Fosamax started 03/2020-plan to continue for 5 years Continue calcium and vitamin D supplementation Continue regular exercise Check vitamin D level, CMP, CBC

## 2023-02-26 NOTE — Assessment & Plan Note (Signed)
Chronic Regular exercise and healthy diet encouraged Check lipid panel Continue pravastatin 80 mg daily, fenofibrate 145 mg daily

## 2023-02-26 NOTE — Assessment & Plan Note (Signed)
Chronic  Lab Results  Component Value Date   HGBA1C 6.3 08/24/2022   Sugars controlled Check A1c Continue lifestyle control Stressed regular exercise, diabetic diet

## 2023-03-13 ENCOUNTER — Other Ambulatory Visit: Payer: Self-pay | Admitting: Internal Medicine

## 2023-03-13 ENCOUNTER — Other Ambulatory Visit: Payer: Self-pay | Admitting: Cardiology

## 2023-03-29 DIAGNOSIS — D2121 Benign neoplasm of connective and other soft tissue of right lower limb, including hip: Secondary | ICD-10-CM | POA: Diagnosis not present

## 2023-03-29 DIAGNOSIS — D492 Neoplasm of unspecified behavior of bone, soft tissue, and skin: Secondary | ICD-10-CM | POA: Diagnosis not present

## 2023-04-16 ENCOUNTER — Ambulatory Visit: Payer: Medicare Other | Attending: Cardiology

## 2023-04-16 DIAGNOSIS — I2510 Atherosclerotic heart disease of native coronary artery without angina pectoris: Secondary | ICD-10-CM

## 2023-05-24 DIAGNOSIS — Z1231 Encounter for screening mammogram for malignant neoplasm of breast: Secondary | ICD-10-CM | POA: Diagnosis not present

## 2023-05-24 LAB — HM MAMMOGRAPHY

## 2023-05-25 ENCOUNTER — Telehealth: Payer: Self-pay

## 2023-05-25 DIAGNOSIS — E782 Mixed hyperlipidemia: Secondary | ICD-10-CM

## 2023-05-25 MED ORDER — EZETIMIBE 10 MG PO TABS: 10.0000 mg | ORAL_TABLET | Freq: Every day | ORAL | 1 refills | Status: DC

## 2023-05-25 NOTE — Telephone Encounter (Signed)
Patient notified of results and recommendations and agreed with plan, medication sent, lab order on file.  Patient aware to stop Pravastatin and to ensure is fasting for ordered labs.

## 2023-05-25 NOTE — Telephone Encounter (Signed)
-----   Message from Gypsy Balsam sent at 05/24/2023 12:09 PM EST ----- Lets start with Zetia 10 mg daily and then ----- Message ----- From: Heywood Bene, CMA Sent: 05/22/2023   2:32 PM EST To: Georgeanna Lea, MD  Resending message: Patient notified of results. Patient states she is having trouble with all statins she been prescribed including pravastatin(joint pain). She asking for alternative treatment. ----- Message ----- From: Georgeanna Lea, MD Sent: 04/24/2023   8:20 PM EST To: Neena Rhymes, RN  Carotic ultrasound showed up to 39% stenosis bilaterally.  Medical therapy is only mild disease

## 2023-06-01 ENCOUNTER — Encounter: Payer: Self-pay | Admitting: Internal Medicine

## 2023-06-01 DIAGNOSIS — Z1231 Encounter for screening mammogram for malignant neoplasm of breast: Secondary | ICD-10-CM | POA: Diagnosis not present

## 2023-06-14 DIAGNOSIS — L218 Other seborrheic dermatitis: Secondary | ICD-10-CM | POA: Diagnosis not present

## 2023-06-14 DIAGNOSIS — D1801 Hemangioma of skin and subcutaneous tissue: Secondary | ICD-10-CM | POA: Diagnosis not present

## 2023-06-14 DIAGNOSIS — L300 Nummular dermatitis: Secondary | ICD-10-CM | POA: Diagnosis not present

## 2023-06-14 DIAGNOSIS — L649 Androgenic alopecia, unspecified: Secondary | ICD-10-CM | POA: Diagnosis not present

## 2023-06-14 DIAGNOSIS — L853 Xerosis cutis: Secondary | ICD-10-CM | POA: Diagnosis not present

## 2023-06-14 DIAGNOSIS — L299 Pruritus, unspecified: Secondary | ICD-10-CM | POA: Diagnosis not present

## 2023-06-14 DIAGNOSIS — L814 Other melanin hyperpigmentation: Secondary | ICD-10-CM | POA: Diagnosis not present

## 2023-06-14 DIAGNOSIS — L821 Other seborrheic keratosis: Secondary | ICD-10-CM | POA: Diagnosis not present

## 2023-06-18 ENCOUNTER — Ambulatory Visit: Payer: Medicare Other | Attending: Cardiology | Admitting: Cardiology

## 2023-06-18 ENCOUNTER — Encounter: Payer: Self-pay | Admitting: Cardiology

## 2023-06-18 VITALS — BP 140/80 | HR 60 | Ht 62.0 in | Wt 138.0 lb

## 2023-06-18 DIAGNOSIS — R931 Abnormal findings on diagnostic imaging of heart and coronary circulation: Secondary | ICD-10-CM | POA: Diagnosis not present

## 2023-06-18 DIAGNOSIS — I6523 Occlusion and stenosis of bilateral carotid arteries: Secondary | ICD-10-CM | POA: Diagnosis not present

## 2023-06-18 DIAGNOSIS — I10 Essential (primary) hypertension: Secondary | ICD-10-CM | POA: Diagnosis not present

## 2023-06-18 MED ORDER — NEXLETOL 180 MG PO TABS: 1.0000 | ORAL_TABLET | Freq: Every day | ORAL | Status: DC

## 2023-06-18 MED ORDER — NEXLETOL 180 MG PO TABS: 1.0000 | ORAL_TABLET | Freq: Every day | ORAL | 6 refills | Status: DC

## 2023-06-18 NOTE — Addendum Note (Signed)
 Addended by: Shawnee Dellen D on: 06/18/2023 08:54 AM   Modules accepted: Orders

## 2023-06-18 NOTE — Progress Notes (Signed)
 Cardiology Office Note:    Date:  06/18/2023   ID:  Morgan Hancock, DOB [birth year], MRN [id number]  PCP:  Colene Dauphin, MD  Cardiologist:  Ralene Burger, MD    Referring MD: Colene Dauphin, MD   Chief Complaint  Patient presents with   Medication Management    Zetia    Dizziness    History of Present Illness:    Morgan Hancock is a 78 y.o. female past medical history significant for elevated calcium  score which is 1400 after that she had a stress test done which showed no evidence of ischemia, she was without carotic arterial disease up to 49% stenosis bilaterally.  She is intolerant to multiple cholesterol medications also have essential hypertension.  Comes today to months for follow-up overall doing well.  Denies have any chest pain tightness squeezing pressure burning chest overall she said she slows down a little bit but otherwise no symptoms that would indicate reactivation of coronary disease  Past Medical History:  Diagnosis Date   Cataract    Colon polyps 06/18/2013   Episodic lightheadedness 02/21/2021   Fatty liver    Fibrocystic breast disease 11/20/2015   Has breast tenderness on right   Hair thinning 12/18/2017   Herpes zoster without complication 09/24/2019   History of fusion of cervical spine 06/04/2019   Hx of adenomatous colonic polyps 06/18/2013   Overview:  Had colonoscopy 06/18/2013 added new dx of colon polyps and Diverticuiosis    Hyperlipidemia    Hypertension    Osteopenia after menopause 2019   Prediabetes 12/20/2017    Past Surgical History:  Procedure Laterality Date   ABDOMINAL HYSTERECTOMY     prolapse   ANTERIOR CERVICAL DECOMP/DISCECTOMY FUSION N/A 05/14/2019   Procedure: ANTERIOR CERVICAL DECOMPRESSION/DISCECTOMY FUSION CERVICAL FIVE- CERVICAL SIX, CERVICAL SIX- CERVICAL SEVEN, CERVICAL SEVEN- THORACIC ONE;  Surgeon: Yvonna Herder, MD;  Location: [facility] OR;  Service: Neurosurgery;  Laterality: N/A;  ANTERIOR CERVICAL  DECOMPRESSION/DISCECTOMY FUSION CERVICAL 5- CERVICAL 6, CERVICAL 6- CERVICAL 7, CERVICAL 7- THORACIC 1   CHOLECYSTECTOMY     COLONOSCOPY  06/18/2013   COLONOSCOPY WITH PROPOFOL   07/18/2018   Dr.Gessner   GALLBLADDER SURGERY  07/06/2012   TONSILLECTOMY  03/12/1952   TUBAL LIGATION      Current Medications: Current Meds  Medication Sig   alendronate  (FOSAMAX ) 70 MG tablet TAKE 1 TABLET BY MOUTH EVERY 7 (SEVEN) DAYS. TAKE WITH A FULL GLASS OF WATER ON AN EMPTY STOMACH. (Patient taking differently: Take 70 mg by mouth once a week.)   aspirin 81 MG chewable tablet Chew 81 mg by mouth at bedtime.   atenolol  (TENORMIN ) 50 MG tablet TAKE 1 TABLET BY MOUTH EVERY DAY (Patient taking differently: Take 50 mg by mouth daily.)   BIOTIN PO Take 5,000 mcg by mouth daily.   CALCIUM -VITAMIN D  PO Take 1 tablet by mouth 2 (two) times daily. 1200mg  / 1000 units   ezetimibe  (ZETIA ) 10 MG tablet Take 1 tablet (10 mg total) by mouth daily.   fenofibrate  (TRICOR ) 145 MG tablet TAKE 1 TABLET BY MOUTH EVERY DAY   hydrocortisone (ANUSOL-HC) 2.5 % rectal cream Apply 1 application topically 2 (two) times daily.   Omega-3 Fatty Acids (FISH OIL) 1000 MG CAPS Take 1 capsule by mouth daily.     Allergies:   Lipitor [atorvastatin] and Rosuvastatin    Social History   Socioeconomic History   Marital status: Married    Spouse name: Not on file   Number of children: Not on  file   Years of education: Not on file   Highest education level: Bachelor's degree (e.g., BA, AB, BS)  Occupational History   Not on file  Tobacco Use   Smoking status: Never   Smokeless tobacco: Never  Vaping Use   Vaping status: Never Used  Substance and Sexual Activity   Alcohol use: No   Drug use: No   Sexual activity: Not Currently  Other Topics Concern   Not on file  Social History Narrative   Married   Exercise: gardening, mows lawn, [place] [facility] [employer] [facility] [place]   No alcohol tobacco or drug use   Social Drivers of Manufacturing engineer Strain: Low Risk  (11/16/2022)   Overall Financial Resource Strain (CARDIA)    Difficulty of Paying Living Expenses: Not hard at all  Food Insecurity: No Food Insecurity (11/16/2022)   Hunger Vital Sign    Worried About Running Out of Food in the Last Year: Never true    Ran Out of Food in the Last Year: Never true  Transportation Needs: No Transportation Needs (11/16/2022)   PRAPARE - Administrator, Civil Service (Medical): No    Lack of Transportation (Non-Medical): No  Physical Activity: Sufficiently Active (11/16/2022)   Exercise Vital Sign    Days of Exercise per Week: 5 days    Minutes of Exercise per Session: 30 min  Recent Concern: Physical Activity - Insufficiently Active (08/20/2022)   Exercise Vital Sign    Days of Exercise per Week: 3 days    Minutes of Exercise per Session: 10 min  Stress: No Stress Concern Present (11/16/2022)   [employer] of Occupational Health - Occupational Stress Questionnaire    Feeling of Stress : Not at all  Social Connections: Socially Integrated (11/16/2022)   Social Connection and Isolation Panel [NHANES]    Frequency of Communication with Friends and Family: Three times a week    Frequency of Social Gatherings with Friends and Family: Three times a week    Attends Religious Services: More than 4 times per year    Active Member of Clubs or Organizations: Yes    Attends Engineer, structural: More than 4 times per year    Marital Status: Married     Family History: The patient's family history includes Diabetes in her father; Heart disease in her father; Hyperlipidemia in her brother and mother; Hypertension in her brother; Miscarriages / [place] in her maternal grandmother and paternal grandfather; Parkinson's disease in her father. There is no history of Colon cancer, Stomach cancer, Colon polyps, Esophageal cancer, or Rectal cancer. ROS:   Please see the history of present illness.    All 14  point review of systems negative except as described per history of present illness  EKGs/Labs/Other Studies Reviewed:    EKG Interpretation Date/Time:  Monday June 18 2023 08:28:34 EST Ventricular Rate:  60 PR Interval:  158 QRS Duration:  94 QT Interval:  412 QTC Calculation: 412 R Axis:   -24  Text Interpretation: Normal sinus rhythm Normal ECG When compared with ECG of 12-May-2019 13:25, No significant change was found Confirmed by Ralene Burger [phone]) on 06/18/2023 8:36:53 AM    Recent Labs: 02/26/2023: ALT 23; BUN 20; Creatinine, Ser 0.92; Hemoglobin 13.4; Platelets 214.0; Potassium 4.3; Sodium 142; TSH 1.27  Recent Lipid Panel    Component Value Date/Time   CHOL 123 02/26/2023 1124   CHOL 150 01/31/2023 0956   TRIG 66.0 02/26/2023 1124  HDL 41.90 02/26/2023 1124   HDL 46 01/31/2023 0956   CHOLHDL 3 02/26/2023 1124   VLDL 13.2 02/26/2023 1124   LDLCALC 68 02/26/2023 1124   LDLCALC 89 01/31/2023 0956    Physical Exam:    VS:  BP (!) 140/80 (BP Location: Right Arm, Patient Position: Sitting)   Pulse 60   Ht 5\' 2"  (1.575 m)   Wt 138 lb (62.6 kg)   SpO2 94%   BMI 25.24 kg/m     Wt Readings from Last 3 Encounters:  06/18/23 138 lb (62.6 kg)  02/26/23 130 lb (59 kg)  12/20/22 132 lb 6.4 oz (60.1 kg)     GEN:  Well nourished, well developed in no acute distress HEENT: Normal NECK: No JVD; No carotid bruits LYMPHATICS: No lymphadenopathy CARDIAC: RRR, no murmurs, no rubs, no gallops RESPIRATORY:  Clear to auscultation without rales, wheezing or rhonchi  ABDOMEN: Soft, non-tender, non-distended MUSCULOSKELETAL:  No edema; No deformity  SKIN: Warm and dry LOWER EXTREMITIES: no swelling NEUROLOGIC:  Alert and oriented x 3 PSYCHIATRIC:  Normal affect   ASSESSMENT:    1. Primary hypertension   2. Agatston coronary artery calcium  score of 1418,   02/2022   3. Bilateral carotid artery stenosis    PLAN:    In order of problems listed  above:  Essential hypertension blood pressure relatively well-controlled continue present management. Elevated calcium  score with negative stress test.  Completely asymptomatic she is on antiplatelet therapy which I will continue, he will be reduction of complex which is difficult because intolerance to multiple medications. Dyslipidemia intolerant to statins she did try Zetia  but developed some itching.  Will put her on Nexletol .  If that will not work then PCSK9 agent will be ordered. Bilateral carotid artery stenosis I did review a carotid ultrasound done in December up to 39% stenosis bilaterally.  Will continue present management   Medication Adjustments/Labs and Tests Ordered: Current medicines are reviewed at length with the patient today.  Concerns regarding medicines are outlined above.  Orders Placed This Encounter  Procedures   EKG 12-Lead   Medication changes: No orders of the defined types were placed in this encounter.   Signed, Manfred Seed, MD, [facility] 06/18/2023 8:47 AM    [facility]

## 2023-06-18 NOTE — Addendum Note (Signed)
 Addended by: Shawnee Dellen D on: 06/18/2023 09:05 AM   Modules accepted: Orders

## 2023-06-18 NOTE — Addendum Note (Signed)
 Addended by: Shawnee Dellen D on: 06/18/2023 08:57 AM   Modules accepted: Orders

## 2023-06-18 NOTE — Patient Instructions (Signed)
 Medication Instructions:   START: Nexletol  1 tablet daily   Lab Work: Your physician recommends that you return for lab work in: 6 weeks You need to have labs done when you are fasting.  You can come Monday through Friday 8:30 am to 12:00 pm and 1:15 to 4:30. You do not need to make an appointment as the order has already been placed. The labs you are going to have done are AST, ALT Lipids.    Testing/Procedures: None Ordered   Follow-Up: At [facility], you and your health needs are our priority.  As part of our continuing mission to provide you with exceptional heart care, we have created designated Provider Care Teams.  These Care Teams include your primary Cardiologist (physician) and Advanced Practice Providers (APPs -  Physician Assistants and Nurse Practitioners) who all work together to provide you with the care you need, when you need it.  We recommend signing up for the patient portal called "MyChart".  Sign up information is provided on this After Visit Summary.  MyChart is used to connect with patients for Virtual Visits (Telemedicine).  Patients are able to view lab/test results, encounter notes, upcoming appointments, etc.  Non-urgent messages can be sent to your provider as well.   To learn more about what you can do with MyChart, go to [email or web address].    Your next appointment:   6 month(s)  The format for your next appointment:   In Person  Provider:   Ralene Burger, MD    Other Instructions NA

## 2023-07-09 DIAGNOSIS — R7303 Prediabetes: Secondary | ICD-10-CM | POA: Diagnosis not present

## 2023-07-09 DIAGNOSIS — H2513 Age-related nuclear cataract, bilateral: Secondary | ICD-10-CM | POA: Diagnosis not present

## 2023-07-31 ENCOUNTER — Telehealth: Payer: Self-pay

## 2023-07-31 DIAGNOSIS — E782 Mixed hyperlipidemia: Secondary | ICD-10-CM | POA: Diagnosis not present

## 2023-07-31 NOTE — Telephone Encounter (Signed)
 Pt came by office stating that she thinks the Nexletol is causing Joint pain. She is going to try stopping it for a week and let us know how she is feeling. She had no further questions.

## 2023-08-01 LAB — LIPID PANEL
Chol/HDL Ratio: 4.4 ratio (ref 0.0–4.4)
Cholesterol, Total: 149 mg/dL (ref 100–199)
HDL: 34 mg/dL — ABNORMAL LOW (ref >39)
LDL Chol Calc (NIH): 96 mg/dL (ref 0–99)
Triglycerides: 99 mg/dL (ref 0–149)
VLDL Cholesterol Cal: 19 mg/dL (ref 5–40)

## 2023-08-01 LAB — ALT: ALT: 24 IU/L (ref 0–32)

## 2023-08-01 LAB — AST: AST: 52 IU/L — ABNORMAL HIGH (ref 0–40)

## 2023-08-26 NOTE — Patient Instructions (Addendum)
      Blood work was ordered.       Medications changes include :   stop telmisartan -hydrochlorothiazide  40-12.5 mg daily and start telmisartan  40 mg daily and hydrochlorothiazide  25 mg daily     Return in about 6 months (around 02/26/2024) for Physical Exam.

## 2023-08-26 NOTE — Progress Notes (Signed)
 Subjective:    Patient ID: Morgan Hancock, female    DOB: 1945/09/07, 78 y.o.   MRN: [id number]     HPI Morgan Hancock is here for follow up of her chronic medical problems.  Cardio put her on nexletol  - did have some right hip pain and stopped the medication and hip pain got a little better but then got worse -- so she will restart the medication.   Right hip pain - has pain at the base of his buttock.  She has no pain with sitting.  She has pain with walking or turning a certain way.   Walking regularly for exercise - daily.   Diet is good.    Medications and allergies reviewed with patient and updated if appropriate.  Current Outpatient Medications on File Prior to Visit  Medication Sig Dispense Refill   alendronate  (FOSAMAX ) 70 MG tablet TAKE 1 TABLET BY MOUTH EVERY 7 (SEVEN) DAYS. TAKE WITH A FULL GLASS OF WATER ON AN EMPTY STOMACH. (Patient taking differently: Take 70 mg by mouth once a week.) 12 tablet 3   aspirin 81 MG chewable tablet Chew 81 mg by mouth at bedtime.     atenolol  (TENORMIN ) 50 MG tablet TAKE 1 TABLET BY MOUTH EVERY DAY (Patient taking differently: Take 50 mg by mouth daily.) 90 tablet 3   Bempedoic Acid  (NEXLETOL ) 180 MG TABS Take 1 tablet (180 mg total) by mouth daily.     Bempedoic Acid  (NEXLETOL ) 180 MG TABS Take 1 tablet (180 mg total) by mouth daily. 30 tablet 6   BIOTIN PO Take 5,000 mcg by mouth daily.     CALCIUM -VITAMIN D  PO Take 1 tablet by mouth 2 (two) times daily. 1200mg  / 1000 units     fenofibrate  (TRICOR ) 145 MG tablet TAKE 1 TABLET BY MOUTH EVERY DAY 90 tablet 3   hydrocortisone (ANUSOL-HC) 2.5 % rectal cream Apply 1 application topically 2 (two) times daily.     Omega-3 Fatty Acids (FISH OIL) 1000 MG CAPS Take 1 capsule by mouth daily.     Cholecalciferol (D3) 25 MCG (1000 UT) capsule      No current facility-administered medications on file prior to visit.     Review of Systems  Constitutional:  Negative for fever.  Respiratory:   Negative for cough, shortness of breath and wheezing.   Cardiovascular:  Negative for chest pain, palpitations and leg swelling.  Neurological:  Positive for light-headedness (walking around). Negative for headaches.       Objective:   Vitals:   08/27/23 0918  BP: 128/74  Pulse: 67  Temp: 98 F (36.7 C)   BP Readings from Last 3 Encounters:  08/27/23 128/74  06/18/23 (!) 140/80  02/26/23 134/72   Wt Readings from Last 3 Encounters:  08/27/23 127 lb (57.6 kg)  06/18/23 138 lb (62.6 kg)  02/26/23 130 lb (59 kg)   Body mass index is 23.23 kg/m.    Physical Exam Constitutional:      General: She is not in acute distress.    Appearance: Normal appearance.  HENT:     Head: Normocephalic and atraumatic.  Eyes:     Conjunctiva/sclera: Conjunctivae normal.  Cardiovascular:     Rate and Rhythm: Normal rate and regular rhythm.     Heart sounds: Normal heart sounds.  Pulmonary:     Effort: Pulmonary effort is normal. No respiratory distress.     Breath sounds: Normal breath sounds. No wheezing.  Musculoskeletal:  Cervical back: Neck supple.     Right lower leg: No edema.     Left lower leg: No edema.  Lymphadenopathy:     Cervical: No cervical adenopathy.  Skin:    General: Skin is warm and dry.     Findings: No rash.  Neurological:     Mental Status: She is alert. Mental status is at baseline.  Psychiatric:        Mood and Affect: Mood normal.        Behavior: Behavior normal.        Lab Results  Component Value Date   WBC 5.7 02/26/2023   HGB 13.4 02/26/2023   HCT 41.4 02/26/2023   PLT 214.0 02/26/2023   GLUCOSE 86 02/26/2023   CHOL 149 07/31/2023   TRIG 99 07/31/2023   HDL 34 (L) 07/31/2023   LDLCALC 96 07/31/2023   ALT 24 07/31/2023   AST 52 (H) 07/31/2023   NA 142 02/26/2023   K 4.3 02/26/2023   CL 106 02/26/2023   CREATININE 0.92 02/26/2023   BUN 20 02/26/2023   CO2 27 02/26/2023   TSH 1.27 02/26/2023   HGBA1C 6.3 02/26/2023   MICROALBUR  1.3 08/24/2022     Assessment & Plan:    See Problem List for Assessment and Plan of chronic medical problems.

## 2023-08-27 ENCOUNTER — Encounter: Payer: Self-pay | Admitting: Internal Medicine

## 2023-08-27 ENCOUNTER — Ambulatory Visit (INDEPENDENT_AMBULATORY_CARE_PROVIDER_SITE_OTHER): Payer: Medicare Other | Admitting: Internal Medicine

## 2023-08-27 VITALS — BP 128/74 | HR 67 | Temp 98.0°F | Ht 62.0 in | Wt 127.0 lb

## 2023-08-27 DIAGNOSIS — E559 Vitamin D deficiency, unspecified: Secondary | ICD-10-CM

## 2023-08-27 DIAGNOSIS — E1169 Type 2 diabetes mellitus with other specified complication: Secondary | ICD-10-CM | POA: Diagnosis not present

## 2023-08-27 DIAGNOSIS — I10 Essential (primary) hypertension: Secondary | ICD-10-CM | POA: Diagnosis not present

## 2023-08-27 DIAGNOSIS — E782 Mixed hyperlipidemia: Secondary | ICD-10-CM

## 2023-08-27 DIAGNOSIS — I2510 Atherosclerotic heart disease of native coronary artery without angina pectoris: Secondary | ICD-10-CM | POA: Diagnosis not present

## 2023-08-27 DIAGNOSIS — M810 Age-related osteoporosis without current pathological fracture: Secondary | ICD-10-CM

## 2023-08-27 LAB — COMPREHENSIVE METABOLIC PANEL WITH GFR
ALT: 23 U/L (ref 0–35)
AST: 35 U/L (ref 0–37)
Albumin: 4.3 g/dL (ref 3.5–5.2)
Alkaline Phosphatase: 31 U/L — ABNORMAL LOW (ref 39–117)
BUN: 17 mg/dL (ref 6–23)
CO2: 28 meq/L (ref 19–32)
Calcium: 9.7 mg/dL (ref 8.4–10.5)
Chloride: 105 meq/L (ref 96–112)
Creatinine, Ser: 1.03 mg/dL (ref 0.40–1.20)
GFR: 52.35 mL/min — ABNORMAL LOW (ref >60.00)
Glucose, Bld: 96 mg/dL (ref 70–99)
Potassium: 4 meq/L (ref 3.5–5.1)
Sodium: 142 meq/L (ref 135–145)
Total Bilirubin: 0.7 mg/dL (ref 0.2–1.2)
Total Protein: 7.3 g/dL (ref 6.0–8.3)

## 2023-08-27 LAB — CBC WITH DIFFERENTIAL/PLATELET
Basophils Absolute: 0.1 10*3/uL (ref 0.0–0.1)
Basophils Relative: 1.2 % (ref 0.0–3.0)
Eosinophils Absolute: 0.2 10*3/uL (ref 0.0–0.7)
Eosinophils Relative: 3.8 % (ref 0.0–5.0)
HCT: 41.6 % (ref 36.0–46.0)
Hemoglobin: 13.7 g/dL (ref 12.0–15.0)
Lymphocytes Relative: 28.8 % (ref 12.0–46.0)
Lymphs Abs: 1.3 10*3/uL (ref 0.7–4.0)
MCHC: 32.9 g/dL (ref 30.0–36.0)
MCV: 93.3 fl (ref 78.0–100.0)
Monocytes Absolute: 0.3 10*3/uL (ref 0.1–1.0)
Monocytes Relative: 7.7 % (ref 3.0–12.0)
Neutro Abs: 2.6 10*3/uL (ref 1.4–7.7)
Neutrophils Relative %: 58.5 % (ref 43.0–77.0)
Platelets: 230 10*3/uL (ref 150.0–400.0)
RBC: 4.46 Mil/uL (ref 3.87–5.11)
RDW: 14.9 % (ref 11.5–15.5)
WBC: 4.4 10*3/uL (ref 4.0–10.5)

## 2023-08-27 LAB — MICROALBUMIN / CREATININE URINE RATIO
Creatinine,U: 86.8 mg/dL
Microalb Creat Ratio: 12.8 mg/g (ref 0.0–30.0)
Microalb, Ur: 1.1 mg/dL (ref 0.0–1.9)

## 2023-08-27 LAB — HEMOGLOBIN A1C: Hgb A1c MFr Bld: 6.1 % (ref 4.6–6.5)

## 2023-08-27 LAB — VITAMIN D 25 HYDROXY (VIT D DEFICIENCY, FRACTURES): VITD: 39.46 ng/mL (ref 30.00–100.00)

## 2023-08-27 NOTE — Assessment & Plan Note (Addendum)
 Chronic Regular exercise and healthy diet encouraged Check lipid panel Continue Nexletol  180 mg daily, fenofibrate  145 mg daily

## 2023-08-27 NOTE — Assessment & Plan Note (Signed)
 Chronic DEXA  up to date Fosamax  started 03/2020-plan to continue for 5 years Continue calcium  and vitamin D  supplementation Continue regular exercise Check vitamin D  level, CMP, CBC

## 2023-08-27 NOTE — Assessment & Plan Note (Signed)
Chronic Taking vitamin d daily Check vitamin D level

## 2023-08-27 NOTE — Assessment & Plan Note (Signed)
Chronic Blood pressure well controlled CMP, CBC Continue atenolol 50 mg daily

## 2023-08-27 NOTE — Assessment & Plan Note (Addendum)
 Chronic CAC score 1418 in 2023 Following with cardiology Stress test 04/2022 negative Stressed healthy diet, regular exercise BP controlled Stressed sugar control Cmp, cbc, lipids  Lab Results  Component Value Date   LDLCALC 96 07/31/2023   Continue fenofibrate  145 mg daily, Nexletol  180 mg daily-has been off it for a while and is not sure if this was causing pain, but plans on restarting

## 2023-08-27 NOTE — Assessment & Plan Note (Signed)
 Chronic  Lab Results  Component Value Date   HGBA1C 6.3 02/26/2023   Sugars controlled Check A1c, urine microalbumin/creatinine ratio Continue lifestyle control Stressed regular exercise, diabetic diet

## 2023-08-28 ENCOUNTER — Encounter: Payer: Self-pay | Admitting: Internal Medicine

## 2023-10-01 ENCOUNTER — Other Ambulatory Visit: Payer: Self-pay | Admitting: Internal Medicine

## 2023-10-01 ENCOUNTER — Other Ambulatory Visit: Payer: Self-pay | Admitting: Cardiology

## 2023-11-19 ENCOUNTER — Ambulatory Visit (INDEPENDENT_AMBULATORY_CARE_PROVIDER_SITE_OTHER): Payer: Medicare Other

## 2023-11-19 VITALS — Ht 62.0 in | Wt 128.0 lb

## 2023-11-19 DIAGNOSIS — Z0000 Encounter for general adult medical examination without abnormal findings: Secondary | ICD-10-CM | POA: Diagnosis not present

## 2023-11-19 NOTE — Patient Instructions (Addendum)
 Morgan Hancock , Thank you for taking time out of your busy schedule to complete your Annual Wellness Visit with me. I enjoyed our conversation and look forward to speaking with you again next year. I, as well as your care team,  appreciate your ongoing commitment to your health goals. Please review the following plan we discussed and let me know if I can assist you in the future. Your Game plan/ To Do List    Follow up Visits: Next Medicare AWV with our clinical staff: 11/19/2024   Have you seen your provider in the last 6 months (3 months if uncontrolled diabetes)? Yes Next Office Visit with your provider: 02/2025  Clinician Recommendations:  Aim for 30 minutes of exercise or brisk walking, 6-8 glasses of water, and 5 servings of fruits and vegetables each day. Educated and advised on getting the Tdap (Tetenus) vaccine in 2025.      This is a list of the screening recommended for you and due dates:  Health Maintenance  Topic Date Due   COVID-19 Vaccine (4 - 2024-25 season) 01/07/2023   DTaP/Tdap/Td vaccine (2 - Td or Tdap) 02/26/2024*   Flu Shot  12/07/2023   Complete foot exam   02/26/2024   Hemoglobin A1C  02/26/2024   DEXA scan (bone density measurement)  03/03/2024   Eye exam for diabetics  08/12/2024   Yearly kidney function blood test for diabetes  08/26/2024   Yearly kidney health urinalysis for diabetes  08/26/2024   Medicare Annual Wellness Visit  11/18/2024   Pneumococcal Vaccine for age over 27  Completed   Hepatitis C Screening  Completed   Hepatitis B Vaccine  Aged Out   HPV Vaccine  Aged Out   Meningitis B Vaccine  Aged Out   Colon Cancer Screening  Discontinued   Zoster (Shingles) Vaccine  Discontinued  *Topic was postponed. The date shown is not the original due date.    Advanced directives: (In Chart) A copy of your advanced directives are scanned into your chart should your provider ever need it. Advance Care Planning is important because it:  [x]  Makes sure you  receive the medical care that is consistent with your values, goals, and preferences  [x]  It provides guidance to your family and loved ones and reduces their decisional burden about whether or not they are making the right decisions based on your wishes.  Follow the link provided in your after visit summary or read over the paperwork we have mailed to you to help you started getting your Advance Directives in place. If you need assistance in completing these, please reach out to us  so that we can help you!

## 2023-11-19 NOTE — Progress Notes (Signed)
 Subjective:  Please attest and cosign this visit due to patients primary care provider not being in the office at the time the visit was completed.  (Pt of Dr Glade Hope)   Morgan Hancock is a 78 y.o. who presents for a Medicare Wellness preventive visit.  As a reminder, Annual Wellness Visits don't include a physical exam, and some assessments may be limited, especially if this visit is performed virtually. We may recommend an in-person follow-up visit with your provider if needed.  Visit Complete: Virtual I connected with  Morgan SQUIBB Lelon on 11/19/23 by a audio enabled telemedicine application and verified that I am speaking with the correct person using two identifiers.  Patient Location: Home  Provider Location: Office/Clinic  I discussed the limitations of evaluation and management by telemedicine. The patient expressed understanding and agreed to proceed.  Vital Signs: Because this visit was a virtual/telehealth visit, some criteria may be missing or patient reported. Any vitals not documented were not able to be obtained and vitals that have been documented are patient reported.  VideoDeclined- This patient declined Librarian, academic. Therefore the visit was completed with audio only.  Persons Participating in Visit: Patient.  AWV Questionnaire: No: Patient Medicare AWV questionnaire was not completed prior to this visit.  Cardiac Risk Factors include: advanced age (>76men, >58 women);diabetes mellitus;dyslipidemia;hypertension     Objective:    Today's Vitals   11/19/23 1010  Weight: 128 lb (58.1 kg)  Height: 5' 2 (1.575 m)   Body mass index is 23.41 kg/m.     11/19/2023   10:10 AM 11/16/2022    9:56 AM 12/21/2021   11:50 AM 11/15/2020   10:57 AM 05/12/2019    1:11 PM 09/19/2017    1:26 PM  Advanced Directives  Does Patient Have a Medical Advance Directive? Yes Yes Yes Yes Yes Yes   Type of Estate agent of  [place];Living will Healthcare Power of [place];Living will Healthcare Power of [place];Living will  Healthcare Power of [place];Living will Healthcare Power of [place];Living will  Does patient want to make changes to medical advance directive? No - Patient declined No - Patient declined No - Patient declined No - Patient declined No - Patient declined   Copy of Healthcare Power of Attorney in Chart? Yes - validated most recent copy scanned in chart (See row information) Yes - validated most recent copy scanned in chart (See row information) No - copy requested   No - copy requested      Data saved with a previous flowsheet row definition    Current Medications (verified) Outpatient Encounter Medications as of 11/19/2023  Medication Sig   alendronate  (FOSAMAX ) 70 MG tablet TAKE 1 TABLET BY MOUTH EVERY 7 (SEVEN) DAYS. TAKE WITH A FULL GLASS OF WATER ON AN EMPTY STOMACH.   aspirin 81 MG chewable tablet Chew 81 mg by mouth at bedtime.   atenolol  (TENORMIN ) 50 MG tablet TAKE 1 TABLET BY MOUTH EVERY DAY   Bempedoic Acid  (NEXLETOL ) 180 MG TABS Take 1 tablet (180 mg total) by mouth daily.   BIOTIN PO Take 5,000 mcg by mouth daily.   CALCIUM -VITAMIN D  PO Take 1 tablet by mouth 2 (two) times daily. 1200mg  / 1000 units   Cholecalciferol (D3) 25 MCG (1000 UT) capsule    fenofibrate  (TRICOR ) 145 MG tablet TAKE 1 TABLET BY MOUTH EVERY DAY   hydrocortisone (ANUSOL-HC) 2.5 % rectal cream Apply 1 application topically 2 (two) times daily.   Omega-3  Fatty Acids (FISH OIL) 1000 MG CAPS Take 1 capsule by mouth daily.   [DISCONTINUED] Bempedoic Acid  (NEXLETOL ) 180 MG TABS Take 1 tablet (180 mg total) by mouth daily.   No facility-administered encounter medications on file as of 11/19/2023.    Allergies (verified) Lipitor [atorvastatin] and Rosuvastatin    History: Past Medical History:  Diagnosis Date   Cataract    Colon polyps 06/18/2013   Episodic lightheadedness 02/21/2021   Fatty liver     Fibrocystic breast disease 11/20/2015   Has breast tenderness on right   Hair thinning 12/18/2017   Herpes zoster without complication 09/24/2019   History of fusion of cervical spine 06/04/2019   Hx of adenomatous colonic polyps 06/18/2013   Overview:  Had colonoscopy 06/18/2013 added new dx of colon polyps and Diverticuiosis    Hyperlipidemia    Hypertension    Osteopenia after menopause 2019   Prediabetes 12/20/2017   Past Surgical History:  Procedure Laterality Date   ABDOMINAL HYSTERECTOMY     prolapse   ANTERIOR CERVICAL DECOMP/DISCECTOMY FUSION N/A 05/14/2019   Procedure: ANTERIOR CERVICAL DECOMPRESSION/DISCECTOMY FUSION CERVICAL FIVE- CERVICAL SIX, CERVICAL SIX- CERVICAL SEVEN, CERVICAL SEVEN- THORACIC ONE;  Surgeon: Alix Charleston, MD;  Location: [facility] OR;  Service: Neurosurgery;  Laterality: N/A;  ANTERIOR CERVICAL DECOMPRESSION/DISCECTOMY FUSION CERVICAL 5- CERVICAL 6, CERVICAL 6- CERVICAL 7, CERVICAL 7- THORACIC 1   CHOLECYSTECTOMY     COLONOSCOPY  06/18/2013   COLONOSCOPY WITH PROPOFOL   07/18/2018   Dr.Gessner   GALLBLADDER SURGERY  07/06/2012   TONSILLECTOMY  03/12/1952   TUBAL LIGATION     Family History  Problem Relation Age of Onset   Hyperlipidemia Mother    Heart disease Father    Diabetes Father    Parkinson's disease Father    Hyperlipidemia Brother    Hypertension Brother    Miscarriages / Stillbirths Maternal Grandmother    Miscarriages / Stillbirths Paternal Grandfather    Colon cancer Neg Hx    Stomach cancer Neg Hx    Colon polyps Neg Hx    Esophageal cancer Neg Hx    Rectal cancer Neg Hx    Social History   Socioeconomic History   Marital status: Married    Spouse name: Not on file   Number of children: Not on file   Years of education: Not on file   Highest education level: Bachelor's degree (e.g., BA, AB, BS)  Occupational History   Not on file  Tobacco Use   Smoking status: Never   Smokeless tobacco: Never  Vaping Use   Vaping  status: Never Used  Substance and Sexual Activity   Alcohol use: No   Drug use: No   Sexual activity: Not Currently  Other Topics Concern   Not on file  Social History Narrative   Married   Exercise: gardening, mows lawn, [place] [facility] [employer] [facility] [place]   No alcohol tobacco or drug use   Social Drivers of Corporate investment banker Strain: Low Risk  (11/19/2023)   Overall Financial Resource Strain (CARDIA)    Difficulty of Paying Living Expenses: Not hard at all  Food Insecurity: No Food Insecurity (11/19/2023)   Hunger Vital Sign    Worried About Running Out of Food in the Last Year: Never true    Ran Out of Food in the Last Year: Never true  Transportation Needs: No Transportation Needs (11/19/2023)   PRAPARE - Administrator, Civil Service (Medical): No    Lack of Transportation (Non-Medical):  No  Physical Activity: Insufficiently Active (11/19/2023)   Exercise Vital Sign    Days of Exercise per Week: 2 days    Minutes of Exercise per Session: 30 min  Stress: No Stress Concern Present (11/19/2023)   [employer] of Occupational Health - Occupational Stress Questionnaire    Feeling of Stress: Not at all  Social Connections: Moderately Integrated (11/19/2023)   Social Connection and Isolation Panel    Frequency of Communication with Friends and Family: More than three times a week    Frequency of Social Gatherings with Friends and Family: Three times a week    Attends Religious Services: Never    Active Member of Clubs or Organizations: Yes    Attends Banker Meetings: Never    Marital Status: Married    Tobacco Counseling Counseling given: No    Clinical Intake:  Pre-visit preparation completed: Yes  Pain : No/denies pain     BMI - recorded: 23.41 Nutritional Status: BMI of 19-24  Normal Nutritional Risks: None Diabetes: Yes CBG done?: No Did pt. bring in CBG monitor from home?: No  Lab Results  Component Value Date   HGBA1C 6.1  08/27/2023   HGBA1C 6.3 02/26/2023   HGBA1C 6.3 08/24/2022     How often do you need to have someone help you when you read instructions, pamphlets, or other written materials from your doctor or pharmacy?: 1 - Never  Interpreter Needed?: No  Information entered by :: Verdie Saba, CMA   Activities of Daily Living     11/19/2023   10:13 AM  In your present state of health, do you have any difficulty performing the following activities:  Hearing? 0  Vision? 0  Difficulty concentrating or making decisions? 0  Walking or climbing stairs? 0  Dressing or bathing? 0  Doing errands, shopping? 0  Preparing Food and eating ? N  Using the Toilet? N  In the past six months, have you accidently leaked urine? Y  Comment wears a pad/depend  Do you have problems with loss of bowel control? N  Managing your Medications? N  Managing your Finances? N  Housekeeping or managing your Housekeeping? N    Patient Care Team: Geofm Glade PARAS, MD as PCP - General (Internal Medicine) Maryjo Levander LABOR, OD (Optometry) Avram Lupita BRAVO, MD as Consulting Physician (Gastroenterology) Mat Browning, MD as Consulting Physician (Obstetrics and Gynecology)  I have updated your Care Teams any recent Medical Services you may have received from other providers in the past year.     Assessment:   This is a routine wellness examination for [place] [place].  Hearing/Vision screen Hearing Screening - Comments:: Denies hearing difficulties   Vision Screening - Comments:: Wears rx glasses - up to date with routine eye exams with Dr Maryjo   Goals Addressed               This Visit's Progress     Patient Stated (pt-stated)        Patient stated she's just celebrated a birthday and plans to continue walking and staying active.         Depression Screen     11/19/2023   10:17 AM 11/16/2022    9:59 AM 08/24/2022   10:11 AM 11/15/2020   10:55 AM 02/18/2020    8:43 AM 08/19/2019   10:41 AM 06/13/2018   11:13 AM   PHQ 2/9 Scores  PHQ - 2 Score 0 0 0 0 0 0 0  PHQ- 9 Score  0 0 0        Fall Risk     11/19/2023   10:15 AM 11/16/2022    9:58 AM 08/24/2022   10:11 AM 02/22/2022    9:47 AM 11/15/2020   10:47 AM  Fall Risk   Falls in the past year? 0 0 0 0 0  Number falls in past yr: 0 0 0 0 0  Injury with Fall? 0 0 0 0 0  Risk for fall due to : No Fall Risks No Fall Risks No Fall Risks No Fall Risks No Fall Risks  Follow up Falls evaluation completed;Falls prevention discussed Falls prevention discussed Falls evaluation completed Falls evaluation completed  Falls evaluation completed      Data saved with a previous flowsheet row definition    MEDICARE RISK AT HOME:  Medicare Risk at Home Any stairs in or around the home?: Yes (outside) If so, are there any without handrails?: Yes Home free of loose throw rugs in walkways, pet beds, electrical cords, etc?: Yes Adequate lighting in your home to reduce risk of falls?: Yes Life alert?: No Use of a cane, walker or w/c?: No Grab bars in the bathroom?: No Shower chair or bench in shower?: No Elevated toilet seat or a handicapped toilet?: No  TIMED UP AND GO:  Was the test performed?  No  Cognitive Function: 6CIT completed    09/19/2017    1:39 PM  MMSE - Mini Mental State Exam  Orientation to time 5  Orientation to Place 5  Registration 3  Attention/ Calculation 5  Recall 3  Language- name 2 objects 2  Language- repeat 1  Language- follow 3 step command 3  Language- read & follow direction 1  Write a sentence 1  Copy design 1  Total score 30        11/19/2023   10:19 AM 11/16/2022   10:04 AM 08/19/2019   10:42 AM  6CIT Screen  What Year? 0 points 0 points 0 points  What month? 0 points 0 points 0 points  What time? 0 points 0 points 0 points  Count back from 20 0 points 0 points 0 points  Months in reverse 0 points 0 points 0 points  Repeat phrase 2 points 0 points 0 points  Total Score 2 points 0 points 0 points     Immunizations Immunization History  Administered Date(s) Administered   Moderna Sars-Covid-2 Vaccination 06/25/2019, 07/23/2019, 05/05/2020   PPD Test 06/08/2014, 06/10/2015   Pneumococcal Conjugate-13 08/31/2014   Pneumococcal Polysaccharide-23 05/26/2005, 05/27/2013   Tdap 05/31/2011    Screening Tests Health Maintenance  Topic Date Due   COVID-19 Vaccine (4 - 2024-25 season) 01/07/2023   DTaP/Tdap/Td (2 - Td or Tdap) 02/26/2024 (Originally 05/30/2021)   INFLUENZA VACCINE  12/07/2023   FOOT EXAM  02/26/2024   HEMOGLOBIN A1C  02/26/2024   DEXA SCAN  03/03/2024   OPHTHALMOLOGY EXAM  08/12/2024   Diabetic kidney evaluation - eGFR measurement  08/26/2024   Diabetic kidney evaluation - Urine ACR  08/26/2024   Medicare Annual Wellness (AWV)  11/18/2024   Pneumococcal Vaccine: 50+ Years  Completed   Hepatitis C Screening  Completed   Hepatitis B Vaccines  Aged Out   HPV VACCINES  Aged Out   Meningococcal B Vaccine  Aged Out   Colonoscopy  Discontinued   Zoster Vaccines- Shingrix  Discontinued    Health Maintenance  Health Maintenance Due  Topic Date Due   COVID-19 Vaccine (4 - 2024-25  season) 01/07/2023   Health Maintenance Items Addressed:11/19/2023   Additional Screening:  Vision Screening: Recommended annual ophthalmology exams for early detection of glaucoma and other disorders of the eye. Would you like a referral to an eye doctor? No    Dental Screening: Recommended annual dental exams for proper oral hygiene  Community Resource Referral / Chronic Care Management: CRR required this visit?  No   CCM required this visit?  No   Plan:    I have personally reviewed and noted the following in the patient's chart:   Medical and social history Use of alcohol, tobacco or illicit drugs  Current medications and supplements including opioid prescriptions. Patient is not currently taking opioid prescriptions. Functional ability and status Nutritional  status Physical activity Advanced directives List of other physicians Hospitalizations, surgeries, and ER visits in previous 12 months Vitals Screenings to include cognitive, depression, and falls Referrals and appointments  In addition, I have reviewed and discussed with patient certain preventive protocols, quality metrics, and best practice recommendations. A written personalized care plan for preventive services as well as general preventive health recommendations were provided to patient.   Verdie CHRISTELLA Saba, CMA   11/19/2023   After Visit Summary: (MyChart) Due to this being a telephonic visit, the after visit summary with patients personalized plan was offered to patient via MyChart   Notes: Nothing significant to report at this time.

## 2023-11-29 ENCOUNTER — Other Ambulatory Visit: Payer: Self-pay | Admitting: Cardiology

## 2023-12-25 ENCOUNTER — Encounter: Payer: Self-pay | Admitting: Cardiology

## 2023-12-25 ENCOUNTER — Ambulatory Visit: Attending: Cardiology | Admitting: Cardiology

## 2023-12-25 VITALS — BP 156/82 | HR 68 | Ht 62.0 in | Wt 129.2 lb

## 2023-12-25 DIAGNOSIS — R931 Abnormal findings on diagnostic imaging of heart and coronary circulation: Secondary | ICD-10-CM | POA: Diagnosis not present

## 2023-12-25 DIAGNOSIS — I6523 Occlusion and stenosis of bilateral carotid arteries: Secondary | ICD-10-CM | POA: Diagnosis not present

## 2023-12-25 DIAGNOSIS — E782 Mixed hyperlipidemia: Secondary | ICD-10-CM

## 2023-12-25 NOTE — Patient Instructions (Signed)
Medication Instructions:  Your physician recommends that you continue on your current medications as directed. Please refer to the Current Medication list given to you today.  *If you need a refill on your cardiac medications before your next appointment, please call your pharmacy*   Lab Work: Lipid, AST, ALT- today If you have labs (blood work) drawn today and your tests are completely normal, you will receive your results only by: [place] (if you have MyChart) OR A paper copy in the mail If you have any lab test that is abnormal or we need to change your treatment, we will call you to review the results.   Testing/Procedures: Your physician has requested that you have a carotid duplex. This test is an ultrasound of the carotid arteries in your neck. It looks at blood flow through these arteries that supply the brain with blood. Allow one hour for this exam. There are no restrictions or special instructions.    Follow-Up: At [facility], you and your health needs are our priority.  As part of our continuing mission to provide you with exceptional heart care, we have created designated Provider Care Teams.  These Care Teams include your primary Cardiologist (physician) and Advanced Practice Providers (APPs -  Physician Assistants and Nurse Practitioners) who all work together to provide you with the care you need, when you need it.  We recommend signing up for the patient portal called "MyChart".  Sign up information is provided on this After Visit Summary.  MyChart is used to connect with patients for Virtual Visits (Telemedicine).  Patients are able to view lab/test results, encounter notes, upcoming appointments, etc.  Non-urgent messages can be sent to your provider as well.   To learn more about what you can do with MyChart, go to [email or web address].    Your next appointment:   6 month(s)  The format for your next appointment:   In Person  Provider:   Jenne Campus, MD    Other Instructions NA

## 2023-12-25 NOTE — Progress Notes (Signed)
 Cardiology Office Note:    Date:  12/25/2023   ID:  Morgan Hancock, DOB 21-Apr-1946, MRN [id number]  PCP:  Geofm Glade PARAS, MD  Cardiologist:  Lamar Fitch, MD    Referring MD: Geofm Glade PARAS, MD   No chief complaint on file.   History of Present Illness:    Morgan Hancock is a 78 y.o. female past medical history significant for elevated calcium  score which was 1400 stress test done thereafter showed no evidence of ischemia, she also got bilateral up to 49% stenosis of carotic arteries intolerant to multiple cholesterol medication.  Comes today for follow-up overall she said she is doing fine but she does have the sensation of dizziness that lasts all day no abrupt onset no abrupt offset.  No chest pain tightness squeezing pressure in chest she seems to be very active and she still walks a lot with no difficulties  Past Medical History:  Diagnosis Date   Agatston coronary artery calcium  score of 1418,   02/2022 03/08/2022   Atherosclerosis of aorta (HCC) 04/29/2017   CAD (coronary artery disease) 08/23/2022   CAC score 1418  02/2022     Carotid arterial disease (HCC) 06/13/2018   B/l ICA's  1-39%   06/27/18 and 04/2022     Cataract    Colon polyps 06/18/2013   DD (diverticular disease) 06/18/2013   Overview:   New dx per colonoscopy done 06/18/2013     Diabetes (HCC) 12/20/2017   Dx 02/2022  Eye -  Ray A. Maryjo Raddle, OD     Episodic lightheadedness 02/21/2021   Fatty liver    Female bladder prolapse 02/22/2022   Fibrocystic breast disease 11/20/2015   Has breast tenderness on right   Hair thinning 12/18/2017   Herniation of nucleus pulposus of cervical intervertebral disc without myelopathy 03/21/2019   Herpes zoster without complication 09/24/2019   History of fusion of cervical spine 06/04/2019   HNP (herniated nucleus pulposus), cervical 05/14/2019   Hx of adenomatous colonic polyps 06/18/2013   Overview:  Had colonoscopy 06/18/2013 added new dx of colon polyps and  Diverticuiosis    Hyperlipidemia    Hypertension    Osteopenia after menopause 2019   Osteoporosis ( osteopenia w high frax) 10/29/2015   03/03/22: spine 1.4, RFN -1.4, LFN -1.4 ( +1.5%)      03/02/2020: Spine 0.6, R FN -1.5, LFN -1.5, FRAX 18.4%, 8.4%     01/18/18:  RFN  -1.3,  LFN  -1.4,  Spine -0.1,   15.9%, 4.5%  dexa 09/15/15 LFN -1.1     Fosamax  started 03/2020     Prediabetes 12/20/2017    Past Surgical History:  Procedure Laterality Date   ABDOMINAL HYSTERECTOMY     prolapse   ANTERIOR CERVICAL DECOMP/DISCECTOMY FUSION N/A 05/14/2019   Procedure: ANTERIOR CERVICAL DECOMPRESSION/DISCECTOMY FUSION CERVICAL FIVE- CERVICAL SIX, CERVICAL SIX- CERVICAL SEVEN, CERVICAL SEVEN- THORACIC ONE;  Surgeon: Alix Lamar, MD;  Location: [facility] OR;  Service: Neurosurgery;  Laterality: N/A;  ANTERIOR CERVICAL DECOMPRESSION/DISCECTOMY FUSION CERVICAL 5- CERVICAL 6, CERVICAL 6- CERVICAL 7, CERVICAL 7- THORACIC 1   CHOLECYSTECTOMY     COLONOSCOPY  06/18/2013   COLONOSCOPY WITH PROPOFOL   07/18/2018   Dr.Gessner   GALLBLADDER SURGERY  07/06/2012   TONSILLECTOMY  03/12/1952   TUBAL LIGATION      Current Medications: Current Meds  Medication Sig   alendronate  (FOSAMAX ) 70 MG tablet TAKE 1 TABLET BY MOUTH EVERY 7 (SEVEN) DAYS. TAKE WITH A FULL GLASS OF WATER ON  AN EMPTY STOMACH.   aspirin 81 MG chewable tablet Chew 81 mg by mouth at bedtime.   atenolol  (TENORMIN ) 50 MG tablet TAKE 1 TABLET BY MOUTH EVERY DAY   Bempedoic Acid  (NEXLETOL ) 180 MG TABS Take 1 tablet (180 mg total) by mouth daily.   BIOTIN PO Take 5,000 mcg by mouth daily.   CALCIUM -VITAMIN D  PO Take 1 tablet by mouth 2 (two) times daily. 1200mg  / 1000 units   Cholecalciferol (D3) 25 MCG (1000 UT) capsule    fenofibrate  (TRICOR ) 145 MG tablet TAKE 1 TABLET BY MOUTH EVERY DAY   Omega-3 Fatty Acids (FISH OIL) 1000 MG CAPS Take 1 capsule by mouth daily.     Allergies:   Lipitor [atorvastatin] and Rosuvastatin    Social History    Socioeconomic History   Marital status: Married    Spouse name: Not on file   Number of children: Not on file   Years of education: Not on file   Highest education level: Bachelor's degree (e.g., BA, AB, BS)  Occupational History   Not on file  Tobacco Use   Smoking status: Never   Smokeless tobacco: Never  Vaping Use   Vaping status: Never Used  Substance and Sexual Activity   Alcohol use: No   Drug use: No   Sexual activity: Not Currently  Other Topics Concern   Not on file  Social History Narrative   Married   Exercise: gardening, mows lawn, [place] [facility] [employer] [facility] [place]   No alcohol tobacco or drug use   Social Drivers of Corporate investment banker Strain: Low Risk  (11/19/2023)   Overall Financial Resource Strain (CARDIA)    Difficulty of Paying Living Expenses: Not hard at all  Food Insecurity: No Food Insecurity (11/19/2023)   Hunger Vital Sign    Worried About Running Out of Food in the Last Year: Never true    Ran Out of Food in the Last Year: Never true  Transportation Needs: No Transportation Needs (11/19/2023)   PRAPARE - Administrator, Civil Service (Medical): No    Lack of Transportation (Non-Medical): No  Physical Activity: Insufficiently Active (11/19/2023)   Exercise Vital Sign    Days of Exercise per Week: 2 days    Minutes of Exercise per Session: 30 min  Stress: No Stress Concern Present (11/19/2023)   [employer] of Occupational Health - Occupational Stress Questionnaire    Feeling of Stress: Not at all  Social Connections: Moderately Integrated (11/19/2023)   Social Connection and Isolation Panel    Frequency of Communication with Friends and Family: More than three times a week    Frequency of Social Gatherings with Friends and Family: Three times a week    Attends Religious Services: Never    Active Member of Clubs or Organizations: Yes    Attends Banker Meetings: Never    Marital Status: Married     Family  History: The patient's family history includes Diabetes in her father; Heart disease in her father; Hyperlipidemia in her brother and mother; Hypertension in her brother; Miscarriages / [place] in her maternal grandmother and paternal grandfather; Parkinson's disease in her father. There is no history of Colon cancer, Stomach cancer, Colon polyps, Esophageal cancer, or Rectal cancer. ROS:   Please see the history of present illness.    All 14 point review of systems negative except as described per history of present illness  EKGs/Labs/Other Studies Reviewed:  Recent Labs: 02/26/2023: TSH 1.27 08/27/2023: ALT 23; BUN 17; Creatinine, Ser 1.03; Hemoglobin 13.7; Platelets 230.0; Potassium 4.0; Sodium 142  Recent Lipid Panel    Component Value Date/Time   CHOL 149 07/31/2023 1422   TRIG 99 07/31/2023 1422   HDL 34 (L) 07/31/2023 1422   CHOLHDL 4.4 07/31/2023 1422   CHOLHDL 3 02/26/2023 1124   VLDL 13.2 02/26/2023 1124   LDLCALC 96 07/31/2023 1422    Physical Exam:    VS:  BP (!) 156/82   Pulse 68   Ht 5' 2 (1.575 m)   Wt 129 lb 3.2 oz (58.6 kg)   SpO2 97%   BMI 23.63 kg/m     Wt Readings from Last 3 Encounters:  12/25/23 129 lb 3.2 oz (58.6 kg)  11/19/23 128 lb (58.1 kg)  08/27/23 127 lb (57.6 kg)     GEN:  Well nourished, well developed in no acute distress HEENT: Normal NECK: No JVD; No carotid bruits LYMPHATICS: No lymphadenopathy CARDIAC: RRR, no murmurs, no rubs, no gallops RESPIRATORY:  Clear to auscultation without rales, wheezing or rhonchi  ABDOMEN: Soft, non-tender, non-distended MUSCULOSKELETAL:  No edema; No deformity  SKIN: Warm and dry LOWER EXTREMITIES: no swelling NEUROLOGIC:  Alert and oriented x 3 PSYCHIATRIC:  Normal affect   ASSESSMENT:    1. Agatston coronary artery calcium  score of 1418,   02/2022   2. Bilateral carotid artery stenosis   3. Mixed hyperlipidemia    PLAN:    In order of problems listed above:  Elevated  calcium  score stress test negative, risk factors modifications, she is on antiplatelet therapy for aspirin which I continue. Dyslipidemia difficult to control because of intolerance to statin the last time we augmented her medication she is taking Nexletol  as well as Tricor  and fish oil.  Will do fasting lipid profile. Peripheral vascular disease informed carotic arterial stenosis, we will schedule her to have carotic ultrasound. We did talk about healthy lifestyle need to exercise on a regular basis which she already has   Medication Adjustments/Labs and Tests Ordered: Current medicines are reviewed at length with the patient today.  Concerns regarding medicines are outlined above.  No orders of the defined types were placed in this encounter.  Medication changes: No orders of the defined types were placed in this encounter.   Signed, Lamar DOROTHA Fitch, MD, [facility] 12/25/2023 10:11 AM    [facility]

## 2023-12-26 LAB — ALT: ALT: 29 IU/L (ref 0–32)

## 2023-12-26 LAB — LIPID PANEL
Chol/HDL Ratio: 5.2 ratio — ABNORMAL HIGH (ref 0.0–4.4)
Cholesterol, Total: 104 mg/dL (ref 100–199)
HDL: 20 mg/dL — ABNORMAL LOW (ref >39)
LDL Chol Calc (NIH): 62 mg/dL (ref 0–99)
Triglycerides: 117 mg/dL (ref 0–149)
VLDL Cholesterol Cal: 22 mg/dL (ref 5–40)

## 2023-12-26 LAB — AST: AST: 58 IU/L — ABNORMAL HIGH (ref 0–40)

## 2023-12-29 ENCOUNTER — Other Ambulatory Visit: Payer: Self-pay | Admitting: Medical Genetics

## 2024-01-02 ENCOUNTER — Ambulatory Visit: Payer: Self-pay | Admitting: Cardiology

## 2024-01-03 ENCOUNTER — Ambulatory Visit: Attending: Cardiology

## 2024-01-03 DIAGNOSIS — I6523 Occlusion and stenosis of bilateral carotid arteries: Secondary | ICD-10-CM

## 2024-01-08 ENCOUNTER — Telehealth: Payer: Self-pay

## 2024-01-08 NOTE — Telephone Encounter (Signed)
 Left message on My Chart with normal results per Dr. Karry note. Routed to PCP.

## 2024-01-08 NOTE — Telephone Encounter (Signed)
 Left message on My Chart with Vas US  results per Dr. Karry note. Routed to PCP

## 2024-01-26 ENCOUNTER — Other Ambulatory Visit: Payer: Self-pay | Admitting: Internal Medicine

## 2024-02-09 ENCOUNTER — Other Ambulatory Visit: Payer: Self-pay | Admitting: Cardiology

## 2024-02-12 ENCOUNTER — Telehealth: Payer: Self-pay | Admitting: Cardiology

## 2024-02-12 ENCOUNTER — Other Ambulatory Visit: Payer: Self-pay

## 2024-02-12 MED ORDER — EZETIMIBE 10 MG PO TABS: 10.0000 mg | ORAL_TABLET | Freq: Every day | ORAL | 3 refills | Status: AC

## 2024-02-12 NOTE — Telephone Encounter (Signed)
 Pt c/o medication issue:  1. Name of Medication: n/a   2. How are you currently taking this medication (dosage and times per day)? N/a   3. Are you having a reaction (difficulty breathing--STAT)? N/a  4. What is your medication issue? Pt states that she was prescribed a medication that is not on her medication list and wanted to clarify the name and dosage

## 2024-02-14 ENCOUNTER — Telehealth: Payer: Self-pay | Admitting: Cardiology

## 2024-02-14 MED ORDER — NEXLETOL 180 MG PO TABS: 1.0000 | ORAL_TABLET | Freq: Every day | ORAL | 3 refills | Status: AC

## 2024-02-14 NOTE — Telephone Encounter (Signed)
 RX sent in

## 2024-02-14 NOTE — Telephone Encounter (Signed)
*  STAT* If patient is at the pharmacy, call can be transferred to refill team.   1. Which medications need to be refilled? (please list name of each medication and dose if known) Bempedoic Acid  (NEXLETOL ) 180 MG TABS    2. Would you like to learn more about the convenience, safety, & potential cost savings by using the [facility]?     3. Are you open to using the [facility] (Type [facility]. ).   4. Which pharmacy/location (including street and city if local pharmacy) is medication to be sent to? [id number] - [place], [place] - [address] [facility]    5. Do they need a 30 day or 90 day supply? 90 day

## 2024-02-15 ENCOUNTER — Telehealth: Payer: Self-pay | Admitting: Pharmacy Technician

## 2024-02-15 NOTE — Telephone Encounter (Signed)
     Pharmacy Patient Advocate Encounter   Received notification from Onbase that prior authorization for nexletol  is required/requested.   Insurance verification completed.   The patient is insured through [facility].   Per test claim: PA required; PA submitted to above mentioned insurance via Latent Key/confirmation #/EOC [facility] Status is pending

## 2024-02-15 NOTE — Telephone Encounter (Signed)
 Pharmacy Patient Advocate Encounter  Received notification from [facility] that Prior Authorization for NEXLETOL  has been APPROVED from 02/15/24 to 02/15/27   PA #/Case ID/Reference #: [id number]

## 2024-02-21 ENCOUNTER — Other Ambulatory Visit (HOSPITAL_COMMUNITY)
Admission: RE | Admit: 2024-02-21 | Discharge: 2024-02-21 | Disposition: A | Discharge status: Home / Self Care | Payer: Self-pay | Source: Ambulatory Visit | Attending: Medical Genetics | Admitting: Medical Genetics

## 2024-02-24 ENCOUNTER — Other Ambulatory Visit: Payer: Self-pay | Admitting: Internal Medicine

## 2024-02-24 DIAGNOSIS — R519 Headache, unspecified: Secondary | ICD-10-CM | POA: Diagnosis not present

## 2024-02-24 DIAGNOSIS — I10 Essential (primary) hypertension: Secondary | ICD-10-CM | POA: Diagnosis not present

## 2024-02-24 DIAGNOSIS — Z9049 Acquired absence of other specified parts of digestive tract: Secondary | ICD-10-CM | POA: Diagnosis not present

## 2024-02-24 DIAGNOSIS — K5790 Diverticulosis of intestine, part unspecified, without perforation or abscess without bleeding: Secondary | ICD-10-CM | POA: Diagnosis not present

## 2024-02-24 DIAGNOSIS — Z7982 Long term (current) use of aspirin: Secondary | ICD-10-CM | POA: Diagnosis not present

## 2024-02-24 DIAGNOSIS — Z923 Personal history of irradiation: Secondary | ICD-10-CM | POA: Diagnosis not present

## 2024-02-24 DIAGNOSIS — E785 Hyperlipidemia, unspecified: Secondary | ICD-10-CM | POA: Diagnosis not present

## 2024-02-24 DIAGNOSIS — M25511 Pain in right shoulder: Secondary | ICD-10-CM | POA: Diagnosis not present

## 2024-02-24 DIAGNOSIS — Z79899 Other long term (current) drug therapy: Secondary | ICD-10-CM | POA: Diagnosis not present

## 2024-02-24 DIAGNOSIS — G4733 Obstructive sleep apnea (adult) (pediatric): Secondary | ICD-10-CM | POA: Diagnosis not present

## 2024-02-24 DIAGNOSIS — M542 Cervicalgia: Secondary | ICD-10-CM | POA: Diagnosis not present

## 2024-03-02 DIAGNOSIS — N1831 Chronic kidney disease, stage 3a: Secondary | ICD-10-CM | POA: Insufficient documentation

## 2024-03-02 LAB — GENECONNECT MOLECULAR SCREEN: Genetic Analysis Overall Interpretation: NEGATIVE

## 2024-03-02 NOTE — Progress Notes (Unsigned)
 Subjective:    Patient ID: Morgan Hancock, female    DOB: 23-Oct-1945, 78 y.o.   MRN: [id number]      HPI Tyja is here for a Physical exam and her chronic medical problems.   Review fatty liver - Ct 2018  Ckd new   Medications and allergies reviewed with patient and updated if appropriate.  Current Outpatient Medications on File Prior to Visit  Medication Sig Dispense Refill   alendronate  (FOSAMAX ) 70 MG tablet TAKE 1 TABLET BY MOUTH EVERY 7 (SEVEN) DAYS. TAKE WITH A FULL GLASS OF WATER ON AN EMPTY STOMACH. 12 tablet 3   aspirin 81 MG chewable tablet Chew 81 mg by mouth at bedtime.     atenolol  (TENORMIN ) 50 MG tablet TAKE 1 TABLET BY MOUTH EVERY DAY 90 tablet 3   Bempedoic Acid  (NEXLETOL ) 180 MG TABS Take 1 tablet (180 mg total) by mouth daily. 90 tablet 3   BIOTIN PO Take 5,000 mcg by mouth daily.     CALCIUM -VITAMIN D  PO Take 1 tablet by mouth 2 (two) times daily. 1200mg  / 1000 units     Cholecalciferol (D3) 25 MCG (1000 UT) capsule      ezetimibe  (ZETIA ) 10 MG tablet Take 1 tablet (10 mg total) by mouth daily. 90 tablet 3   fenofibrate  (TRICOR ) 145 MG tablet TAKE 1 TABLET BY MOUTH EVERY DAY 90 tablet 3   Omega-3 Fatty Acids (FISH OIL) 1000 MG CAPS Take 1 capsule by mouth daily.     No current facility-administered medications on file prior to visit.    Review of Systems     Objective:  There were no vitals filed for this visit. There were no vitals filed for this visit. There is no height or weight on file to calculate BMI.  BP Readings from Last 3 Encounters:  12/25/23 (!) 156/82  08/27/23 128/74  06/18/23 (!) 140/80    Wt Readings from Last 3 Encounters:  12/25/23 129 lb 3.2 oz (58.6 kg)  11/19/23 128 lb (58.1 kg)  08/27/23 127 lb (57.6 kg)       Physical Exam Constitutional: She appears well-developed and well-nourished. No distress.  HENT:  Head: Normocephalic and atraumatic.  Right Ear: External ear normal. Normal ear canal and TM Left Ear:  External ear normal.  Normal ear canal and TM Mouth/Throat: Oropharynx is clear and moist.  Eyes: Conjunctivae normal.  Neck: Neck supple. No tracheal deviation present. No thyromegaly present.  No carotid bruit  Cardiovascular: Normal rate, regular rhythm and normal heart sounds.   No murmur heard.  No edema. Pulmonary/Chest: Effort normal and breath sounds normal. No respiratory distress. She has no wheezes. She has no rales.  Breast: deferred   Abdominal: Soft. She exhibits no distension. There is no tenderness.  Lymphadenopathy: She has no cervical adenopathy.  Skin: Skin is warm and dry. She is not diaphoretic.  Psychiatric: She has a normal mood and affect. Her behavior is normal.     Lab Results  Component Value Date   WBC 4.4 08/27/2023   HGB 13.7 08/27/2023   HCT 41.6 08/27/2023   PLT 230.0 08/27/2023   GLUCOSE 96 08/27/2023   CHOL 104 12/25/2023   TRIG 117 12/25/2023   HDL 20 (L) 12/25/2023   LDLCALC 62 12/25/2023   ALT 29 12/25/2023   AST 58 (H) 12/25/2023   NA 142 08/27/2023   K 4.0 08/27/2023   CL 105 08/27/2023   CREATININE 1.03 08/27/2023   BUN 17  08/27/2023   CO2 28 08/27/2023   TSH 1.27 02/26/2023   HGBA1C 6.1 08/27/2023   MICROALBUR 1.1 08/27/2023         Assessment & Plan:   Physical exam: Screening blood work  ordered Exercise   Weight   Substance abuse  none   Reviewed recommended immunizations.   Health Maintenance  Topic Date Due   DTaP/Tdap/Td (2 - Td or Tdap) 05/30/2021   Influenza Vaccine  Never done   COVID-19 Vaccine (4 - 2025-26 season) 01/07/2024   FOOT EXAM  02/26/2024   HEMOGLOBIN A1C  02/26/2024   DEXA SCAN  03/03/2024   OPHTHALMOLOGY EXAM  08/12/2024   Diabetic kidney evaluation - eGFR measurement  08/26/2024   Diabetic kidney evaluation - Urine ACR  08/26/2024   Medicare Annual Wellness (AWV)  11/18/2024   Pneumococcal Vaccine: 50+ Years  Completed   Hepatitis C Screening  Completed   Meningococcal B Vaccine  Aged  Out   Mammogram  Discontinued   Colonoscopy  Discontinued   Zoster Vaccines- Shingrix  Discontinued          See Problem List for Assessment and Plan of chronic medical problems.

## 2024-03-02 NOTE — Assessment & Plan Note (Signed)
 Chronic Associated with CAD, hld, htn  Lab Results  Component Value Date   HGBA1C 6.1 08/27/2023   Sugars controlled Check A1c  Continue lifestyle control Stressed regular exercise, diabetic diet

## 2024-03-02 NOTE — Patient Instructions (Addendum)
 Blood work was ordered.       Medications changes include :   None    Return in about 6 months (around 09/01/2024) for follow up, Schedule DEXA-[place].    Health Maintenance, Female Adopting a healthy lifestyle and getting preventive care are important in promoting health and wellness. Ask your health care provider about: The right schedule for you to have regular tests and exams. Things you can do on your own to prevent diseases and keep yourself healthy. What should I know about diet, weight, and exercise? Eat a healthy diet  Eat a diet that includes plenty of vegetables, fruits, low-fat dairy products, and lean protein. Do not eat a lot of foods that are high in solid fats, added sugars, or sodium. Maintain a healthy weight Body mass index (BMI) is used to identify weight problems. It estimates body fat based on height and weight. Your health care provider can help determine your BMI and help you achieve or maintain a healthy weight. Get regular exercise Get regular exercise. This is one of the most important things you can do for your health. Most adults should: Exercise for at least 150 minutes each week. The exercise should increase your heart rate and make you sweat (moderate-intensity exercise). Do strengthening exercises at least twice a week. This is in addition to the moderate-intensity exercise. Spend less time sitting. Even light physical activity can be beneficial. Watch cholesterol and blood lipids Have your blood tested for lipids and cholesterol at 78 years of age, then have this test every 5 years. Have your cholesterol levels checked more often if: Your lipid or cholesterol levels are high. You are older than 78 years of age. You are at high risk for heart disease. What should I know about cancer screening? Depending on your health history and family history, you may need to have cancer screening at various ages. This may include screening for: Breast  cancer. Cervical cancer. Colorectal cancer. Skin cancer. Lung cancer. What should I know about heart disease, diabetes, and high blood pressure? Blood pressure and heart disease High blood pressure causes heart disease and increases the risk of stroke. This is more likely to develop in people who have high blood pressure readings or are overweight. Have your blood pressure checked: Every 3-5 years if you are 72-80 years of age. Every year if you are 45 years old or older. Diabetes Have regular diabetes screenings. This checks your fasting blood sugar level. Have the screening done: Once every three years after age 46 if you are at a normal weight and have a low risk for diabetes. More often and at a younger age if you are overweight or have a high risk for diabetes. What should I know about preventing infection? Hepatitis B If you have a higher risk for hepatitis B, you should be screened for this virus. Talk with your health care provider to find out if you are at risk for hepatitis B infection. Hepatitis C Testing is recommended for: Everyone born from [birth year] through 1965. Anyone with known risk factors for hepatitis C. Sexually transmitted infections (STIs) Get screened for STIs, including gonorrhea and chlamydia, if: You are sexually active and are younger than 78 years of age. You are older than 78 years of age and your health care provider tells you that you are at risk for this type of infection. Your sexual activity has changed since you were last screened, and you are at increased risk for chlamydia  or gonorrhea. Ask your health care provider if you are at risk. Ask your health care provider about whether you are at high risk for HIV. Your health care provider may recommend a prescription medicine to help prevent HIV infection. If you choose to take medicine to prevent HIV, you should first get tested for HIV. You should then be tested every 3 months for as long as you are taking  the medicine. Pregnancy If you are about to stop having your period (premenopausal) and you may become pregnant, seek counseling before you get pregnant. Take 400 to 800 micrograms (mcg) of folic acid every day if you become pregnant. Ask for birth control (contraception) if you want to prevent pregnancy. Osteoporosis and menopause Osteoporosis is a disease in which the bones lose minerals and strength with aging. This can result in bone fractures. If you are 77 years old or older, or if you are at risk for osteoporosis and fractures, ask your health care provider if you should: Be screened for bone loss. Take a calcium  or vitamin D  supplement to lower your risk of fractures. Be given hormone replacement therapy (HRT) to treat symptoms of menopause. Follow these instructions at home: Alcohol use Do not drink alcohol if: Your health care provider tells you not to drink. You are pregnant, may be pregnant, or are planning to become pregnant. If you drink alcohol: Limit how much you have to: 0-1 drink a day. Know how much alcohol is in your drink. In the [place] one drink equals one 12 oz bottle of beer (355 mL), one 5 oz glass of wine (148 mL), or one 1 oz glass of hard liquor (44 mL). Lifestyle Do not use any products that contain nicotine or tobacco. These products include cigarettes, chewing tobacco, and vaping devices, such as e-cigarettes. If you need help quitting, ask your health care provider. Do not use street drugs. Do not share needles. Ask your health care provider for help if you need support or information about quitting drugs. General instructions Schedule regular health, dental, and eye exams. Stay current with your vaccines. Tell your health care provider if: You often feel depressed. You have ever been abused or do not feel safe at home. Summary Adopting a healthy lifestyle and getting preventive care are important in promoting health and wellness. Follow your health  care provider's instructions about healthy diet, exercising, and getting tested or screened for diseases. Follow your health care provider's instructions on monitoring your cholesterol and blood pressure. This information is not intended to replace advice given to you by your health care provider. Make sure you discuss any questions you have with your health care provider. Document Revised: 09/13/2020 Document Reviewed: 09/13/2020 Elsevier Patient Education  2024 [employer].

## 2024-03-03 ENCOUNTER — Encounter: Payer: Self-pay | Admitting: Internal Medicine

## 2024-03-03 ENCOUNTER — Ambulatory Visit (INDEPENDENT_AMBULATORY_CARE_PROVIDER_SITE_OTHER): Admitting: Internal Medicine

## 2024-03-03 VITALS — BP 120/80 | HR 67 | Temp 98.0°F | Ht 62.0 in | Wt 127.0 lb

## 2024-03-03 DIAGNOSIS — I152 Hypertension secondary to endocrine disorders: Secondary | ICD-10-CM | POA: Diagnosis not present

## 2024-03-03 DIAGNOSIS — E1169 Type 2 diabetes mellitus with other specified complication: Secondary | ICD-10-CM | POA: Diagnosis not present

## 2024-03-03 DIAGNOSIS — I6523 Occlusion and stenosis of bilateral carotid arteries: Secondary | ICD-10-CM | POA: Diagnosis not present

## 2024-03-03 DIAGNOSIS — I2510 Atherosclerotic heart disease of native coronary artery without angina pectoris: Secondary | ICD-10-CM

## 2024-03-03 DIAGNOSIS — M810 Age-related osteoporosis without current pathological fracture: Secondary | ICD-10-CM

## 2024-03-03 DIAGNOSIS — N1831 Chronic kidney disease, stage 3a: Secondary | ICD-10-CM | POA: Diagnosis not present

## 2024-03-03 DIAGNOSIS — Z0000 Encounter for general adult medical examination without abnormal findings: Secondary | ICD-10-CM

## 2024-03-03 DIAGNOSIS — K760 Fatty (change of) liver, not elsewhere classified: Secondary | ICD-10-CM

## 2024-03-03 DIAGNOSIS — E785 Hyperlipidemia, unspecified: Secondary | ICD-10-CM | POA: Diagnosis not present

## 2024-03-03 DIAGNOSIS — E1159 Type 2 diabetes mellitus with other circulatory complications: Secondary | ICD-10-CM | POA: Diagnosis not present

## 2024-03-03 LAB — COMPREHENSIVE METABOLIC PANEL WITH GFR
ALT: 22 U/L (ref 0–35)
AST: 42 U/L — ABNORMAL HIGH (ref 0–37)
Albumin: 3.9 g/dL (ref 3.5–5.2)
Alkaline Phosphatase: 49 U/L (ref 39–117)
BUN: 17 mg/dL (ref 6–23)
CO2: 29 meq/L (ref 19–32)
Calcium: 9.7 mg/dL (ref 8.4–10.5)
Chloride: 104 meq/L (ref 96–112)
Creatinine, Ser: 0.94 mg/dL (ref 0.40–1.20)
GFR: 58.21 mL/min — ABNORMAL LOW (ref >60.00)
Glucose, Bld: 96 mg/dL (ref 70–99)
Potassium: 4.3 meq/L (ref 3.5–5.1)
Sodium: 141 meq/L (ref 135–145)
Total Bilirubin: 0.7 mg/dL (ref 0.2–1.2)
Total Protein: 7.1 g/dL (ref 6.0–8.3)

## 2024-03-03 LAB — CBC
HCT: 38.2 % (ref 36.0–46.0)
Hemoglobin: 12.9 g/dL (ref 12.0–15.0)
MCHC: 33.6 g/dL (ref 30.0–36.0)
MCV: 92.6 fl (ref 78.0–100.0)
Platelets: 237 K/uL (ref 150.0–400.0)
RBC: 4.13 Mil/uL (ref 3.87–5.11)
RDW: 14.3 % (ref 11.5–15.5)
WBC: 4.5 K/uL (ref 4.0–10.5)

## 2024-03-03 LAB — VITAMIN D 25 HYDROXY (VIT D DEFICIENCY, FRACTURES): VITD: 49.31 ng/mL (ref 30.00–100.00)

## 2024-03-03 LAB — LIPID PANEL
Cholesterol: 133 mg/dL (ref 0–200)
HDL: 25.3 mg/dL — ABNORMAL LOW (ref >39.00)
LDL Cholesterol: 76 mg/dL (ref 0–99)
NonHDL: 107.49
Total CHOL/HDL Ratio: 5
Triglycerides: 158 mg/dL — ABNORMAL HIGH (ref 0.0–149.0)
VLDL: 31.6 mg/dL (ref 0.0–40.0)

## 2024-03-03 LAB — HEMOGLOBIN A1C: Hgb A1c MFr Bld: 5.9 % (ref 4.6–6.5)

## 2024-03-03 NOTE — Assessment & Plan Note (Addendum)
 Chronic Seen on Ct scan in 2018 Healthy diet - low sugar, avoid processed foods Does not drink any alcohol

## 2024-03-03 NOTE — Assessment & Plan Note (Signed)
Chronic DEXA due-ordered Fosamax started 03/2020-plan to continue for 5 years Continue calcium and vitamin D supplementation Continue regular exercise Check vitamin D level, CMP, CBC

## 2024-03-03 NOTE — Assessment & Plan Note (Signed)
 Chronic CAC score 1418 in 2023 Following with cardiology Stress test 04/2022 negative Stressed healthy diet, regular exercise BP controlled Stressed sugar control Cmp, lipids  Lab Results  Component Value Date   LDLCALC 62 12/25/2023   Continue fenofibrate  145 mg daily, Nexletol  180 mg daily-has been off it for a while and is not sure if this was causing pain, but plans on restarting

## 2024-03-03 NOTE — Assessment & Plan Note (Signed)
 Chronic Stage 3a, mild Cmp, cbc

## 2024-03-03 NOTE — Assessment & Plan Note (Signed)
 Chronic Minimal in b/l ICA's Continue nexletol  180 mg daily, fenofibrate  145 mg daily Stressed healthy diet, regular exercise

## 2024-03-03 NOTE — Assessment & Plan Note (Signed)
Chronic Blood pressure well controlled CMP, CBC Continue atenolol 50 mg daily

## 2024-03-03 NOTE — Assessment & Plan Note (Signed)
 Chronic Regular exercise and healthy diet encouraged Check lipid panel, cmp Continue Nexletol  180 mg daily, fenofibrate  145 mg daily

## 2024-03-06 ENCOUNTER — Ambulatory Visit: Payer: Self-pay | Admitting: Internal Medicine

## 2024-03-06 DIAGNOSIS — M810 Age-related osteoporosis without current pathological fracture: Secondary | ICD-10-CM

## 2024-03-11 ENCOUNTER — Ambulatory Visit (INDEPENDENT_AMBULATORY_CARE_PROVIDER_SITE_OTHER)
Admission: RE | Admit: 2024-03-11 | Discharge: 2024-03-11 | Disposition: A | Discharge status: Home / Self Care | Source: Ambulatory Visit | Attending: Internal Medicine | Admitting: Internal Medicine

## 2024-03-11 DIAGNOSIS — M810 Age-related osteoporosis without current pathological fracture: Secondary | ICD-10-CM | POA: Diagnosis not present

## 2024-05-19 ENCOUNTER — Telehealth: Payer: Self-pay | Admitting: Pharmacy Technician

## 2024-05-19 ENCOUNTER — Other Ambulatory Visit (HOSPITAL_COMMUNITY): Payer: Self-pay

## 2024-05-19 NOTE — Telephone Encounter (Signed)
 Pharmacy Patient Advocate Encounter   Received notification from CoverMyMeds that prior authorization for Nexletol  is required/requested.   Insurance verification completed.   The patient is insured through [employer].   Per test claim: PA required; PA submitted to above mentioned insurance via Latent Key/confirmation #/[id number] Status is pending

## 2024-05-20 NOTE — Telephone Encounter (Signed)
 Pharmacy Patient Advocate Encounter  Received notification from [facility] that Prior Authorization for Nexletol  has been APPROVED from 05/19/24 to 05/07/25   PA #/Case ID/Reference #: [id number]

## 2024-05-26 ENCOUNTER — Telehealth: Payer: Self-pay | Admitting: Cardiology

## 2024-05-26 NOTE — Telephone Encounter (Signed)
 Pt c/o medication issue:  1. Name of Medication:   Bempedoic Acid  (NEXLETOL ) 180 MG TABS    2. How are you currently taking this medication (dosage and times per day)? As written   3. Are you having a reaction (difficulty breathing--STAT)? No   4. What is your medication issue? Pt called in stating this medication is too expensive, please advise.

## 2024-05-27 ENCOUNTER — Other Ambulatory Visit (HOSPITAL_COMMUNITY): Payer: Self-pay

## 2024-05-27 ENCOUNTER — Telehealth: Payer: Self-pay | Admitting: Pharmacy Technician

## 2024-05-27 NOTE — Telephone Encounter (Signed)
 [facility] approved and given to [facility] and patient

## 2024-05-27 NOTE — Telephone Encounter (Signed)
" ° ° °  Patient Advocate Encounter   The patient was approved for a [employer] grant that will help cover the cost of nexletol  Total amount awarded, 2500.  Effective: 04/27/24 - 04/26/25   APW:[id number] ERW:[id number] Hmnle:[id number] PI:[id number] [employer] ID: [id number]   Pharmacy provided with approval and processing information. Patient informed via mychart    "

## 2024-07-08 ENCOUNTER — Ambulatory Visit: Admitting: Cardiology

## 2024-09-01 ENCOUNTER — Ambulatory Visit: Admitting: Internal Medicine

## 2024-11-19 ENCOUNTER — Ambulatory Visit
